# Patient Record
Sex: Male | Born: 1940
Health system: Southern US, Community
[De-identification: ages and names within clinical notes are randomized; demographics above are authoritative.]

## PROBLEM LIST (undated history)

## (undated) DIAGNOSIS — E781 Pure hyperglyceridemia: Secondary | ICD-10-CM

## (undated) DIAGNOSIS — Z8719 Personal history of other diseases of the digestive system: Secondary | ICD-10-CM

## (undated) DIAGNOSIS — I2510 Atherosclerotic heart disease of native coronary artery without angina pectoris: Secondary | ICD-10-CM

## (undated) DIAGNOSIS — N189 Chronic kidney disease, unspecified: Secondary | ICD-10-CM

## (undated) DIAGNOSIS — J189 Pneumonia, unspecified organism: Secondary | ICD-10-CM

## (undated) DIAGNOSIS — IMO0001 Reserved for inherently not codable concepts without codable children: Secondary | ICD-10-CM

## (undated) DIAGNOSIS — R011 Cardiac murmur, unspecified: Secondary | ICD-10-CM

## (undated) DIAGNOSIS — I10 Essential (primary) hypertension: Secondary | ICD-10-CM

## (undated) DIAGNOSIS — E785 Hyperlipidemia, unspecified: Secondary | ICD-10-CM

## (undated) DIAGNOSIS — G4730 Sleep apnea, unspecified: Secondary | ICD-10-CM

## (undated) DIAGNOSIS — I350 Nonrheumatic aortic (valve) stenosis: Secondary | ICD-10-CM

## (undated) DIAGNOSIS — E119 Type 2 diabetes mellitus without complications: Secondary | ICD-10-CM

## (undated) DIAGNOSIS — M1990 Unspecified osteoarthritis, unspecified site: Secondary | ICD-10-CM

## (undated) DIAGNOSIS — Z87442 Personal history of urinary calculi: Secondary | ICD-10-CM

## (undated) DIAGNOSIS — I739 Peripheral vascular disease, unspecified: Secondary | ICD-10-CM

## (undated) DIAGNOSIS — K219 Gastro-esophageal reflux disease without esophagitis: Secondary | ICD-10-CM

## (undated) DIAGNOSIS — I209 Angina pectoris, unspecified: Secondary | ICD-10-CM

## (undated) HISTORY — DX: Pure hyperglyceridemia: E78.1

## (undated) HISTORY — PX: CATARACT EXTRACTION: SUR2

## (undated) HISTORY — DX: Hypercalcemia: E83.52

## (undated) HISTORY — PX: OTHER SURGICAL HISTORY: SHX169

## (undated) HISTORY — DX: Nonrheumatic aortic (valve) stenosis: I35.0

## (undated) HISTORY — DX: Hyperlipidemia, unspecified: E78.5

## (undated) HISTORY — DX: Gastro-esophageal reflux disease without esophagitis: K21.9

## (undated) HISTORY — DX: Essential (primary) hypertension: I10

## (undated) HISTORY — PX: HERNIA REPAIR: SHX51

## (undated) HISTORY — DX: Sleep apnea, unspecified: G47.30

## (undated) HISTORY — PX: ROTATOR CUFF REPAIR: SHX139

## (undated) HISTORY — DX: Type 2 diabetes mellitus without complications: E11.9

## (undated) HISTORY — PX: VASECTOMY: SHX75

## (undated) HISTORY — DX: Chronic kidney disease, unspecified: N18.9

---
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
## 2004-02-06 ENCOUNTER — Ambulatory Visit (HOSPITAL_COMMUNITY): Admission: RE | Admit: 2004-02-06 | Discharge: 2004-02-06 | Payer: Self-pay | Admitting: Gastroenterology

## 2004-03-19 ENCOUNTER — Ambulatory Visit (HOSPITAL_COMMUNITY): Admission: RE | Admit: 2004-03-19 | Discharge: 2004-03-19 | Payer: Self-pay | Admitting: General Surgery

## 2006-07-08 ENCOUNTER — Encounter: Admission: RE | Admit: 2006-07-08 | Discharge: 2006-07-08 | Payer: Self-pay | Admitting: Internal Medicine

## 2007-08-12 ENCOUNTER — Ambulatory Visit (HOSPITAL_COMMUNITY): Admission: RE | Admit: 2007-08-12 | Discharge: 2007-08-12 | Payer: Self-pay | Admitting: Family Medicine

## 2008-11-05 ENCOUNTER — Encounter: Admission: RE | Admit: 2008-11-05 | Discharge: 2008-11-05 | Payer: Self-pay | Admitting: Internal Medicine

## 2010-07-09 ENCOUNTER — Encounter: Admission: RE | Admit: 2010-07-09 | Discharge: 2010-07-09 | Payer: Self-pay | Admitting: Orthopedic Surgery

## 2010-07-15 ENCOUNTER — Ambulatory Visit (HOSPITAL_BASED_OUTPATIENT_CLINIC_OR_DEPARTMENT_OTHER): Admission: RE | Admit: 2010-07-15 | Discharge: 2010-07-15 | Payer: Self-pay | Admitting: Orthopedic Surgery

## 2011-02-04 LAB — POCT I-STAT 4, (NA,K, GLUC, HGB,HCT)
Glucose, Bld: 123 mg/dL — ABNORMAL HIGH (ref 70–99)
HCT: 43 % (ref 39.0–52.0)
Sodium: 139 mEq/L (ref 135–145)

## 2011-04-09 NOTE — Op Note (Signed)
NAME:  PRAVEEN, COIA                       ACCOUNT NO.:  [id number]   MEDICAL RECORD NO.:  [id number]                   PATIENT TYPE:  AMB   LOCATION:  ENDO                                 FACILITY:  [facility]   PHYSICIAN:  Danise Edge, M.D.                DATE OF BIRTH:  1941-09-19   DATE OF PROCEDURE:  02/06/2004  DATE OF DISCHARGE:                                 OPERATIVE REPORT   PROCEDURE:  Diagnostic colonoscopy.   INDICATIONS:  Mr. Therin Vetsch is a 70 year old male, born March 02, 1941.  Mr. Zawadzki underwent his health maintenance physical examination performed  by Dr. Ike Bene.  Mr. Couper stool was guaiac positive by  digital rectal examination.  Mr. Treichler was treated for acute  diverticulitis approximately three years ago.  He has had no further bouts  of diverticulitis.  There is no personal or family history of colon cancer.  He denies gastrointestinal bleeding.   ENDOSCOPIST:  Danise Edge, M.D.   PREMEDICATION:  Versed 7.5 mg, Demerol 50 mg.   DESCRIPTION OF PROCEDURE:  After obtaining informed consent, Mr. Negro  was placed in the left lateral decubitus position.  I administered  intravenous Demerol and intravenous Versed to achieve conscious sedation for  the procedure.  The patient's blood pressure, oxygen saturation and cardiac  rhythm were monitored throughout the procedure and documented in the medical  record.   Anal inspection was normal.  Digital rectal exam revealed a nonnondular  prostate.  The Olympus adjustable pediatric colonoscope was introduced into  the rectum and advanced to the cecum.  Colonic preparation for the exam  today was excellent.   Mr. Yanik does have universal colonic diverticulosis.  Diverticula are  mostly present in the left colon.  There is no endoscopic evidence for the  presence of diverticulitis or diverticular stricture formation.   Rectum:  Normal.  Sigmoid colon and descending colon:   Normal.  Splenic flexure:  Normal.  Transverse colon:  Normal.  Hepatic flexure:  Normal.  Ascending colon:  Normal.  Cecum and ileocecal valve:  Normal.   ASSESSMENT:  1. Normal proctocolonoscopy to the cecum.  2. Universal colonic diverticulosis noted.                                              Danise Edge, M.D.   MJ/MEDQ  D:  02/06/2004  T:  02/06/2004  Job:  [id number]

## 2011-04-09 NOTE — Op Note (Signed)
NAME:  Daniel Mendez, Daniel Mendez                       ACCOUNT NO.:  [id number]   MEDICAL RECORD NO.:  [id number]                   PATIENT TYPE:  AMB   LOCATION:  DAY                                  FACILITY:  [facility]   PHYSICIAN:  Timothy E. Earlene Plater, M.D.              DATE OF BIRTH:  [birth year]   DATE OF PROCEDURE:  03/19/2004  DATE OF DISCHARGE:                                 OPERATIVE REPORT   PREOPERATIVE DIAGNOSES:  Right inguinal hernia.   POSTOPERATIVE DIAGNOSES:  Large right direct inguinal hernia.   PROCEDURE:  Operative repair of right inguinal hernia with mesh.   SURGEON:  Timothy E. Earlene Plater, M.D.   ANESTHESIA:  General.   [name] presents with a symptomatic right inguinal hernia that he  wishes to have repaired.  This was carefully discussed in the office and he  is ready to proceed.  Laboratory data is reviewed by anesthesia. The right  inguinal area is identified and a permit is signed.   The patient was taken to the operating room, placed supine, general  endotracheal anesthesia administered.  The right groin had been shaved, it  was prepped and draped in the usual fashion.  Marcaine 0.25% with  epinephrine was injected throughout for a wide field block.  An incision  made over the palpable defect of the right groin.  The ample subcutaneous  tissue dissected, bleeding points cauterized, the external oblique  identified, dissected, free, opened in line with its fibers through the  external ring. Additional Marcaine injected. The cord structures appeared  normal and were dissected from the floor of the canal which revealed a large  bulging direct inguinal hernia. The cord structures were skeletonized and  there were lipomatous structures which were carefully removed with cautery  and the cord was otherwise normal.  A large plug of mesh was placed in the  floor of the canal, this reduced the hernia. It was sutured in place with #0  Prolene and then a patch was placed over  the floor of the canal and sutured  with running 2-0 Prolene to the shelving edge of the external oblique  ligament and the transversalis and rectus sheath medially. This was carried  out above the internal ring.  The cord lay nicely, the floor was reinforced  and the procedure was complete.  The cord was replaced in its anatomic  position, the external oblique closed with a running 2-0 Vicryl, the  deep subcu 2-0 Vicryl and the skin with 4-0 Monocryl.  All counts correct,  Steri-Strips and dry sterile dressing applied.  He tolerated it well and was  awakened and taken to the recovery room in good condition. Written and  verbal instructions were given to him and his wife including Percocet #36, 5  mg.  He will be followed in the office.  Timothy E. Earlene Plater, M.D.    TED/MEDQ  D:  03/19/2004  T:  03/19/2004  Job:  [id number]   cc:   Ike Bene, M.D.  [address]  [place]  [place] [address]  Fax: [phone]

## 2011-09-02 LAB — CREATININE, SERUM: GFR calc non Af Amer: >60

## 2013-08-22 DIAGNOSIS — I350 Nonrheumatic aortic (valve) stenosis: Secondary | ICD-10-CM

## 2013-08-22 HISTORY — DX: Nonrheumatic aortic (valve) stenosis: I35.0

## 2013-09-13 ENCOUNTER — Other Ambulatory Visit (HOSPITAL_COMMUNITY): Payer: Self-pay | Admitting: Internal Medicine

## 2013-09-13 ENCOUNTER — Ambulatory Visit (HOSPITAL_COMMUNITY): Payer: Medicare Other | Attending: Internal Medicine | Admitting: Radiology

## 2013-09-13 DIAGNOSIS — E119 Type 2 diabetes mellitus without complications: Secondary | ICD-10-CM | POA: Insufficient documentation

## 2013-09-13 DIAGNOSIS — I359 Nonrheumatic aortic valve disorder, unspecified: Secondary | ICD-10-CM

## 2013-09-13 DIAGNOSIS — R011 Cardiac murmur, unspecified: Secondary | ICD-10-CM

## 2013-09-13 NOTE — Progress Notes (Signed)
Echocardiogram performed.  

## 2013-10-13 ENCOUNTER — Encounter: Payer: Self-pay | Admitting: Interventional Cardiology

## 2013-10-13 ENCOUNTER — Encounter: Payer: Self-pay | Admitting: *Deleted

## 2013-10-15 ENCOUNTER — Encounter: Payer: Self-pay | Admitting: Interventional Cardiology

## 2013-10-15 ENCOUNTER — Ambulatory Visit (INDEPENDENT_AMBULATORY_CARE_PROVIDER_SITE_OTHER): Payer: Medicare Other | Admitting: Interventional Cardiology

## 2013-10-15 ENCOUNTER — Encounter (INDEPENDENT_AMBULATORY_CARE_PROVIDER_SITE_OTHER): Payer: Self-pay

## 2013-10-15 VITALS — BP 158/80 | HR 61 | Ht 71.0 in | Wt 191.0 lb

## 2013-10-15 DIAGNOSIS — I10 Essential (primary) hypertension: Secondary | ICD-10-CM | POA: Insufficient documentation

## 2013-10-15 DIAGNOSIS — E785 Hyperlipidemia, unspecified: Secondary | ICD-10-CM

## 2013-10-15 DIAGNOSIS — R0600 Dyspnea, unspecified: Secondary | ICD-10-CM

## 2013-10-15 DIAGNOSIS — I359 Nonrheumatic aortic valve disorder, unspecified: Secondary | ICD-10-CM

## 2013-10-15 DIAGNOSIS — R0989 Other specified symptoms and signs involving the circulatory and respiratory systems: Secondary | ICD-10-CM

## 2013-10-15 DIAGNOSIS — E118 Type 2 diabetes mellitus with unspecified complications: Secondary | ICD-10-CM

## 2013-10-15 DIAGNOSIS — I350 Nonrheumatic aortic (valve) stenosis: Secondary | ICD-10-CM | POA: Insufficient documentation

## 2013-10-15 DIAGNOSIS — R0609 Other forms of dyspnea: Secondary | ICD-10-CM

## 2013-10-15 NOTE — Progress Notes (Signed)
Patient ID: Daniel Mendez, male   DOB: [birth year], 72 y.o.   MRN: [id number]   Date: 10/15/2013 ID: Daniel Mendez, DOB 16-Sep-1941, MRN [id number] PCP: Renford Dills, M.D. Reason: Murmur  ASSESSMENT;  1. Mild aortic stenosis, based upon the echocardiogram from November 2014 2. Calcified nodule on ventricular surface of the anterior mitral valve leaflet, uncertain etiology but appears benign. 3. Dyspnea on exertion 4. Hypertension 5. Hyperlipidemia 6. Diabetes mellitus, controlled  PLAN:  1. Repeat echo in one year unless symptoms. This will allow longitudinal followup of aortic stenosis and ensure stability of the nodule noted on the anterior mitral leaflet 2. Exercise treadmill test given history of exertional dyspnea, multiple risk factors including diabetes, and family history of CAD   SUBJECTIVE: Daniel Mendez is a 72 y.o. male who is referred because of a murmur and an abnormal echo report. The echo demonstrated mild aortic stenosis and also a small calcified anterior mitral leaflet nodule. The patient has no cardiopulmonary complaints with the exception of dyspnea on exertion with mild chest tightness while walking up an incline (first noted while walking to a football game at [facility] within the past 30 days). Rest led to improvement of the complaint. He has no prior history of heart disease. We performed an excise treadmill test greater than 10 years ago. He denies palpitations and syncope. Is no peripheral edema other complaints. He has never had sustained palpitations or syncope. No prior history of rheumatic fever heart murmur per   No Known Allergies  Current Outpatient Prescriptions on File Prior to Visit  Medication Sig Dispense Refill  . amLODipine (NORVASC) 10 MG tablet Take 1 tablet by mouth daily.      [name] Kitchen atorvastatin (LIPITOR) 20 MG tablet Take 1 tablet by mouth daily.      [name] Kitchen JANUVIA 50 MG tablet Take 1 tablet by mouth daily.      . metFORMIN (GLUCOPHAGE)  500 MG tablet Take 1 tablet by mouth daily.      [name] Kitchen omeprazole (PRILOSEC) 20 MG capsule Take 1 capsule by mouth daily.      [name] Kitchen telmisartan (MICARDIS) 80 MG tablet Take 1 tablet by mouth daily.       No current facility-administered medications on file prior to visit.    Past Medical History  Diagnosis Date  . Hyperglyceridemia   . Hypercalcemia   . Hyperlipidemia   . HTN (hypertension)   . Diabetes   . GERD (gastroesophageal reflux disease)   . Sleep apnea   . Kidney stone     Past Surgical History  Procedure Laterality Date  . Hernia repair b/l inguinal    . Knee arthroscopy left    . Dental implants      History   Social History  . Marital Status: Married    Spouse Name: N/A    Number of Children: N/A  . Years of Education: N/A   Occupational History  . Not on file.   Social History Main Topics  . Smoking status: Never Smoker   . Smokeless tobacco: Not on file  . Alcohol Use: Not on file  . Drug Use: Not on file  . Sexual Activity: Not on file   Other Topics Concern  . Not on file   Social History Narrative  . No narrative on file    Family History  Problem Relation Age of Onset  . CVA Mother   . Heart disease Father   . Heart disease Brother  ROS: Denies claudication. No transient neurological complaints. No history of stroke. Appetite and weight have been stable. He denies wheezing, cough, phlegm production, hemoptysis, and hematemesis.. Other systems negative for complaints.  OBJECTIVE: BP 158/80  Pulse 61  Ht 5\' 11"  (1.803 m)  Wt 191 lb (86.637 kg)  BMI 26.65 kg/m2,  General: No acute distress, healthy HEENT: normal  Neck: JVD absent. Carotids bilateral bruits transmitted from the aortic valve Chest: Faint basilar crackles Cardiac: Murmur: 2-3/6 systolic murmur left midsternal border with radiation to the right upper sternal border. No diastolic murmurs heard.. Gallop: S4. Rhythm: Regular. Other: Otherwise unremarkable Abdomen: Bruit:  Absent. Pulsation: Absent Extremities: Edema: Absent. Pulses: 2+ Neuro: Normal Psych: Normal  ECG: Normal sinus rhythm with nonspecific T wave flattening and PVCs.

## 2013-10-15 NOTE — Patient Instructions (Signed)
Your physician recommends that you continue on your current medications as directed. Please refer to the Current Medication list given to you today.  Your physician has requested that you have an exercise tolerance test. For further information please visit [email or web address] Please also follow instruction sheet, as given.  Your physician has requested that you have an echocardiogram. Echocardiography is a painless test that uses sound waves to create images of your heart. It provides your doctor with information about the size and shape of your heart and how well your heart's chambers and valves are working. This procedure takes approximately one hour. There are no restrictions for this procedure.( to be done in 1 year 2015)  Your physician wants you to follow-up in: 1 year You will receive a reminder letter in the mail two months in advance. If you don't receive a letter, please call our office to schedule the follow-up appointment.

## 2013-10-23 ENCOUNTER — Telehealth (HOSPITAL_COMMUNITY): Payer: Self-pay | Admitting: *Deleted

## 2013-10-24 ENCOUNTER — Ambulatory Visit (HOSPITAL_COMMUNITY)
Admission: RE | Admit: 2013-10-24 | Discharge: 2013-10-24 | Disposition: A | Discharge status: Home / Self Care | Payer: Medicare Other | Source: Ambulatory Visit | Attending: Cardiovascular Disease | Admitting: Cardiovascular Disease

## 2013-10-24 DIAGNOSIS — R5381 Other malaise: Secondary | ICD-10-CM | POA: Insufficient documentation

## 2013-10-24 DIAGNOSIS — R0600 Dyspnea, unspecified: Secondary | ICD-10-CM

## 2013-10-24 DIAGNOSIS — R0609 Other forms of dyspnea: Secondary | ICD-10-CM

## 2013-10-24 DIAGNOSIS — R0602 Shortness of breath: Secondary | ICD-10-CM | POA: Insufficient documentation

## 2013-10-24 DIAGNOSIS — R0989 Other specified symptoms and signs involving the circulatory and respiratory systems: Secondary | ICD-10-CM

## 2013-10-24 DIAGNOSIS — I4949 Other premature depolarization: Secondary | ICD-10-CM | POA: Insufficient documentation

## 2013-10-30 ENCOUNTER — Telehealth: Payer: Self-pay

## 2013-10-30 NOTE — Telephone Encounter (Signed)
called pt to give gxt  results.lmom. for pt to return call.

## 2013-10-30 NOTE — Telephone Encounter (Signed)
Message copied by Jarvis Newcomer on Tue Oct 30, 2013 11:56 AM ------      Message from: Verdis Prime      Created: Sat Oct 27, 2013 10:34 AM       Let him know the treadmill is borderline abnormal and I need to see him if increased symptoms or in 3 months depending on course. Call if questions. ------

## 2013-10-30 NOTE — Telephone Encounter (Signed)
Message copied by Jarvis Newcomer on Tue Oct 30, 2013  8:39 AM ------      Message from: Verdis Prime      Created: Sat Oct 27, 2013 10:34 AM       Let him know the treadmill is borderline abnormal and I need to see him if increased symptoms or in 3 months depending on course. Call if questions. ------

## 2013-10-30 NOTE — Telephone Encounter (Signed)
pt returned call. pt given gxt results.treadmill is borderline abnormal and  appt sch for 3 mo f/u.pt is to cal if experiencing chest pain, sob. pt verbalized understanding.

## 2014-01-28 ENCOUNTER — Encounter: Payer: Self-pay | Admitting: Interventional Cardiology

## 2014-01-28 ENCOUNTER — Ambulatory Visit (INDEPENDENT_AMBULATORY_CARE_PROVIDER_SITE_OTHER): Payer: Medicare Other | Admitting: Interventional Cardiology

## 2014-01-28 VITALS — BP 148/72 | HR 63 | Ht 71.0 in | Wt 191.4 lb

## 2014-01-28 DIAGNOSIS — R0609 Other forms of dyspnea: Secondary | ICD-10-CM

## 2014-01-28 DIAGNOSIS — R0600 Dyspnea, unspecified: Secondary | ICD-10-CM

## 2014-01-28 DIAGNOSIS — I359 Nonrheumatic aortic valve disorder, unspecified: Secondary | ICD-10-CM

## 2014-01-28 DIAGNOSIS — E785 Hyperlipidemia, unspecified: Secondary | ICD-10-CM

## 2014-01-28 DIAGNOSIS — R0989 Other specified symptoms and signs involving the circulatory and respiratory systems: Secondary | ICD-10-CM

## 2014-01-28 DIAGNOSIS — I209 Angina pectoris, unspecified: Secondary | ICD-10-CM

## 2014-01-28 DIAGNOSIS — I10 Essential (primary) hypertension: Secondary | ICD-10-CM

## 2014-01-28 MED ORDER — METOPROLOL SUCCINATE ER 25 MG PO TB24: 25.0000 mg | ORAL_TABLET | Freq: Every day | ORAL | Status: DC

## 2014-01-28 NOTE — Patient Instructions (Signed)
Your physician has recommended you make the following change in your medication:  1) START Metoprolol 25mg  daily. An Rx has been sent to your pharmacy  Take all other medication as prescribed  You have a follow up appt on 02/26/14 @3 :15pm

## 2014-01-28 NOTE — Progress Notes (Signed)
Patient ID: Daniel Mendez, male   DOB: [birth year], 73 y.o.   MRN: [id number]    [address], [address] [place],   [address] Phone: [phone] Fax:  [phone]  Date:  01/28/2014   ID:  Daniel Mendez, DOB 22-Jun-1941, MRN [id number]  PCP:  Kandice Hams, MD   ASSESSMENT:  1. Angina and dyspnea with late positive ETT 2. Hypertension 3. Hyperlipidemia  PLAN:  1. Metoprolol succ 25 mg  PO q day to treat possible underlying coronary disease 2. Clinical followup in 4-6 weeks to establish whether now metoprolol is helping clinical symptoms.. If we can abolish symptoms with medical therapy we will treat conservatively. Otherwise he may need coronary angiography   Daniel Mendez is a 73 y.o. male who had a late borderline positive exercise treadmill test beyond 9 minutes. He is also noted occasional left arm swelling. He denies significant chest pain and dyspnea with typical activity. He walks on his treadmill on a regular basis. There is occasional late exercise tightness on the treadmill and also with heavy physical activity. This complaint has been present for less than a year. This complaint is why the exercise treadmill test was performed.   Wt Readings from Last 3 Encounters:  01/28/14 191 lb 6.4 oz (86.818 kg)  10/15/13 191 lb (86.637 kg)     Past Medical History  Diagnosis Date  . Hyperglyceridemia   . Hypercalcemia   . Hyperlipidemia   . HTN (hypertension)   . Diabetes   . GERD (gastroesophageal reflux disease)   . Sleep apnea   . Kidney stone     Current Outpatient Prescriptions  Medication Sig Dispense Refill  . amLODipine (NORVASC) 10 MG tablet Take 1 tablet by mouth daily.      [name] Kitchen atorvastatin (LIPITOR) 20 MG tablet Take 1 tablet by mouth daily.      . chlorhexidine (PERIDEX) 0.12 % solution Use as directed in the mouth or throat as needed.       [name] Kitchen JANUVIA 50 MG tablet Take 1 tablet by mouth daily.      . metFORMIN (GLUCOPHAGE) 500 MG  tablet Take 1 tablet by mouth 4 (four) times daily.       [name] Kitchen omeprazole (PRILOSEC) 20 MG capsule Take 1 capsule by mouth every other day.       . telmisartan (MICARDIS) 80 MG tablet Take 1 tablet by mouth daily.       No current facility-administered medications for this visit.    Allergies:   No Known Allergies  Social History:  The patient  reports that he has never smoked. He does not have any smokeless tobacco history on file. He reports that he does not drink alcohol or use illicit drugs.   ROS:  Please see the history of present illness.      All other systems reviewed and negative.   OBJECTIVE: VS:  BP 148/72  Pulse 63  Ht 5\' 11"  (1.803 m)  Wt 191 lb 6.4 oz (86.818 kg)  BMI 26.71 kg/m2  SpO2 94% Well nourished, well developed, in no acute distress,  HEENT: normal Neck: JVD flat. Carotid bruit absent  Cardiac:  normal S1, S2; RRR; there is a 4-1/2 systolic  murmur Lungs:  clear to auscultation bilaterally, no wheezing, rhonchi or rales Abd: soft, nontender, no hepatomegaly Ext: Edema absent. Pulses 2+ Skin: warm and dry Neuro:  CNs 2-12 intact, no focal abnormalities noted  EKG:  Not repeated  Signed, Illene Labrador III, MD 01/28/2014 12:31 PM

## 2014-02-26 ENCOUNTER — Ambulatory Visit (INDEPENDENT_AMBULATORY_CARE_PROVIDER_SITE_OTHER): Payer: Medicare Other | Admitting: Interventional Cardiology

## 2014-02-26 ENCOUNTER — Encounter: Payer: Self-pay | Admitting: Interventional Cardiology

## 2014-02-26 VITALS — BP 156/75 | HR 64 | Ht 71.0 in | Wt 189.0 lb

## 2014-02-26 DIAGNOSIS — E785 Hyperlipidemia, unspecified: Secondary | ICD-10-CM

## 2014-02-26 DIAGNOSIS — R0989 Other specified symptoms and signs involving the circulatory and respiratory systems: Secondary | ICD-10-CM

## 2014-02-26 DIAGNOSIS — R0600 Dyspnea, unspecified: Secondary | ICD-10-CM

## 2014-02-26 DIAGNOSIS — I10 Essential (primary) hypertension: Secondary | ICD-10-CM

## 2014-02-26 DIAGNOSIS — I359 Nonrheumatic aortic valve disorder, unspecified: Secondary | ICD-10-CM

## 2014-02-26 DIAGNOSIS — R0609 Other forms of dyspnea: Secondary | ICD-10-CM

## 2014-02-26 DIAGNOSIS — R9439 Abnormal result of other cardiovascular function study: Secondary | ICD-10-CM

## 2014-02-26 NOTE — Progress Notes (Signed)
Patient ID: Daniel Mendez, male   DOB: 10/03/1941, 73 y.o.   MRN: [id number]    [address], [address] [place], [place]  [address] Phone: [phone] Fax:  [phone]  Date:  02/26/2014   ID:  Daniel Mendez, DOB 16-Dec-1940, MRN [id number]  PCP:  Kandice Hams, MD   ASSESSMENT:  1. Late abnormal exercise treadmill test 2. Mild aortic stenosis 3. Hyperlipidemia, controlled 4. Hypertension, controlled  PLAN:  1. The addition of beta blocker therapy cycle side effects. He denies dyspnea and has had no exertional chest pain. 2. Longitudinal followup of the aortic valve 3. Denies syncope, angina, and dyspnea   SUBJECTIVE: Daniel Mendez is a 73 y.o. male who is doing well. No side effects and starting low-dose beta blocker therapy. He performs vigorous activity  without complaints of shortness of breath or chest discomfort. No side effects on low-dose beta blocker therapy. He has not had syncope.  Wt Readings from Last 3 Encounters:  02/26/14 189 lb (85.73 kg)  01/28/14 191 lb 6.4 oz (86.818 kg)  10/15/13 191 lb (86.637 kg)     Past Medical History  Diagnosis Date  . Hyperglyceridemia   . Hypercalcemia   . Hyperlipidemia   . HTN (hypertension)   . Diabetes   . GERD (gastroesophageal reflux disease)   . Sleep apnea   . Kidney stone     Current Outpatient Prescriptions  Medication Sig Dispense Refill  . amLODipine (NORVASC) 10 MG tablet Take 1 tablet by mouth daily.      [name] Kitchen atorvastatin (LIPITOR) 20 MG tablet Take 1 tablet by mouth daily.      . chlorhexidine (PERIDEX) 0.12 % solution Use as directed in the mouth or throat as needed.       [name] Kitchen JANUVIA 50 MG tablet Take 1 tablet by mouth daily.      . metFORMIN (GLUCOPHAGE) 500 MG tablet Take 1 tablet by mouth 4 (four) times daily.       . metoprolol succinate (TOPROL XL) 25 MG 24 hr tablet Take 1 tablet (25 mg total) by mouth daily.  30 tablet  11  . omeprazole (PRILOSEC) 20 MG capsule Take 1 capsule by  mouth every other day.       . sildenafil (VIAGRA) 100 MG tablet Take 100 mg by mouth daily as needed for erectile dysfunction.      [name] Kitchen telmisartan (MICARDIS) 80 MG tablet Take 1 tablet by mouth daily.       No current facility-administered medications for this visit.    Allergies:   No Known Allergies  Social History:  The patient  reports that he has never smoked. He does not have any smokeless tobacco history on file. He reports that he does not drink alcohol or use illicit drugs.   ROS:  Please see the history of present illness.   No medication side effect   All other systems reviewed and negative.   OBJECTIVE: VS:  BP 156/75  Pulse 64  Ht 5\' 11"  (1.803 m)  Wt 189 lb (85.73 kg)  BMI 26.37 kg/m2 Well nourished, well developed, in no acute distress, younger than stated age 70: normal Neck: JVD flat. Carotid bruit absent  Cardiac:  normal S1, S2; RRR; no murmur Lungs:  clear to auscultation bilaterally, no wheezing, rhonchi or rales Abd: soft, nontender, no hepatomegaly Ext: Edema absent. Pulses 2+ and symmetric Skin: warm and dry Neuro:  CNs 2-12 intact, no focal abnormalities noted  EKG:  Not repeated       Signed, Illene Labrador III, MD 02/26/2014 4:18 PM

## 2014-02-26 NOTE — Patient Instructions (Signed)
Your physician recommends that you continue on your current medications as directed. Please refer to the Current Medication list given to you today.  Your physician wants you to follow-up in: 6-9 months You will receive a reminder letter in the mail two months in advance. If you don't receive a letter, please call our office to schedule the follow-up appointment.  

## 2014-05-22 ENCOUNTER — Ambulatory Visit (INDEPENDENT_AMBULATORY_CARE_PROVIDER_SITE_OTHER): Payer: Medicare Other | Admitting: Podiatry

## 2014-05-22 ENCOUNTER — Encounter: Payer: Self-pay | Admitting: Podiatry

## 2014-05-22 ENCOUNTER — Ambulatory Visit (INDEPENDENT_AMBULATORY_CARE_PROVIDER_SITE_OTHER): Payer: Medicare Other

## 2014-05-22 VITALS — BP 142/70 | HR 56 | Resp 16 | Ht 70.0 in | Wt 183.0 lb

## 2014-05-22 DIAGNOSIS — M779 Enthesopathy, unspecified: Secondary | ICD-10-CM

## 2014-05-22 DIAGNOSIS — M79609 Pain in unspecified limb: Secondary | ICD-10-CM

## 2014-05-22 NOTE — Progress Notes (Signed)
   Subjective:    Patient ID: Daniel Mendez, male    DOB: [birth year], 73 y.o.   MRN: [id number]  HPI Comments: i have left foot pain under my toes. i have had the pain for a couple of months. Its remained the same. When i wear shoes it hurts, so i wear sandals. The pain gets worse as the day goes on with enclosed shoes. i havent done anything for my foot.  Foot Pain      Review of Systems  HENT:       Hearing loss Ringing in ears   Genitourinary: Positive for frequency.  All other systems reviewed and are negative.      Objective:   Physical Exam: I have reviewed his past medical history medications allergies surgeries social history and review of systems. Pulses are strongly palpable bilateral. He has pain on palpation to the second metatarsophalangeal joint of the left foot. No other osseous abnormalities are noted other than limitation on range of motion of the first metatarsophalangeal joint left foot. Radiographic evaluation does demonstrate an elongated second metatarsal mild flexible hammertoe deformities as well as a joint space narrowing of the first metatarsophalangeal joint.        Assessment & Plan:  Assessment: Capsulitis second metatarsophalangeal joint left foot. 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  Plan: Injection second metatarsophalangeal joint with Kenalog and local anesthetic after sterile Betadine skin prep followup with him as needed discussed appropriate shoe gear stretching sizes and ice therapy as well as a carbon insert for his shoes.

## 2014-08-09 ENCOUNTER — Other Ambulatory Visit: Payer: Self-pay | Admitting: Gastroenterology

## 2014-08-26 ENCOUNTER — Ambulatory Visit (INDEPENDENT_AMBULATORY_CARE_PROVIDER_SITE_OTHER): Payer: Medicare Other | Admitting: Interventional Cardiology

## 2014-08-26 ENCOUNTER — Encounter: Payer: Self-pay | Admitting: Interventional Cardiology

## 2014-08-26 VITALS — BP 140/80 | HR 50 | Ht 71.0 in | Wt 189.4 lb

## 2014-08-26 DIAGNOSIS — I209 Angina pectoris, unspecified: Secondary | ICD-10-CM | POA: Insufficient documentation

## 2014-08-26 DIAGNOSIS — R0609 Other forms of dyspnea: Secondary | ICD-10-CM

## 2014-08-26 DIAGNOSIS — R0600 Dyspnea, unspecified: Secondary | ICD-10-CM

## 2014-08-26 DIAGNOSIS — I350 Nonrheumatic aortic (valve) stenosis: Secondary | ICD-10-CM

## 2014-08-26 DIAGNOSIS — I10 Essential (primary) hypertension: Secondary | ICD-10-CM

## 2014-08-26 NOTE — Progress Notes (Signed)
Patient ID: Daniel Mendez, male   DOB: 12-26-1940, 73 y.o.   MRN: [id number]    [address], [address] [place], [place]  [address] Phone: [phone] Fax:  [phone]  Date:  08/26/2014   ID:  DONNA SILVERMAN, DOB 01-09-1941, MRN [id number]  PCP:  Kandice Hams, MD   ASSESSMENT:  1. Mild to moderate aortic stenosis, asymptomatic 2. Angina pectoris with late positive exercise treadmill test. No worsening of symptomatology and tolerating beta blocker therapy without difficulty. 3. Hypertension, controlled 4. Diabetes mellitus, uncomplicated  PLAN:  1. Physical activity and exercise below the threshold angina 2. Cautioned to call if change in anginal pattern, dyspnea, or syncope. 3. 2-D Doppler echocardiogram in one year 4. Clinical followup in one year   SUBJECTIVE: Daniel Mendez is a 73 y.o. male was tolerating metoprolol therapy without significant side effects. He moves the dose to a.m. and now has no difficulty. He is able to the [employer] all of his chores and exercise without any significant change in tolerance. He has not had syncope, angina at rest, dyspnea on exertion, orthopnea. He denies edema.    Wt Readings from Last 3 Encounters:  08/26/14 189 lb 6.4 oz (85.911 kg)  05/22/14 183 lb (83.008 kg)  02/26/14 189 lb (85.73 kg)     Past Medical History  Diagnosis Date  . Hyperglyceridemia   . Hypercalcemia   . Hyperlipidemia   . HTN (hypertension)   . Diabetes   . GERD (gastroesophageal reflux disease)   . Sleep apnea   . Kidney stone     Current Outpatient Prescriptions  Medication Sig Dispense Refill  . amLODipine (NORVASC) 10 MG tablet Take 1 tablet by mouth daily.      [name] Kitchen atorvastatin (LIPITOR) 20 MG tablet Take 1 tablet by mouth daily.      Hinda Kehr 30 MG/ACT SOLN       . chlorhexidine (PERIDEX) 0.12 % solution Use as directed in the mouth or throat as needed.       . cyclobenzaprine (FLEXERIL) 5 MG tablet Take 5 mg by mouth 3 (three) times  daily as needed for muscle spasms.      [name] Kitchen GAVILYTE-N WITH FLAVOR PACK 420 G solution       . JANUVIA 50 MG tablet Take 1 tablet by mouth daily.      . metFORMIN (GLUCOPHAGE) 500 MG tablet Take 1 tablet by mouth 4 (four) times daily.       . metoprolol succinate (TOPROL XL) 25 MG 24 hr tablet Take 1 tablet (25 mg total) by mouth daily.  30 tablet  11  . omeprazole (PRILOSEC) 20 MG capsule Take 1 capsule by mouth every other day.       . sildenafil (VIAGRA) 100 MG tablet Take 100 mg by mouth daily as needed for erectile dysfunction.      [name] Kitchen telmisartan (MICARDIS) 80 MG tablet Take 1 tablet by mouth daily.       No current facility-administered medications for this visit.    Allergies:   No Known Allergies  Social History:  The patient  reports that he has never smoked. He does not have any smokeless tobacco history on file. He reports that he does not drink alcohol or use illicit drugs.   ROS:  Please see the history of present illness.   No claudication, no transient neurological symptoms, denies palpitations.   All other systems reviewed and negative.   OBJECTIVE: VS:  BP 140/80  Pulse 50  Ht 5\' 11"  (1.803 m)  Wt 189 lb 6.4 oz (85.911 kg)  BMI 26.43 kg/m2 Well nourished, well developed, in no acute distress, compatible with age 38: normal Neck: JVD flat. Carotid bruit absent  Cardiac:  normal S1, S2; RRR; no murmur Lungs:  clear to auscultation bilaterally, no wheezing, rhonchi or rales Abd: soft, nontender, no hepatomegaly Ext: Edema absent. Pulses 2+ Skin: warm and dry Neuro:  CNs 2-12 intact, no focal abnormalities noted  EKG:  Not performed       Signed, Illene Labrador III, MD 08/26/2014 9:08 AM

## 2014-08-26 NOTE — Patient Instructions (Signed)
Your physician recommends that you continue on your current medications as directed. Please refer to the Current Medication list given to you today.  Your physician has requested that you have an echocardiogram. Echocardiography is a painless test that uses sound waves to create images of your heart. It provides your doctor with information about the size and shape of your heart and how well your heart's chambers and valves are working. This procedure takes approximately one hour. There are no restrictions for this procedure. ( To be scheduled in Sept 2016)  Physical  activity as tolerated. Call the office is you are having increased episodes of chest discomfort  Your physician wants you to follow-up in: 1 year You will receive a reminder letter in the mail two months in advance. If you don't receive a letter, please call our office to schedule the follow-up appointment.

## 2014-09-23 ENCOUNTER — Other Ambulatory Visit (HOSPITAL_COMMUNITY): Payer: Medicare Other

## 2014-09-25 ENCOUNTER — Ambulatory Visit
Admission: RE | Admit: 2014-09-25 | Discharge: 2014-09-25 | Disposition: A | Discharge status: Home / Self Care | Payer: Medicare Other | Source: Ambulatory Visit | Attending: Internal Medicine | Admitting: Internal Medicine

## 2014-09-25 ENCOUNTER — Other Ambulatory Visit: Payer: Self-pay | Admitting: Internal Medicine

## 2014-11-01 ENCOUNTER — Encounter (HOSPITAL_COMMUNITY): Payer: Self-pay | Admitting: *Deleted

## 2014-11-04 ENCOUNTER — Other Ambulatory Visit: Payer: Self-pay | Admitting: Gastroenterology

## 2014-11-19 ENCOUNTER — Ambulatory Visit (HOSPITAL_COMMUNITY): Payer: Medicare Other | Admitting: Anesthesiology

## 2014-11-19 ENCOUNTER — Encounter (HOSPITAL_COMMUNITY)
Admission: RE | Disposition: A | Discharge status: Home / Self Care | Payer: Self-pay | Source: Ambulatory Visit | Attending: Gastroenterology

## 2014-11-19 ENCOUNTER — Ambulatory Visit (HOSPITAL_COMMUNITY)
Admission: RE | Admit: 2014-11-19 | Discharge: 2014-11-19 | Disposition: A | Discharge status: Home / Self Care | Payer: Medicare Other | Source: Ambulatory Visit | Attending: Gastroenterology | Admitting: Gastroenterology

## 2014-11-19 ENCOUNTER — Encounter (HOSPITAL_COMMUNITY): Payer: Self-pay | Admitting: *Deleted

## 2014-11-19 DIAGNOSIS — D127 Benign neoplasm of rectosigmoid junction: Secondary | ICD-10-CM | POA: Insufficient documentation

## 2014-11-19 DIAGNOSIS — K219 Gastro-esophageal reflux disease without esophagitis: Secondary | ICD-10-CM | POA: Insufficient documentation

## 2014-11-19 DIAGNOSIS — Z87442 Personal history of urinary calculi: Secondary | ICD-10-CM | POA: Insufficient documentation

## 2014-11-19 DIAGNOSIS — K5730 Diverticulosis of large intestine without perforation or abscess without bleeding: Secondary | ICD-10-CM | POA: Diagnosis not present

## 2014-11-19 DIAGNOSIS — I10 Essential (primary) hypertension: Secondary | ICD-10-CM | POA: Insufficient documentation

## 2014-11-19 DIAGNOSIS — Z1211 Encounter for screening for malignant neoplasm of colon: Secondary | ICD-10-CM | POA: Insufficient documentation

## 2014-11-19 DIAGNOSIS — E780 Pure hypercholesterolemia: Secondary | ICD-10-CM | POA: Diagnosis not present

## 2014-11-19 DIAGNOSIS — G4733 Obstructive sleep apnea (adult) (pediatric): Secondary | ICD-10-CM | POA: Diagnosis not present

## 2014-11-19 DIAGNOSIS — E119 Type 2 diabetes mellitus without complications: Secondary | ICD-10-CM | POA: Diagnosis not present

## 2014-11-19 HISTORY — PX: COLONOSCOPY WITH PROPOFOL: SHX5780

## 2014-11-19 LAB — GLUCOSE, CAPILLARY: GLUCOSE-CAPILLARY: 140 mg/dL — AB (ref 70–99)

## 2014-11-19 SURGERY — COLONOSCOPY WITH PROPOFOL
Anesthesia: Monitor Anesthesia Care

## 2014-11-19 MED ORDER — KETAMINE HCL 10 MG/ML IJ SOLN
INTRAMUSCULAR | Status: DC | PRN
  Administered 2014-11-19: 20 mg via INTRAVENOUS

## 2014-11-19 MED ORDER — GLYCOPYRROLATE 0.2 MG/ML IJ SOLN
INTRAMUSCULAR | Status: AC
  Filled 2014-11-19: qty 1

## 2014-11-19 MED ORDER — ONDANSETRON HCL 4 MG/2ML IJ SOLN
INTRAMUSCULAR | Status: DC | PRN
  Administered 2014-11-19: 4 mg via INTRAVENOUS

## 2014-11-19 MED ORDER — ONDANSETRON HCL 4 MG/2ML IJ SOLN
INTRAMUSCULAR | Status: AC
  Filled 2014-11-19: qty 2

## 2014-11-19 MED ORDER — LACTATED RINGERS IV SOLN
INTRAVENOUS | Status: DC
  Administered 2014-11-19: 11:00:00 via INTRAVENOUS

## 2014-11-19 MED ORDER — FENTANYL CITRATE 0.05 MG/ML IJ SOLN: 25.0000 ug | INTRAMUSCULAR | Status: DC | PRN

## 2014-11-19 MED ORDER — GLYCOPYRROLATE 0.2 MG/ML IJ SOLN
INTRAMUSCULAR | Status: DC | PRN
  Administered 2014-11-19: 0.2 mg via INTRAVENOUS

## 2014-11-19 MED ORDER — PROPOFOL INFUSION 10 MG/ML OPTIME
INTRAVENOUS | Status: DC | PRN
  Administered 2014-11-19: 300 ug/kg/min via INTRAVENOUS

## 2014-11-19 MED ORDER — SODIUM CHLORIDE 0.9 % IV SOLN: INTRAVENOUS | Status: DC

## 2014-11-19 MED ORDER — LIDOCAINE HCL (CARDIAC) 20 MG/ML IV SOLN
INTRAVENOUS | Status: AC
  Filled 2014-11-19: qty 5

## 2014-11-19 MED ORDER — LIDOCAINE HCL (CARDIAC) 20 MG/ML IV SOLN
INTRAVENOUS | Status: DC | PRN
  Administered 2014-11-19: 100 mg via INTRAVENOUS

## 2014-11-19 MED ORDER — PROPOFOL 10 MG/ML IV BOLUS
INTRAVENOUS | Status: AC
  Filled 2014-11-19: qty 20

## 2014-11-19 SURGICAL SUPPLY — 22 items

## 2014-11-19 NOTE — Op Note (Signed)
Procedure: Screening colonoscopy. Normal screening colonoscopy performed on 02/06/2004  Endoscopist: Earle Gell  Premedication: Propofol administered by anesthesia  Procedure: The patient was placed in the left lateral decubitus position. Anal inspection and digital rectal exam were normal. The Pentax pediatric colonoscope was introduced into the rectum and advanced to the cecum. A normal-appearing appendiceal orifice was identified. A normal-appearing prominent ileocecal valve was identified. Colonic preparation for the exam today was good. Withdrawal time was 8 minutes  Universal colonic diverticulosis was present  Rectum. From the mid rectum, a 5 mm sessile polyp was removed with the cold snare and a 3 mm sessile polyp was removed with the cold biopsy forceps. Retroflexed view of the distal rectum was normal.  Sigmoid colon and descending colon. Normal  Splenic flexure. Normal  Transverse colon. Normal  Hepatic flexure. Normal  Ascending colon. Normal  Cecum and ileocecal valve. Normal.  Assessment:  #1. Universal colonic diverticulosis  #2.Two small polyps were removed from the mid rectum  Recommendation: I will review the rectal polyp pathology to determine if the patient should undergo a surveillance colonoscopy in 5 years

## 2014-11-19 NOTE — Anesthesia Postprocedure Evaluation (Signed)
  Anesthesia Post-op Note  Patient: Daniel Mendez  Procedure(s) Performed: Procedure(s) (LRB): COLONOSCOPY WITH PROPOFOL (N/A)  Patient Location: PACU  Anesthesia Type: MAC  Level of Consciousness: awake and alert   Airway and Oxygen Therapy: Patient Spontanous Breathing  Post-op Pain: mild  Post-op Assessment: Post-op Vital signs reviewed, Patient's Cardiovascular Status Stable, Respiratory Function Stable, Patent Airway and No signs of Nausea or vomiting  Last Vitals:  Filed Vitals:   11/19/14 1120  BP: 143/81  Pulse: 59  Temp:   Resp: 18    Post-op Vital Signs: stable   Complications: No apparent anesthesia complications

## 2014-11-19 NOTE — Anesthesia Preprocedure Evaluation (Addendum)
Anesthesia Evaluation  Patient identified by MRN, date of birth, ID band Patient awake    Reviewed: Allergy & Precautions, H&P , NPO status , Patient's Chart, lab work & pertinent test results, reviewed documented beta blocker date and time   Airway Mallampati: III  TM Distance: >3 FB Neck ROM: full    Dental  (+) Edentulous Upper, Edentulous Lower, Dental Advisory Given   Pulmonary shortness of breath and with exertion, sleep apnea ,  breath sounds clear to auscultation  Pulmonary exam normal       Cardiovascular Exercise Tolerance: Poor hypertension, Pt. on medications and Pt. on home beta blockers + angina + Valvular Problems/Murmurs AS Rhythm:regular Rate:Normal  Moderate AS   Neuro/Psych negative neurological ROS  negative psych ROS   GI/Hepatic negative GI ROS, Neg liver ROS, GERD-  Medicated and Controlled,  Endo/Other  diabetes, Well Controlled, Type 2, Oral Hypoglycemic Agents  Renal/GU negative Renal ROS  negative genitourinary   Musculoskeletal   Abdominal   Peds  Hematology negative hematology ROS (+)   Anesthesia Other Findings   Reproductive/Obstetrics negative OB ROS                            Anesthesia Physical Anesthesia Plan  ASA: III  Anesthesia Plan: MAC   Post-op Pain Management:    Induction:   Airway Management Planned: Simple Face Mask  Additional Equipment:   Intra-op Plan:   Post-operative Plan:   Informed Consent: I have reviewed the patients History and Physical, chart, labs and discussed the procedure including the risks, benefits and alternatives for the proposed anesthesia with the patient or authorized representative who has indicated his/her understanding and acceptance.   Dental Advisory Given  Plan Discussed with: CRNA and Surgeon  Anesthesia Plan Comments:         Anesthesia Quick Evaluation

## 2014-11-19 NOTE — H&P (Signed)
  Procedure: Repeat screening colonoscopy. Normal screening colonoscopy performed on 02/06/2004. Brother diagnosed with colon cancer at age 75  History: The patient is a 73 year old male born [birth year]. He is scheduled to undergo a screening colonoscopy with polypectomy to prevent colon cancer.  Past medical history: Bilateral inguinal herniorrhaphies. Left knee arthroscopy. Type 2 diabetes mellitus. Hypertension. Hypercholesterolemia. Obstructive sleep apnea syndrome. Hypogonadism. Kidney stones. Aortic valve stenosis.  Medication allergies: None  Exam: The patient is alert and lying comfortably on the endoscopy stretcher. Abdomen is soft and nontender to palpation. Lungs are clear to auscultation. Cardiac exam reveals a regular rhythm. Patient has aortic valve stenosis which is monitored by his cardiologist, Dr. Daneen Schick  Plan: Proceed with screening colonoscopy

## 2014-11-19 NOTE — Transfer of Care (Signed)
Immediate Anesthesia Transfer of Care Note  Patient: Daniel Mendez  Procedure(s) Performed: Procedure(s) (LRB): COLONOSCOPY WITH PROPOFOL (N/A)  Patient Location: PACU  Anesthesia Type: MAC  Level of Consciousness: sedated, patient cooperative and responds to stimulation  Airway & Oxygen Therapy: Patient Spontanous Breathing and Patient connected to face mask oxgen  Post-op Assessment: Report given to PACU RN and Post -op Vital signs reviewed and stable  Post vital signs: Reviewed and stable  Complications: No apparent anesthesia complications

## 2014-11-20 ENCOUNTER — Encounter (HOSPITAL_COMMUNITY): Payer: Self-pay | Admitting: Gastroenterology

## 2015-01-19 ENCOUNTER — Other Ambulatory Visit: Payer: Self-pay | Admitting: Interventional Cardiology

## 2015-02-11 ENCOUNTER — Ambulatory Visit (INDEPENDENT_AMBULATORY_CARE_PROVIDER_SITE_OTHER): Payer: Medicare Other | Admitting: Cardiology

## 2015-02-11 ENCOUNTER — Encounter: Payer: Self-pay | Admitting: Cardiology

## 2015-02-11 VITALS — BP 162/88 | HR 63 | Ht 71.0 in | Wt 186.1 lb

## 2015-02-11 DIAGNOSIS — R0989 Other specified symptoms and signs involving the circulatory and respiratory systems: Secondary | ICD-10-CM | POA: Insufficient documentation

## 2015-02-11 DIAGNOSIS — I10 Essential (primary) hypertension: Secondary | ICD-10-CM

## 2015-02-11 MED ORDER — HYDROCHLOROTHIAZIDE 12.5 MG PO CAPS: 12.5000 mg | ORAL_CAPSULE | Freq: Every day | ORAL | Status: DC

## 2015-02-11 NOTE — Assessment & Plan Note (Signed)
He is to see Dr Delfina Redwood this week

## 2015-02-11 NOTE — Assessment & Plan Note (Signed)
Mild to moderate by echo Oct 2014 with a MV nodule

## 2015-02-11 NOTE — Patient Instructions (Signed)
START TAKING  HCTZ 12.5 MG ONCE A DAY    Your physician has requested that you have a carotid duplex. This test is an ultrasound of the carotid arteries in your neck. It looks at blood flow through these arteries that supply the brain with blood. Allow one hour for this exam. There are no restrictions or special instructions.

## 2015-02-11 NOTE — Assessment & Plan Note (Signed)
Followed by Dr Delfina Redwood

## 2015-02-11 NOTE — Assessment & Plan Note (Signed)
No history of stroke or TIA

## 2015-02-11 NOTE — Progress Notes (Signed)
02/11/2015 Daniel Mendez   22-Sep-1941  [id number]  Primary Physician Kandice Hams, MD Primary Cardiologist: Dr [name]  HPI:  74 y.o. Male, referred to Dr [name] Nov 2014 by his PCP because of a murmur and an abnormal echo report. The echo demonstrated mild aortic stenosis and also a small calcified anterior mitral leaflet nodule. The patient has no cardiopulmonary complaints with the exception of dyspnea on exertion with mild chest tightness while walking on a treadmill. He usually does 20 minutes. He had a borderline abnormal treadmill in Dec 2014 but had done well on medical Rx. He is here today for 6 month check. He says Dr Delfina Redwood took him off Toprol secondary to fatigue. The pt admits he gets a little SOB by 20 minutes on the treadmill. He has had some chest tightness doing yard work but it is quickly relieved with rest. Overall he did not indicate a serious progression in his symptoms to me.    Current Outpatient Prescriptions  Medication Sig Dispense Refill  . amLODipine (NORVASC) 10 MG tablet Take 1 tablet by mouth at bedtime.     [name] Kitchen aspirin 81 MG chewable tablet Chew 81 mg by mouth daily.    [name] Kitchen atorvastatin (LIPITOR) 20 MG tablet Take 1 tablet by mouth daily.    Hinda Kehr 30 MG/ACT SOLN Apply 1 application topically daily.     . chlorhexidine (PERIDEX) 0.12 % solution Use as directed 10 mLs in the mouth or throat 2 (two) times daily as needed (Oral Pain).     . cyclobenzaprine (FLEXERIL) 5 MG tablet Take 5 mg by mouth 3 (three) times daily as needed for muscle spasms.    [name] Kitchen JANUVIA 100 MG tablet Take 100 mg by mouth daily.    . metFORMIN (GLUCOPHAGE) 500 MG tablet Take 1 tablet by mouth 4 (four) times daily.     [name] Kitchen omeprazole (PRILOSEC) 20 MG capsule Take 1 capsule by mouth every other day.     . sildenafil (VIAGRA) 100 MG tablet Take 100 mg by mouth daily as needed for erectile dysfunction.    [name] Kitchen telmisartan (MICARDIS) 80 MG tablet Take 1 tablet by mouth at bedtime.      No  current facility-administered medications for this visit.    No Known Allergies  History   Social History  . Marital Status: Married    Spouse Name: N/A  . Number of Children: N/A  . Years of Education: N/A   Occupational History  . Not on file.   Social History Main Topics  . Smoking status: Never Smoker   . Smokeless tobacco: Not on file  . Alcohol Use: Yes     Comment: rarely  . Drug Use: No  . Sexual Activity: Not on file   Other Topics Concern  . Not on file   Social History Narrative     Review of Systems: General: negative for chills, fever, night sweats or weight changes.  Cardiovascular: negative for chest pain, dyspnea on exertion, edema, orthopnea, palpitations, paroxysmal nocturnal dyspnea or shortness of breath Dermatological: negative for rash Respiratory: negative for cough or wheezing Urologic: negative for hematuria Abdominal: negative for nausea, vomiting, diarrhea, bright red blood per rectum, melena, or hematemesis Neurologic: negative for visual changes, syncope, or dizziness All other systems reviewed and are otherwise negative except as noted above.    Blood pressure 162/88, pulse 63, height 5\' 11"  (1.803 m), weight 186 lb 1.9 oz (84.423 kg).  General appearance: alert, cooperative and no distress  Neck: no JVD and Lt CA bruit, transmitted murmur to RtCA Lungs: clear to auscultation bilaterally Heart: regular rate and rhythm Extremities: no edema  EKG NSR  ASSESSMENT AND PLAN:   Essential hypertension, uncontrolled Repeat B/P by me 168/82   Diabetes mellitus type 2 with complications He is to see Dr Delfina Redwood this week   Aortic stenosis, moderate Mild to moderate by echo Oct 2014 with a MV nodule   Dyslipidemia Followed by Dr Delfina Redwood   Left carotid bruit No history of stroke or TIA    PLAN  I told him he could stay off Toprol, he thinks he feels better off it. We discussed typical angina symptoms. He'll let us know if he has  any acceleration of his symptoms, in that case he will probably need cath. I did add HCTZ 12.5 mg to his meds for HTN, he will follow up with his PCP later this week. He is scheduled for an Echo in Sept and a f/u with Dr [name] after this. I have asked for labs from  Dr Polite's office- renal function and lipids. I also ordered carotid dopplers, he has a transmitted murmur on the Rt but I believe he has a bruit on the Lt.   Charm Stenner KPA-C 02/11/2015 3:04 PM

## 2015-02-11 NOTE — Assessment & Plan Note (Signed)
Repeat B/P by me 168/82 

## 2015-02-17 ENCOUNTER — Encounter: Payer: Self-pay | Admitting: Endocrinology

## 2015-02-17 ENCOUNTER — Ambulatory Visit (INDEPENDENT_AMBULATORY_CARE_PROVIDER_SITE_OTHER): Payer: Medicare Other | Admitting: Endocrinology

## 2015-02-17 DIAGNOSIS — E1165 Type 2 diabetes mellitus with hyperglycemia: Secondary | ICD-10-CM

## 2015-02-17 DIAGNOSIS — IMO0002 Reserved for concepts with insufficient information to code with codable children: Secondary | ICD-10-CM

## 2015-02-17 DIAGNOSIS — R82994 Hypercalciuria: Secondary | ICD-10-CM

## 2015-02-17 NOTE — Progress Notes (Addendum)
Patient ID: Daniel Mendez, male   DOB: 09-12-1941, 74 y.o.   MRN: [id number]  Referring physician: Dr. Delfina Redwood  Chief complaint: High calcium  History of Present Illness:  Patient has been referred here for evaluation of persistent hypercalcemia   Review of records show that he has had a high calcium since at least 2012. His levels of calcium have been ranging from 10.3 up to 11.2 in the past. Most recent calcium in 3/16 was 10.9, normal <10.3.   Ionized calcium in 11/15 was 5.7, normal <5.6  The hypercalcemia is not associated with any pathologic fractures,  renal insufficiency, sarcoidosis, known carcinoma or thyroid disease He may have lost only little height since his youth, less than about 1 inch He did have a kidney stone about 35 years ago but none subsequently He has not been on any HCTZ or other diuretics  Prior serologic and radiologic studies have included: Chest x-ray in 11/15 which was normal PTH levels have been in the normal range since 2012 with the last level being 22 in 09/2014 He has had negative serum protein electrophoresis, PTH RP level, normal TSH and no renal dysfunction  25 (OH) Vitamin D level  low at 24.4 in 01/2015 but he is not on any supplements 24 urine calcium was 351 with adequate creatinine      Medication List       This list is accurate as of: 02/17/15 11:59 PM.  Always use your most recent med list.               amLODipine 10 MG tablet  Commonly known as:  NORVASC  Take 1 tablet by mouth at bedtime.     aspirin 81 MG chewable tablet  Chew 81 mg by mouth daily.     atorvastatin 20 MG tablet  Commonly known as:  LIPITOR  Take 1 tablet by mouth daily.     AXIRON 30 MG/ACT Soln  Generic drug:  Testosterone  Apply 1 application topically daily.     chlorhexidine 0.12 % solution  Commonly known as:  PERIDEX  Use as directed 10 mLs in the mouth or throat 2 (two) times daily as needed (Oral Pain).     cyclobenzaprine  5 MG tablet  Commonly known as:  FLEXERIL  Take 5 mg by mouth 3 (three) times daily as needed for muscle spasms.     JANUVIA 100 MG tablet  Generic drug:  sitaGLIPtin  Take 100 mg by mouth daily.     metFORMIN 500 MG tablet  Commonly known as:  GLUCOPHAGE  Take 1 tablet by mouth 4 (four) times daily.     omeprazole 20 MG capsule  Commonly known as:  PRILOSEC  Take 1 capsule by mouth every other day.     sildenafil 100 MG tablet  Commonly known as:  VIAGRA  Take 100 mg by mouth daily as needed for erectile dysfunction.     telmisartan 80 MG tablet  Commonly known as:  MICARDIS  Take 1 tablet by mouth at bedtime.        Allergies: No Known Allergies  Past Medical History  Diagnosis Date  . Hyperglyceridemia   . Hypercalcemia   . Hyperlipidemia   . HTN (hypertension)   . Diabetes   . GERD (gastroesophageal reflux disease)   . Kidney stone   . Sleep apnea     no cpap  . Mild aortic stenosis Oct 2014    Past Surgical History  Procedure  Laterality Date  . Hernia repair b/l inguinal    . Knee arthroscopy left    . Dental implants      dental post implanted to hold dentures/partials in place  . Colonoscopy with propofol N/A 11/19/2014    Procedure: COLONOSCOPY WITH PROPOFOL;  Surgeon: Garlan Fair, MD;  Location: [facility];  Service: Endoscopy;  Laterality: N/A;    Family History  Problem Relation Age of Onset  . CVA Mother   . Heart disease Father   . Heart disease Brother     Social History:  reports that he has never smoked. He does not have any smokeless tobacco history on file. He reports that he drinks alcohol. He reports that he does not use illicit drugs.  Review of Systems  Constitutional: Positive for malaise/fatigue. Negative for weight loss.       Had fatigue for about a week recently but this is better now  Eyes: Negative for blurred vision.  Respiratory: Negative for shortness of breath.   Cardiovascular: Negative for chest pain.    Gastrointestinal: Negative for abdominal pain.  Genitourinary:       No nocturia or frequency  Musculoskeletal: Negative for myalgias, back pain and joint pain.  Skin: Negative for rash.  Neurological: Negative for tingling and headaches.  Endo/Heme/Allergies: Negative for polydipsia.  Psychiatric/Behavioral: Negative for depression.    DIABETES: He has had diabetes for several years managed by PCP.  His last A1c was 7.9 with taking Januvia and metformin He is checking blood sugars only in the mornings and these are about 140.  His PCP indicated that he may be a candidate for insulin if blood sugars did not improve  He has history of erectile dysfunction  HYPERTENSION: Currently being treated with Micardis and amlodipine, beta blocker stopped because of bradycardia and mild dizziness  He has had testosterone deficiency treated with Axiron but no testosterone levels available from his lab records  EXAM:  BP 156/72 mmHg  Pulse 70  Temp(Src) 98 F (36.7 C)  Resp 14  Ht 5\' 11"  (1.803 m)  Wt 188 lb (85.276 kg)  BMI 26.23 kg/m2  SpO2 93%  GENERAL: Averagely built and nourished  No pallor, clubbing, lymphadenopathy or edema.  Skin:  no rash or pigmentation.  EYES:  Externally normal.  Cornea shows no pigmentary changes or opacification.  Fundii:  normal discs and vessels.  ENT: Oral mucosa and tongue normal.  THYROID:  Not palpable.  No lymphadenopathy in the neck  HEART:  Normal  S1 and S2; no murmur or click.  CHEST:  Normal shape Lungs:   Vescicular breath sounds heard equally.  No crepitations/ wheeze.  ABDOMEN:  No distention.  Liver and spleen not palpable.  No other mass or tenderness.  NEUROLOGICAL: .Reflexes are normal bilaterally at biceps.  SPINE AND JOINTS:  Normal appearance, no tenderness or deformity of the spine.   Assessment/Plan:   HYPERCALCEMIA:  Most likely has primary hyperparathyroidism since he has had very long-standing history of mild  hypercalcemia and PTH level at least recently has been over 20. He has not had any other evident causes for hypercalcemia evident on previous workup Since PTH level is not significantly suppressed would not consider another cause such as sarcoidosis  We will however check his 1, 25-hydroxy vitamin D which has not been performed  Discussed the nature of primary hyperparathyroidism as well as normal role of the parathyroid glands.  Discussed potential  effects of hyperparathyroidism long-term on bone health, kidney  stones and kidney function Explained to patient that surgery is indicated only there are symptoms of high calcium, a level over 1 point above the normal range or known osteoporosis. Also discussed that there is no indicator or proven medical remedy for hyperparathyroidism  He has not been evaluated for osteoporosis and his calcium level has been consistently within 1 mg percent of the upper limit He will need to have a bone density for evaluation and if it is low may need specific treatment for this Also can consider vitamin D supplementation if his 1, 25 hydroxy vitamin D is normal or low   His follow-up will be decided after his labs are available  DIABETES: He will be followed by PCP, currently blood sugars are not controlled with A1c 7.9 and he may need additional therapy to help with his control before starting insulin.  Also recommended he start monitoring postprandial readings  HYPERTENSION: Usually well controlled with PCP, blood pressure mildly increased today   Daniel Mendez 02/18/2015, 4:01 PM   Addendum:Bone density is borderline for osteoporosis with T score -2.3 at the hip. Also his active vitamin D level is normal but since dietary vitamin D level was previously low would recommend 2000 units of vitamin D3 orally To prevent further bone deterioration would recommend alendronate 35 mg weekly on empty stomach which would improve his bone strength.  Would like to repeat  BMP and vitamin D level in 3 months and bone density in one year, please schedule.  Fax labs and bone density to PCP

## 2015-02-18 ENCOUNTER — Encounter (HOSPITAL_COMMUNITY): Payer: Medicare Other

## 2015-02-19 ENCOUNTER — Telehealth: Payer: Self-pay | Admitting: Interventional Cardiology

## 2015-02-19 ENCOUNTER — Ambulatory Visit (HOSPITAL_COMMUNITY): Payer: Medicare Other | Attending: Cardiovascular Disease | Admitting: Cardiology

## 2015-02-19 DIAGNOSIS — I6523 Occlusion and stenosis of bilateral carotid arteries: Secondary | ICD-10-CM | POA: Diagnosis not present

## 2015-02-19 DIAGNOSIS — R0989 Other specified symptoms and signs involving the circulatory and respiratory systems: Secondary | ICD-10-CM | POA: Insufficient documentation

## 2015-02-19 DIAGNOSIS — I6522 Occlusion and stenosis of left carotid artery: Secondary | ICD-10-CM

## 2015-02-19 NOTE — Telephone Encounter (Signed)
Let the patient know that he has an 80% or greater stenosis in the left carotid and needs to be seen by vascular surgery. Make sure he is on aspirin 81 mg daily.

## 2015-02-19 NOTE — Progress Notes (Signed)
Carotid duplex performed   Results given to Dr.Smith, patient was not symptomatic and disc was sent with patient for consult- gmg

## 2015-02-20 ENCOUNTER — Ambulatory Visit (INDEPENDENT_AMBULATORY_CARE_PROVIDER_SITE_OTHER)
Admission: RE | Admit: 2015-02-20 | Discharge: 2015-02-20 | Disposition: A | Discharge status: Home / Self Care | Payer: Medicare Other | Source: Ambulatory Visit | Attending: Endocrinology | Admitting: Endocrinology

## 2015-02-20 DIAGNOSIS — Z13820 Encounter for screening for osteoporosis: Secondary | ICD-10-CM | POA: Diagnosis not present

## 2015-02-20 NOTE — Telephone Encounter (Signed)
Pt and pt wife aware of Carotid results.pt  has an 80% or greater stenosis in the left carotid and needs to be seen by vascular surgery. Pt is currently taking Asa 81mg  qd.adv them we will initiate the ref to [facility] a scheduler from our office will call the scheduled. They verbalized understanding.

## 2015-02-21 LAB — VITAMIN D 1,25 DIHYDROXY
VITAMIN D3 1, 25 (OH): 55 pg/mL
Vitamin D 1, 25 (OH)2 Total: 55 pg/mL
Vitamin D2 1, 25 (OH)2: <10 pg/mL

## 2015-02-26 ENCOUNTER — Telehealth: Payer: Self-pay | Admitting: Endocrinology

## 2015-02-26 ENCOUNTER — Other Ambulatory Visit: Payer: Self-pay | Admitting: *Deleted

## 2015-02-26 DIAGNOSIS — I6522 Occlusion and stenosis of left carotid artery: Secondary | ICD-10-CM

## 2015-02-26 NOTE — Telephone Encounter (Signed)
Bone density is borderline for osteoporosis. Also his active vitamin D level is normal but since dietary vitamin D level was previously low would recommend 2000 units of vitamin D3 orally To prevent further bone deterioration would recommend alendronate 35 mg weekly on empty stomach which would improve his bone strength.  Would like to repeat BMP and vitamin D level in 3 months and bone density in one year, please schedule.  Fax labs and bone density to PCP

## 2015-02-26 NOTE — Telephone Encounter (Signed)
Pt left VM; would like the results of the vit d test and the bone density test also would like to know the plan of treatment and when to make another appt.

## 2015-02-27 NOTE — Telephone Encounter (Signed)
Left message on machine for patient returning his call. 

## 2015-02-28 ENCOUNTER — Telehealth: Payer: Self-pay | Admitting: Endocrinology

## 2015-02-28 NOTE — Telephone Encounter (Signed)
Patient called and would like the results of his labs and bone density test   Please advise   In addition to results what is the doctors recommendation    Thank you

## 2015-03-03 ENCOUNTER — Encounter: Payer: Self-pay | Admitting: Vascular Surgery

## 2015-03-03 NOTE — Telephone Encounter (Signed)
Please read message below and advise.  

## 2015-03-03 NOTE — Telephone Encounter (Signed)
Pt needs results of labs and bone density test

## 2015-03-03 NOTE — Telephone Encounter (Signed)
This is a copy of previous telephone encounter information:     Bone density is borderline for osteoporosis. Also his active vitamin D level is normal but since dietary vitamin D level was previously low would recommend 2000 units of vitamin D3 orally To prevent further bone deterioration would recommend alendronate 35 mg weekly on empty stomach which would improve his bone strength.  Would like to repeat BMP and vitamin D level in 3 months and bone density in one year, please schedule. Fax labs and bone density to PCP

## 2015-03-04 ENCOUNTER — Encounter: Payer: Self-pay | Admitting: Vascular Surgery

## 2015-03-04 ENCOUNTER — Ambulatory Visit (HOSPITAL_COMMUNITY)
Admission: RE | Admit: 2015-03-04 | Discharge: 2015-03-04 | Disposition: A | Discharge status: Home / Self Care | Payer: Medicare Other | Source: Ambulatory Visit | Attending: Vascular Surgery | Admitting: Vascular Surgery

## 2015-03-04 ENCOUNTER — Other Ambulatory Visit: Payer: Self-pay

## 2015-03-04 ENCOUNTER — Ambulatory Visit (INDEPENDENT_AMBULATORY_CARE_PROVIDER_SITE_OTHER): Payer: Medicare Other | Admitting: Vascular Surgery

## 2015-03-04 VITALS — BP 138/70 | HR 52 | Resp 18 | Ht 70.0 in | Wt 185.0 lb

## 2015-03-04 DIAGNOSIS — I6522 Occlusion and stenosis of left carotid artery: Secondary | ICD-10-CM | POA: Diagnosis present

## 2015-03-04 NOTE — Telephone Encounter (Signed)
Called pt and spoke with pt's wife. She voiced understanding of the message below. She will discuss the alendronate with pt and he will call back. She voiced concern about pt's high calcium level (due to potential parathyroid problem or tumor), she would like Dr Dwyane Dee to advise how he plans to address this issue. Also, pt has had a test done and his carotid artery is 80% blocked and they will be addressing this issue as soon as possible. Please advise concerning the high calcium level.

## 2015-03-04 NOTE — Progress Notes (Signed)
Patient name: Daniel Mendez MRN: [id number] DOB: [birth year] Sex: male   Referred by: Dwyane Dee  Reason for referral:  Chief Complaint  Patient presents with  . New Evaluation    carotid stenosis left    HISTORY OF PRESENT ILLNESS: The patient resents today for discussion of severe asymptomatic left carotid stenosis. He was found to have carotid bruit on his ankle exam and underwent subsequent duplex showing a high-grade left internal carotid artery stenosis and no significant stenosis on the right he is seen today for further discussion of this. The patient is left-handed. He specifically denies any prior episodes of aphasia, amaurosis fugax, transient ischemic attack or stroke. Does have a history of aortic stenosis. No coronary disease. Is being treated for hypercalcemia as well.  Past Medical History  Diagnosis Date  . Hyperglyceridemia   . Hypercalcemia   . Hyperlipidemia   . HTN (hypertension)   . Diabetes   . GERD (gastroesophageal reflux disease)   . Kidney stone   . Sleep apnea     no cpap  . Mild aortic stenosis Oct 2014    Past Surgical History  Procedure Laterality Date  . Hernia repair b/l inguinal    . Knee arthroscopy left    . Dental implants      dental post implanted to hold dentures/partials in place  . Colonoscopy with propofol N/A 11/19/2014    Procedure: COLONOSCOPY WITH PROPOFOL;  Surgeon: Garlan Fair, MD;  Location: [facility];  Service: Endoscopy;  Laterality: N/A;    History   Social History  . Marital Status: Married    Spouse Name: N/A  . Number of Children: N/A  . Years of Education: N/A   Occupational History  . Not on file.   Social History Main Topics  . Smoking status: Never Smoker   . Smokeless tobacco: Not on file  . Alcohol Use: Yes     Comment: rarely  . Drug Use: No  . Sexual Activity: Not on file   Other Topics Concern  . Not on file   Social History Narrative    Family History  Problem Relation Age of  Onset  . CVA Mother   . Peripheral vascular disease Mother   . Heart disease Father   . Heart disease Brother   . Diabetes Brother   . Cancer Daughter     Allergies as of 03/04/2015  . (No Known Allergies)    Current Outpatient Prescriptions on File Prior to Visit  Medication Sig Dispense Refill  . aspirin 81 MG chewable tablet Chew 81 mg by mouth daily.    [name] Kitchen atorvastatin (LIPITOR) 20 MG tablet Take 1 tablet by mouth daily.    . chlorhexidine (PERIDEX) 0.12 % solution Use as directed 10 mLs in the mouth or throat 2 (two) times daily as needed (Oral Pain).     . cyclobenzaprine (FLEXERIL) 5 MG tablet Take 5 mg by mouth 3 (three) times daily as needed for muscle spasms.    [name] Kitchen JANUVIA 100 MG tablet Take 100 mg by mouth daily.    . metFORMIN (GLUCOPHAGE) 500 MG tablet Take 1 tablet by mouth 4 (four) times daily.     [name] Kitchen omeprazole (PRILOSEC) 20 MG capsule Take 1 capsule by mouth every other day.     . sildenafil (VIAGRA) 100 MG tablet Take 100 mg by mouth daily as needed for erectile dysfunction.    [name] Kitchen telmisartan (MICARDIS) 80 MG tablet Take 1 tablet by mouth at  bedtime.     [name] Kitchen amLODipine (NORVASC) 10 MG tablet Take 1 tablet by mouth at bedtime.     Hinda Kehr 30 MG/ACT SOLN Apply 1 application topically daily.      No current facility-administered medications on file prior to visit.     REVIEW OF SYSTEMS:  Positives indicated with an "X"  CARDIOVASCULAR:  [ ]  chest pain   [ ]  chest pressure   [ ]  palpitations   [ ]  orthopnea   [x ] dyspnea on exertion   [ ]  claudication   [ ]  rest pain   [ ]  DVT   [ ]  phlebitis PULMONARY:   [ ]  productive cough   [ ]  asthma   [ ]  wheezing NEUROLOGIC:   [ ]  weakness  [ ]  paresthesias  [ ]  aphasia  [ ]  amaurosis  [ ]  dizziness HEMATOLOGIC:   [ ]  bleeding problems   [ ]  clotting disorders MUSCULOSKELETAL:  [ ]  joint pain   [ ]  joint swelling GASTROINTESTINAL: [ ]   blood in stool  [ ]   hematemesis GENITOURINARY:  [ ]   dysuria  [ ]    hematuria PSYCHIATRIC:  [ ]  history of major depression INTEGUMENTARY:  [ ]  rashes  [ ]  ulcers CONSTITUTIONAL:  [ ]  fever   [ ]  chills  PHYSICAL EXAMINATION:  General: The patient is a well-nourished male, in no acute distress. Vital signs are BP 138/70 mmHg  Pulse 52  Resp 18  Ht 5\' 10"  (1.778 m)  Wt 185 lb (83.915 kg)  BMI 26.54 kg/m2 Pulmonary: There is a good air exchange bilaterally without wheezing or rales. Abdomen: Soft and non-tender with normal pitch bowel sounds. Musculoskeletal: There are no major deformities.  There is no significant extremity pain. Neurologic: No focal weakness or paresthesias are detected, Skin: There are no ulcer or rashes noted. Psychiatric: The patient has normal affect. Cardiovascular: There is a regular rate and rhythm with soft systolic murmur Carotid: Soft bruit on left and none on the right Pulse status 2+ radial, 2+ dorsalis pedis pulses bilaterally   [facility] Vascular Lab Studies:  Ordered and Independently Reviewed left carotid duplex in our office revealed the high-grade stenosis in the left internal carotid artery. Compared to this to the study from [facility] from [facility] also showing high-grade left internal carotid artery stenosis.  Impression and Plan:  Severe asymptomatic left internal carotid artery stenosis. This at length with patient and his wife present. Explained the risk of stroke or transient ischemic attack and had approximate 5% per year with a high-grade asymptomatic stenosis. With his excellent health I have recommended endarterectomy for reduction of stroke risk. I explained the procedure including less than 2% chance of stroke with surgery and also the very slight risk of cranial nerve injury. Explain the overnight hospitalization and the usual expected recovery. I understand wished to proceed as soon as possible. Surgeries been scheduled for 03/10/2015    Latonda Larrivee [facility]  [place] Office: [phone]

## 2015-03-04 NOTE — Telephone Encounter (Signed)
Called and lvm advising pt (and his wife) per Dr Ronnie Derby message. Advised them to call back and let us know about the rx for alendronate 35 mg.

## 2015-03-04 NOTE — Telephone Encounter (Signed)
No treatment needed for the high calcium since the only option would be to have parathyroid surgery.  This can be done if calcium goes over 11.5.  Only need to protect the bones

## 2015-03-05 ENCOUNTER — Telehealth: Payer: Self-pay | Admitting: Endocrinology

## 2015-03-05 NOTE — Progress Notes (Signed)
Thanks Dr Donnetta Hutching!  Kerin Ransom PA-C 03/05/2015 11:13 AM

## 2015-03-05 NOTE — Telephone Encounter (Signed)
Patient returning your call, about medication he did not want to take, he is a doctor Daniel Mendez patient, but he said he talked with you and you wanted him to call you back, please advise

## 2015-03-05 NOTE — Telephone Encounter (Signed)
Returned pt's call. Advised him to return call.

## 2015-03-06 ENCOUNTER — Encounter (HOSPITAL_COMMUNITY)
Admission: RE | Admit: 2015-03-06 | Discharge: 2015-03-06 | Disposition: A | Discharge status: Home / Self Care | Payer: Medicare Other | Source: Ambulatory Visit | Attending: Vascular Surgery | Admitting: Vascular Surgery

## 2015-03-06 ENCOUNTER — Other Ambulatory Visit (HOSPITAL_COMMUNITY): Payer: Self-pay | Admitting: *Deleted

## 2015-03-06 ENCOUNTER — Encounter (HOSPITAL_COMMUNITY): Payer: Self-pay

## 2015-03-06 DIAGNOSIS — Z01812 Encounter for preprocedural laboratory examination: Secondary | ICD-10-CM | POA: Diagnosis present

## 2015-03-06 DIAGNOSIS — I6522 Occlusion and stenosis of left carotid artery: Secondary | ICD-10-CM | POA: Insufficient documentation

## 2015-03-06 HISTORY — DX: Cardiac murmur, unspecified: R01.1

## 2015-03-06 HISTORY — DX: Personal history of other diseases of the digestive system: Z87.19

## 2015-03-06 HISTORY — DX: Unspecified osteoarthritis, unspecified site: M19.90

## 2015-03-06 HISTORY — DX: Reserved for inherently not codable concepts without codable children: IMO0001

## 2015-03-06 LAB — URINALYSIS, ROUTINE W REFLEX MICROSCOPIC
BILIRUBIN URINE: NEGATIVE
Glucose, UA: 500 mg/dL — AB
Hgb urine dipstick: NEGATIVE
Ketones, ur: 15 mg/dL — AB
Leukocytes, UA: NEGATIVE
Nitrite: NEGATIVE
PH: 5.5 (ref 5.0–8.0)
Protein, ur: NEGATIVE mg/dL
SPECIFIC GRAVITY, URINE: 1.029 (ref 1.005–1.030)
Urobilinogen, UA: 0.2 mg/dL (ref 0.0–1.0)

## 2015-03-06 LAB — SURGICAL PCR SCREEN
MRSA, PCR: NEGATIVE
Staphylococcus aureus: NEGATIVE

## 2015-03-06 LAB — COMPREHENSIVE METABOLIC PANEL
ALK PHOS: 71 U/L (ref 39–117)
ALT: 53 U/L (ref 0–53)
ANION GAP: 10 (ref 5–15)
AST: 38 U/L — ABNORMAL HIGH (ref 0–37)
Albumin: 4.3 g/dL (ref 3.5–5.2)
BUN: 16 mg/dL (ref 6–23)
CALCIUM: 10.6 mg/dL — AB (ref 8.4–10.5)
CO2: 26 mmol/L (ref 19–32)
Chloride: 102 mmol/L (ref 96–112)
Creatinine, Ser: 1.17 mg/dL (ref 0.50–1.35)
GFR, EST AFRICAN AMERICAN: 69 mL/min — AB (ref >90)
GFR, EST NON AFRICAN AMERICAN: 59 mL/min — AB (ref >90)
GLUCOSE: 96 mg/dL (ref 70–99)
Potassium: 4.7 mmol/L (ref 3.5–5.1)
Sodium: 138 mmol/L (ref 135–145)
Total Bilirubin: 0.5 mg/dL (ref 0.3–1.2)
Total Protein: 7.2 g/dL (ref 6.0–8.3)

## 2015-03-06 LAB — CBC
HCT: 43.2 % (ref 39.0–52.0)
Hemoglobin: 14.2 g/dL (ref 13.0–17.0)
MCH: 27.8 pg (ref 26.0–34.0)
MCHC: 32.9 g/dL (ref 30.0–36.0)
MCV: 84.7 fL (ref 78.0–100.0)
Platelets: 199 10*3/uL (ref 150–400)
RBC: 5.1 MIL/uL (ref 4.22–5.81)
RDW: 13.5 % (ref 11.5–15.5)
WBC: 6.8 10*3/uL (ref 4.0–10.5)

## 2015-03-06 LAB — TYPE AND SCREEN
ABO/RH(D): O POS
ANTIBODY SCREEN: NEGATIVE

## 2015-03-06 LAB — APTT: aPTT: 26 seconds (ref 24–37)

## 2015-03-06 LAB — PROTIME-INR
INR: 0.98 (ref 0.00–1.49)
Prothrombin Time: 13.1 seconds (ref 11.6–15.2)

## 2015-03-06 LAB — ABO/RH: ABO/RH(D): O POS

## 2015-03-06 NOTE — Progress Notes (Signed)
Pt denies any recent chest pain. States he does get sob with yard work or walking too fast. Lamona Curl, PA in March, 2016, Lurena Joiner was the one that ordered the carotid study and then sent pt to Dr. Donnetta Hutching.  Pt states his Hgb A1C has been aroung 7.8, daily blood sugars have never gotten up to 200.

## 2015-03-06 NOTE — Pre-Procedure Instructions (Signed)
Daniel Mendez  03/06/2015   Your procedure is scheduled on:  Monday, March 10, 2015 at 9:30 AM.   Report to [facility] Entrance "A" Admitting Office at 7:30 AM.   Call this number if you have problems the morning of surgery: [phone]               Any questions prior to day of surgery, please call [phone] between 8 & 4 PM.   Remember:   Do not eat food or drink liquids after midnight Sunday, 03/09/15.   Take these medicines the morning of surgery with A SIP OF WATER: Omeprazole (Prilosec), Tylenol - if needed              Stop Fish Oil as of today. Do NOT take any of your diabetic medications the morning of surgery.   Do not wear jewelry.  Do not wear lotions, powders, or cologne. You may wear deodorant.  Men may shave face and neck.  Do not bring valuables to the hospital.  [facility] is not responsible                  for any belongings or valuables.               Contacts, dentures or bridgework may not be worn into surgery.  Leave suitcase in the car. After surgery it may be brought to your room.  For patients admitted to the hospital, discharge time is determined by your                treatment team.               Special Instructions: [facility] - Preparing for Surgery  Before surgery, you can play an important role.  Because skin is not sterile, your skin needs to be as free of germs as possible.  You can reduce the number of germs on you skin by washing with CHG (chlorahexidine gluconate) soap before surgery.  CHG is an antiseptic cleaner which kills germs and bonds with the skin to continue killing germs even after washing.  Please DO NOT use if you have an allergy to CHG or antibacterial soaps.  If your skin becomes reddened/irritated stop using the CHG and inform your nurse when you arrive at Short Stay.  Do not shave (including legs and underarms) for at least 48 hours prior to the first CHG shower.  You may shave your face.  Please follow these  instructions carefully:   1.  Shower with CHG Soap the night before surgery and the                                morning of Surgery.  2.  If you choose to wash your hair, wash your hair first as usual with your       normal shampoo.  3.  After you shampoo, rinse your hair and body thoroughly to remove the                      Shampoo.  4.  Use CHG as you would any other liquid soap.  You can apply chg directly       to the skin and wash gently with scrungie or a clean washcloth.  5.  Apply the CHG Soap to your body ONLY FROM THE NECK DOWN.        Do  not use on open wounds or open sores.  Avoid contact with your eyes, ears, mouth and genitals (private parts).  Wash genitals (private parts) with your normal soap.  6.  Wash thoroughly, paying special attention to the area where your surgery        will be performed.  7.  Thoroughly rinse your body with warm water from the neck down.  8.  DO NOT shower/wash with your normal soap after using and rinsing off       the CHG Soap.  9.  Pat yourself dry with a clean towel.            10.  Wear clean pajamas.            11.  Place clean sheets on your bed the night of your first shower and do not        sleep with pets.  Day of Surgery  Do not apply any lotions the morning of surgery.  Please wear clean clothes to the hospital.     Please read over the following fact sheets that you were given: Pain Booklet, Coughing and Deep Breathing, Blood Transfusion Information, MRSA Information and Surgical Site Infection Prevention

## 2015-03-07 NOTE — Telephone Encounter (Signed)
Can you review the last few phone calls with Mr Hamme and call the pt?

## 2015-03-07 NOTE — Telephone Encounter (Signed)
Patient stated that he do not want to take the Alendronate

## 2015-03-07 NOTE — Telephone Encounter (Signed)
Please see below and advise, message left for patient to call back to discuss.

## 2015-03-09 MED ORDER — DEXTROSE 5 % IV SOLN
1.5000 g | INTRAVENOUS | Status: AC
  Administered 2015-03-10: 1.5 g via INTRAVENOUS
  Filled 2015-03-09: qty 1.5

## 2015-03-09 MED ORDER — SODIUM CHLORIDE 0.9 % IV SOLN: INTRAVENOUS | Status: DC

## 2015-03-09 MED ORDER — CHLORHEXIDINE GLUCONATE CLOTH 2 % EX PADS: 6.0000 | MEDICATED_PAD | Freq: Once | CUTANEOUS | Status: DC

## 2015-03-10 ENCOUNTER — Encounter (HOSPITAL_COMMUNITY): Payer: Self-pay | Admitting: *Deleted

## 2015-03-10 ENCOUNTER — Inpatient Hospital Stay (HOSPITAL_COMMUNITY): Payer: Medicare Other | Admitting: Anesthesiology

## 2015-03-10 ENCOUNTER — Encounter (HOSPITAL_COMMUNITY)
Admission: RE | Disposition: A | Discharge status: Home / Self Care | Payer: Self-pay | Source: Ambulatory Visit | Attending: Vascular Surgery

## 2015-03-10 ENCOUNTER — Inpatient Hospital Stay (HOSPITAL_COMMUNITY)
Admission: RE | Admit: 2015-03-10 | Discharge: 2015-03-11 | DRG: 039 | Disposition: A | Discharge status: Home / Self Care | Payer: Medicare Other | Source: Ambulatory Visit | Attending: Vascular Surgery | Admitting: Vascular Surgery

## 2015-03-10 DIAGNOSIS — I350 Nonrheumatic aortic (valve) stenosis: Secondary | ICD-10-CM | POA: Diagnosis present

## 2015-03-10 DIAGNOSIS — G4730 Sleep apnea, unspecified: Secondary | ICD-10-CM | POA: Diagnosis present

## 2015-03-10 DIAGNOSIS — E785 Hyperlipidemia, unspecified: Secondary | ICD-10-CM | POA: Diagnosis present

## 2015-03-10 DIAGNOSIS — I6522 Occlusion and stenosis of left carotid artery: Principal | ICD-10-CM | POA: Diagnosis present

## 2015-03-10 DIAGNOSIS — K219 Gastro-esophageal reflux disease without esophagitis: Secondary | ICD-10-CM | POA: Diagnosis present

## 2015-03-10 DIAGNOSIS — E119 Type 2 diabetes mellitus without complications: Secondary | ICD-10-CM | POA: Diagnosis present

## 2015-03-10 DIAGNOSIS — I10 Essential (primary) hypertension: Secondary | ICD-10-CM | POA: Diagnosis present

## 2015-03-10 DIAGNOSIS — Z79899 Other long term (current) drug therapy: Secondary | ICD-10-CM | POA: Diagnosis not present

## 2015-03-10 DIAGNOSIS — I2510 Atherosclerotic heart disease of native coronary artery without angina pectoris: Secondary | ICD-10-CM | POA: Diagnosis present

## 2015-03-10 DIAGNOSIS — Z7982 Long term (current) use of aspirin: Secondary | ICD-10-CM

## 2015-03-10 HISTORY — DX: Atherosclerotic heart disease of native coronary artery without angina pectoris: I25.10

## 2015-03-10 HISTORY — DX: Angina pectoris, unspecified: I20.9

## 2015-03-10 HISTORY — DX: Peripheral vascular disease, unspecified: I73.9

## 2015-03-10 HISTORY — PX: ENDARTERECTOMY: SHX5162

## 2015-03-10 LAB — CBC
HCT: 36.3 % — ABNORMAL LOW (ref 39.0–52.0)
Hemoglobin: 12.1 g/dL — ABNORMAL LOW (ref 13.0–17.0)
MCH: 28.2 pg (ref 26.0–34.0)
MCHC: 33.3 g/dL (ref 30.0–36.0)
MCV: 84.6 fL (ref 78.0–100.0)
PLATELETS: 156 10*3/uL (ref 150–400)
RBC: 4.29 MIL/uL (ref 4.22–5.81)
RDW: 13.6 % (ref 11.5–15.5)
WBC: 10.1 10*3/uL (ref 4.0–10.5)

## 2015-03-10 LAB — GLUCOSE, CAPILLARY
GLUCOSE-CAPILLARY: 182 mg/dL — AB (ref 70–99)
GLUCOSE-CAPILLARY: 114 mg/dL — AB (ref 70–99)
Glucose-Capillary: 113 mg/dL — ABNORMAL HIGH (ref 70–99)
Glucose-Capillary: 161 mg/dL — ABNORMAL HIGH (ref 70–99)

## 2015-03-10 LAB — CREATININE, SERUM
Creatinine, Ser: 1.08 mg/dL (ref 0.50–1.35)
GFR calc non Af Amer: 65 mL/min — ABNORMAL LOW (ref >90)
GFR, EST AFRICAN AMERICAN: 76 mL/min — AB (ref >90)

## 2015-03-10 SURGERY — ENDARTERECTOMY, CAROTID
Anesthesia: General | Site: Neck | Laterality: Left

## 2015-03-10 MED ORDER — NEOSTIGMINE METHYLSULFATE 10 MG/10ML IV SOLN
INTRAVENOUS | Status: AC
  Filled 2015-03-10: qty 1

## 2015-03-10 MED ORDER — ASPIRIN EC 81 MG PO TBEC
81.0000 mg | DELAYED_RELEASE_TABLET | Freq: Every day | ORAL | Status: DC
  Filled 2015-03-10: qty 1

## 2015-03-10 MED ORDER — LIDOCAINE HCL (PF) 1 % IJ SOLN
INTRAMUSCULAR | Status: AC
  Filled 2015-03-10: qty 30

## 2015-03-10 MED ORDER — HEPARIN SODIUM (PORCINE) 1000 UNIT/ML IJ SOLN
INTRAMUSCULAR | Status: DC | PRN
  Administered 2015-03-10: 8000 [IU] via INTRAVENOUS

## 2015-03-10 MED ORDER — LACTATED RINGERS IV SOLN
INTRAVENOUS | Status: DC | PRN
  Administered 2015-03-10 (×2): via INTRAVENOUS

## 2015-03-10 MED ORDER — ROCURONIUM BROMIDE 100 MG/10ML IV SOLN
INTRAVENOUS | Status: DC | PRN
  Administered 2015-03-10: 40 mg via INTRAVENOUS

## 2015-03-10 MED ORDER — DEXTROSE 5 % IV SOLN
1.5000 g | Freq: Two times a day (BID) | INTRAVENOUS | Status: AC
  Administered 2015-03-10 – 2015-03-11 (×2): 1.5 g via INTRAVENOUS
  Filled 2015-03-10 (×2): qty 1.5

## 2015-03-10 MED ORDER — PROTAMINE SULFATE 10 MG/ML IV SOLN
INTRAVENOUS | Status: DC | PRN
Start: 2015-03-10 — End: 2015-03-10
  Administered 2015-03-10: 25 mg via INTRAVENOUS
  Administered 2015-03-10: 10 mg via INTRAVENOUS
  Administered 2015-03-10: 5 mg via INTRAVENOUS
  Administered 2015-03-10: 10 mg via INTRAVENOUS

## 2015-03-10 MED ORDER — ROCURONIUM BROMIDE 50 MG/5ML IV SOLN
INTRAVENOUS | Status: AC
  Filled 2015-03-10: qty 1

## 2015-03-10 MED ORDER — PROPOFOL 10 MG/ML IV BOLUS
INTRAVENOUS | Status: AC
  Filled 2015-03-10: qty 20

## 2015-03-10 MED ORDER — ARTIFICIAL TEARS OP OINT
TOPICAL_OINTMENT | OPHTHALMIC | Status: AC
Start: 2015-03-10 — End: 2015-03-10
  Filled 2015-03-10: qty 3.5

## 2015-03-10 MED ORDER — PHENYLEPHRINE HCL 10 MG/ML IJ SOLN
10.0000 mg | INTRAVENOUS | Status: DC | PRN
  Administered 2015-03-10: 25 ug/min via INTRAVENOUS

## 2015-03-10 MED ORDER — NEOSTIGMINE METHYLSULFATE 10 MG/10ML IV SOLN
INTRAVENOUS | Status: DC | PRN
  Administered 2015-03-10: 4 mg via INTRAVENOUS

## 2015-03-10 MED ORDER — PROMETHAZINE HCL 25 MG/ML IJ SOLN: 6.2500 mg | INTRAMUSCULAR | Status: DC | PRN

## 2015-03-10 MED ORDER — CYCLOBENZAPRINE HCL 5 MG PO TABS
2.5000 mg | ORAL_TABLET | Freq: Every day | ORAL | Status: DC | PRN
  Filled 2015-03-10: qty 0.5

## 2015-03-10 MED ORDER — LABETALOL HCL 5 MG/ML IV SOLN: 10.0000 mg | INTRAVENOUS | Status: DC | PRN

## 2015-03-10 MED ORDER — BISACODYL 10 MG RE SUPP: 10.0000 mg | Freq: Every day | RECTAL | Status: DC | PRN

## 2015-03-10 MED ORDER — HYDRALAZINE HCL 20 MG/ML IJ SOLN: 5.0000 mg | INTRAMUSCULAR | Status: DC | PRN

## 2015-03-10 MED ORDER — POTASSIUM CHLORIDE CRYS ER 20 MEQ PO TBCR: 20.0000 meq | EXTENDED_RELEASE_TABLET | Freq: Every day | ORAL | Status: DC | PRN

## 2015-03-10 MED ORDER — PROPOFOL 10 MG/ML IV BOLUS
INTRAVENOUS | Status: DC | PRN
  Administered 2015-03-10: 150 mg via INTRAVENOUS

## 2015-03-10 MED ORDER — ONDANSETRON HCL 4 MG/2ML IJ SOLN
INTRAMUSCULAR | Status: AC
  Filled 2015-03-10: qty 2

## 2015-03-10 MED ORDER — GUAIFENESIN-DM 100-10 MG/5ML PO SYRP: 15.0000 mL | ORAL_SOLUTION | ORAL | Status: DC | PRN

## 2015-03-10 MED ORDER — HEPARIN SODIUM (PORCINE) 1000 UNIT/ML IJ SOLN
INTRAMUSCULAR | Status: AC
  Filled 2015-03-10: qty 1

## 2015-03-10 MED ORDER — ENOXAPARIN SODIUM 30 MG/0.3ML ~~LOC~~ SOLN
30.0000 mg | SUBCUTANEOUS | Status: DC
  Filled 2015-03-10: qty 0.3

## 2015-03-10 MED ORDER — STERILE WATER FOR INJECTION IJ SOLN
INTRAMUSCULAR | Status: AC
  Filled 2015-03-10: qty 10

## 2015-03-10 MED ORDER — PHENOL 1.4 % MT LIQD: 1.0000 | OROMUCOSAL | Status: DC | PRN

## 2015-03-10 MED ORDER — GLYCOPYRROLATE 0.2 MG/ML IJ SOLN
INTRAMUSCULAR | Status: DC | PRN
  Administered 2015-03-10: 0.2 mg via INTRAVENOUS
  Administered 2015-03-10: 0.6 mg via INTRAVENOUS

## 2015-03-10 MED ORDER — ONDANSETRON HCL 4 MG/2ML IJ SOLN
INTRAMUSCULAR | Status: DC | PRN
  Administered 2015-03-10: 4 mg via INTRAVENOUS

## 2015-03-10 MED ORDER — LIDOCAINE HCL 4 % MT SOLN
OROMUCOSAL | Status: DC | PRN
Start: 2015-03-10 — End: 2015-03-10
  Administered 2015-03-10: 4 mL via TOPICAL

## 2015-03-10 MED ORDER — OXYCODONE HCL 5 MG PO TABS: 5.0000 mg | ORAL_TABLET | Freq: Once | ORAL | Status: DC | PRN

## 2015-03-10 MED ORDER — SODIUM CHLORIDE 0.9 % IV SOLN: INTRAVENOUS | Status: DC

## 2015-03-10 MED ORDER — ATORVASTATIN CALCIUM 20 MG PO TABS
20.0000 mg | ORAL_TABLET | Freq: Every day | ORAL | Status: DC
  Administered 2015-03-10: 20 mg via ORAL
  Filled 2015-03-10 (×2): qty 1

## 2015-03-10 MED ORDER — SODIUM CHLORIDE 0.9 % IR SOLN
Status: DC | PRN
  Administered 2015-03-10: 500 mL

## 2015-03-10 MED ORDER — IRBESARTAN 300 MG PO TABS
300.0000 mg | ORAL_TABLET | Freq: Every day | ORAL | Status: DC
  Administered 2015-03-10 – 2015-03-11 (×2): 300 mg via ORAL
  Filled 2015-03-10 (×2): qty 1

## 2015-03-10 MED ORDER — FENTANYL CITRATE (PF) 250 MCG/5ML IJ SOLN
INTRAMUSCULAR | Status: AC
  Filled 2015-03-10: qty 5

## 2015-03-10 MED ORDER — HYDROMORPHONE HCL 1 MG/ML IJ SOLN: 0.2500 mg | INTRAMUSCULAR | Status: DC | PRN

## 2015-03-10 MED ORDER — PROTAMINE SULFATE 10 MG/ML IV SOLN
INTRAVENOUS | Status: AC
  Filled 2015-03-10: qty 5

## 2015-03-10 MED ORDER — EPHEDRINE SULFATE 50 MG/ML IJ SOLN
INTRAMUSCULAR | Status: AC
  Filled 2015-03-10: qty 1

## 2015-03-10 MED ORDER — LIDOCAINE HCL (CARDIAC) 20 MG/ML IV SOLN
INTRAVENOUS | Status: DC | PRN
  Administered 2015-03-10: 40 mg via INTRAVENOUS

## 2015-03-10 MED ORDER — INSULIN ASPART 100 UNIT/ML ~~LOC~~ SOLN
0.0000 [IU] | Freq: Three times a day (TID) | SUBCUTANEOUS | Status: DC
  Administered 2015-03-11: 3 [IU] via SUBCUTANEOUS

## 2015-03-10 MED ORDER — METOPROLOL TARTRATE 1 MG/ML IV SOLN: 2.0000 mg | INTRAVENOUS | Status: DC | PRN

## 2015-03-10 MED ORDER — ALUM & MAG HYDROXIDE-SIMETH 200-200-20 MG/5ML PO SUSP: 15.0000 mL | ORAL | Status: DC | PRN

## 2015-03-10 MED ORDER — DOCUSATE SODIUM 100 MG PO CAPS
100.0000 mg | ORAL_CAPSULE | Freq: Every day | ORAL | Status: DC
  Administered 2015-03-11: 100 mg via ORAL
  Filled 2015-03-10: qty 1

## 2015-03-10 MED ORDER — EPHEDRINE SULFATE 50 MG/ML IJ SOLN
INTRAMUSCULAR | Status: DC | PRN
  Administered 2015-03-10: 5 mg via INTRAVENOUS
  Administered 2015-03-10 (×3): 10 mg via INTRAVENOUS

## 2015-03-10 MED ORDER — GLYCOPYRROLATE 0.2 MG/ML IJ SOLN
INTRAMUSCULAR | Status: AC
  Filled 2015-03-10: qty 3

## 2015-03-10 MED ORDER — OXYCODONE HCL 5 MG PO TABS
5.0000 mg | ORAL_TABLET | ORAL | Status: DC | PRN
  Administered 2015-03-10 – 2015-03-11 (×2): 10 mg via ORAL
  Filled 2015-03-10 (×2): qty 2

## 2015-03-10 MED ORDER — METFORMIN HCL 500 MG PO TABS
500.0000 mg | ORAL_TABLET | Freq: Four times a day (QID) | ORAL | Status: DC
  Administered 2015-03-11: 500 mg via ORAL
  Filled 2015-03-10 (×5): qty 1

## 2015-03-10 MED ORDER — ONDANSETRON HCL 4 MG/2ML IJ SOLN: 4.0000 mg | Freq: Four times a day (QID) | INTRAMUSCULAR | Status: DC | PRN

## 2015-03-10 MED ORDER — ACETAMINOPHEN 500 MG PO TABS: 500.0000 mg | ORAL_TABLET | Freq: Every day | ORAL | Status: DC | PRN

## 2015-03-10 MED ORDER — MORPHINE SULFATE 2 MG/ML IJ SOLN: 2.0000 mg | INTRAMUSCULAR | Status: DC | PRN

## 2015-03-10 MED ORDER — GLYCOPYRROLATE 0.2 MG/ML IJ SOLN
INTRAMUSCULAR | Status: AC
  Filled 2015-03-10: qty 1

## 2015-03-10 MED ORDER — 0.9 % SODIUM CHLORIDE (POUR BTL) OPTIME
TOPICAL | Status: DC | PRN
  Administered 2015-03-10 (×2): 1000 mL

## 2015-03-10 MED ORDER — OXYCODONE HCL 5 MG/5ML PO SOLN: 5.0000 mg | Freq: Once | ORAL | Status: DC | PRN

## 2015-03-10 MED ORDER — SENNOSIDES-DOCUSATE SODIUM 8.6-50 MG PO TABS
1.0000 | ORAL_TABLET | Freq: Every evening | ORAL | Status: DC | PRN
  Filled 2015-03-10: qty 1

## 2015-03-10 MED ORDER — LINAGLIPTIN 5 MG PO TABS
5.0000 mg | ORAL_TABLET | Freq: Every day | ORAL | Status: DC
  Administered 2015-03-10 – 2015-03-11 (×2): 5 mg via ORAL
  Filled 2015-03-10 (×2): qty 1

## 2015-03-10 MED ORDER — FENTANYL CITRATE (PF) 100 MCG/2ML IJ SOLN
INTRAMUSCULAR | Status: DC | PRN
  Administered 2015-03-10: 50 ug via INTRAVENOUS
  Administered 2015-03-10: 100 ug via INTRAVENOUS

## 2015-03-10 MED ORDER — SODIUM CHLORIDE 0.9 % IV SOLN
500.0000 mL | Freq: Once | INTRAVENOUS | Status: AC | PRN
  Administered 2015-03-10: 500 mL via INTRAVENOUS

## 2015-03-10 MED ORDER — PANTOPRAZOLE SODIUM 40 MG PO TBEC
40.0000 mg | DELAYED_RELEASE_TABLET | Freq: Every day | ORAL | Status: DC
  Administered 2015-03-11: 40 mg via ORAL
  Filled 2015-03-10: qty 1

## 2015-03-10 SURGICAL SUPPLY — 47 items
APL SKNCLS STERI-STRIP NONHPOA (GAUZE/BANDAGES/DRESSINGS) ×1
BENZOIN TINCTURE PRP APPL 2/3 (GAUZE/BANDAGES/DRESSINGS) ×2 IMPLANT
CANISTER SUCTION 2500CC (MISCELLANEOUS) ×2 IMPLANT
CANNULA VESSEL 3MM 2 BLNT TIP (CANNULA) IMPLANT
CATH ROBINSON RED A/P 18FR (CATHETERS) ×2 IMPLANT
CLIP LIGATING EXTRA MED SLVR (CLIP) ×2 IMPLANT
CLIP LIGATING EXTRA SM BLUE (MISCELLANEOUS) ×2 IMPLANT
CRADLE DONUT ADULT HEAD (MISCELLANEOUS) ×2 IMPLANT
DECANTER SPIKE VIAL GLASS SM (MISCELLANEOUS) IMPLANT
DRAIN HEMOVAC 1/8 X 5 (WOUND CARE) IMPLANT
DRSG COVADERM 4X6 (GAUZE/BANDAGES/DRESSINGS) ×1 IMPLANT
DRSG COVADERM 4X8 (GAUZE/BANDAGES/DRESSINGS) IMPLANT
ELECT REM PT RETURN 9FT ADLT (ELECTROSURGICAL) ×2
ELECTRODE REM PT RTRN 9FT ADLT (ELECTROSURGICAL) ×1 IMPLANT
EVACUATOR SILICONE 100CC (DRAIN) IMPLANT
GAUZE SPONGE 4X4 12PLY STRL (GAUZE/BANDAGES/DRESSINGS) ×2 IMPLANT
GEL ULTRASOUND 20GR AQUASONIC (MISCELLANEOUS) IMPLANT
GLOVE BIO SURGEON STRL SZ7.5 (GLOVE) ×1 IMPLANT
GLOVE BIOGEL PI IND STRL 6.5 (GLOVE) IMPLANT
GLOVE BIOGEL PI IND STRL 8 (GLOVE) IMPLANT
GLOVE BIOGEL PI INDICATOR 6.5 (GLOVE) ×2
GLOVE BIOGEL PI INDICATOR 8 (GLOVE) ×1
GLOVE ECLIPSE 6.5 STRL STRAW (GLOVE) ×1 IMPLANT
GLOVE SS BIOGEL STRL SZ 7.5 (GLOVE) ×1 IMPLANT
GLOVE SUPERSENSE BIOGEL SZ 7.5 (GLOVE) ×1
GOWN STRL REUS W/ TWL LRG LVL3 (GOWN DISPOSABLE) ×3 IMPLANT
GOWN STRL REUS W/TWL LRG LVL3 (GOWN DISPOSABLE) ×6
KIT BASIN OR (CUSTOM PROCEDURE TRAY) ×2 IMPLANT
KIT ROOM TURNOVER OR (KITS) ×2 IMPLANT
NEEDLE 22X1 1/2 (OR ONLY) (NEEDLE) IMPLANT
NS IRRIG 1000ML POUR BTL (IV SOLUTION) ×4 IMPLANT
PACK CAROTID (CUSTOM PROCEDURE TRAY) ×2 IMPLANT
PAD ARMBOARD 7.5X6 YLW CONV (MISCELLANEOUS) ×4 IMPLANT
PATCH HEMASHIELD 8X75 (Vascular Products) ×1 IMPLANT
SHUNT CAROTID BYPASS 10 (VASCULAR PRODUCTS) ×1 IMPLANT
SHUNT CAROTID BYPASS 12FRX15.5 (VASCULAR PRODUCTS) IMPLANT
SPONGE INTESTINAL PEANUT (DISPOSABLE) ×2 IMPLANT
STRIP CLOSURE SKIN 1/2X4 (GAUZE/BANDAGES/DRESSINGS) ×2 IMPLANT
SUT ETHILON 3 0 PS 1 (SUTURE) IMPLANT
SUT PROLENE 6 0 CC (SUTURE) ×2 IMPLANT
SUT SILK 3 0 (SUTURE)
SUT SILK 3-0 18XBRD TIE 12 (SUTURE) IMPLANT
SUT VIC AB 3-0 SH 27 (SUTURE) ×4
SUT VIC AB 3-0 SH 27X BRD (SUTURE) ×2 IMPLANT
SUT VICRYL 4-0 PS2 18IN ABS (SUTURE) ×2 IMPLANT
SYR CONTROL 10ML LL (SYRINGE) IMPLANT
WATER STERILE IRR 1000ML POUR (IV SOLUTION) ×2 IMPLANT

## 2015-03-10 NOTE — Progress Notes (Signed)
     Hypotensive post-op received 2 500 cc bolus now stable.   Smile symmetric  No tongue deviation  S/P left CEA Stable    Valari Taylor MAUREEN PA-C

## 2015-03-10 NOTE — Anesthesia Procedure Notes (Signed)
Procedure Name: Intubation Date/Time: 03/10/2015 8:55 AM Performed by: Trixie Deis A Pre-anesthesia Checklist: Patient identified, Timeout performed, Emergency Drugs available, Suction available and Patient being monitored Patient Re-evaluated:Patient Re-evaluated prior to inductionOxygen Delivery Method: Circle system utilized Preoxygenation: Pre-oxygenation with 100% oxygen Intubation Type: IV induction Ventilation: Mask ventilation without difficulty and Oral airway inserted - appropriate to patient size Laryngoscope Size: Mac and 3 Grade View: Grade I Tube type: Oral Tube size: 7.5 mm Number of attempts: 1 Airway Equipment and Method: Stylet and LTA kit utilized Placement Confirmation: ETT inserted through vocal cords under direct vision,  breath sounds checked- equal and bilateral and positive ETCO2 Secured at: 23 cm Tube secured with: Tape Dental Injury: Teeth and Oropharynx as per pre-operative assessment

## 2015-03-10 NOTE — Transfer of Care (Signed)
Immediate Anesthesia Transfer of Care Note  Patient: Daniel Mendez  Procedure(s) Performed: Procedure(s): Left Carotid ENDARTERECTOMY  (Left)  Patient Location: PACU  Anesthesia Type:General  Level of Consciousness: awake, alert  and oriented  Airway & Oxygen Therapy: Patient Spontanous Breathing and Patient connected to nasal cannula oxygen  Post-op Assessment: Report given to RN, Post -op Vital signs reviewed and stable, Patient moving all extremities X 4 and Patient able to stick tongue midline  Post vital signs: Reviewed and stable  Last Vitals:  Filed Vitals:   03/10/15 1117  BP:   Pulse: 62  Temp:   Resp: 15    Complications: No apparent anesthesia complications

## 2015-03-10 NOTE — Anesthesia Postprocedure Evaluation (Signed)
  Anesthesia Post-op Note  Patient: Daniel Mendez  Procedure(s) Performed: Procedure(s): Left Carotid ENDARTERECTOMY  (Left)  Patient Location: PACU  Anesthesia Type:General  Level of Consciousness: awake, alert , oriented and patient cooperative  Airway and Oxygen Therapy: Patient Spontanous Breathing and Patient connected to nasal cannula oxygen  Post-op Pain: none  Post-op Assessment: Post-op Vital signs reviewed, Patient's Cardiovascular Status Stable, Respiratory Function Stable, Patent Airway, No signs of Nausea or vomiting and Pain level controlled  Post-op Vital Signs: Reviewed and stable  Last Vitals:  Filed Vitals:   03/10/15 1642  BP: 142/66  Pulse: 67  Temp: 36.6 C  Resp: 21    Complications: No apparent anesthesia complications

## 2015-03-10 NOTE — Op Note (Signed)
     Patient name: Daniel Mendez MRN: [id number] DOB: [birth year] Sex: male  03/10/2015 Pre-operative Diagnosis: Asymptomatic left carotid stenosis Post-operative diagnosis:  Same Surgeon:  Rosetta Posner, M.D. Assistants:  Dr Scot Dock Procedure:    left carotid Endarterectomy with Dacron patch angioplasty Anesthesia:  General Blood Loss:  See anesthesia record   Indications for surgery:  Severs stenosis  Procedure in detail:  The patient was taken to the operating and placed in the supine position. The neck was prepped and draped in the usual sterile fashion. An incision was made anterior to the sternocleidomastoid muscle and continued with electrocautery through the platysma muscle. The muscle was retracted posteriorly and the carotid sheath was opened. The facial vein was ligated with 2-0 silk ties and divided. The common carotid artery was encircled with an umbilical tape and Rummel tourniquet. Dissection was continued onto the carotid bifurcation. The superior thyroid artery was controlled with a 2-0 silk Potts tie. The external carotid organ was encircled with a vessel loop and the internal carotid was encircled with umbilical tape and Rummel tourniquet. The hypoglossal and vagus nerves were identified and preserved.  The patient was given systemic heparinization. After adequate circulation time, the internal,external and common carotid arteries were occluded. The common carotid was opened with an 11 blade and the arteriotomy was continued with Potts scissors onto the internal carotid artery. A 10 shunt was passed up the internal carotid artery, allowed to back bleed, and then passed down the common carotid artery. The shunt was secured with Rummel tourniquet. The endarterectomy was begun on the common carotid artery  plaque was divided proximally with Potts scissors. The endarterectomy was continued onto the carotid bifurcation. The external carotid was endarterectomized by eversion technique  and the internal carotid artery was endarterectomized in an open fashion. Remaining debris was removed from the endarterectomy plane. A Dacron patch was brought to the field and sewn as a patch angioplasty. Prior to completing the anastomosis, the shunt was removed and the usual flushing maneuvers were undertaken. The anastomosis was then completed and flow was restored first to the external and then the internal carotid artery. Excellent flow characteristics were noted with hand-held Doppler in the internal and external carotid arteries.  The patient was given protamine to reverse the heparin. Hemostasis was obtained with electrocautery. The wounds were irrigated with saline. The wound was closed by first reapproximating the sternocleidomastoid muscle over the carotid artery with interrupted 3-0 Vicryl sutures. Next, the platysma was closed with a running 3-0 Vicryl suture. The skin was closed with a 4-0 subcuticular Vicryl suture. Benzoin and Steri-Strips were applied to the incision. A sterile dressing was placed over the incision. All sponge and needle counts were correct. The patient was awakened in the operating room, neurologically intact. They were transferred to the PACU in stable condition.  Carotid stenosis at surgery:>80%  Disposition:  To PACU in stable condition,neurologically intact  Relevant Operative Details:  Normal anatomy  Rosetta Posner, M.D. [facility] [place] Office: [phone] Pager:  [phone]

## 2015-03-10 NOTE — Anesthesia Preprocedure Evaluation (Addendum)
Anesthesia Evaluation  Patient identified by MRN, date of birth, ID band Patient awake    Reviewed: Allergy & Precautions, NPO status , Patient's Chart, lab work & pertinent test results  Airway Mallampati: II  TM Distance: >3 FB Neck ROM: Full    Dental  (+) Dental Advisory Given   Pulmonary shortness of breath, sleep apnea ,  breath sounds clear to auscultation        Cardiovascular Exercise Tolerance: Good METS: 7 - 9 Mets hypertension, Pt. on medications + Valvular Problems/Murmurs AS Rhythm:Regular Rate:Normal + Systolic murmurs 48/8891 TTE: Mild/ Mod AS. EF 55-60%.   Neuro/Psych negative neurological ROS     GI/Hepatic Neg liver ROS, hiatal hernia, GERD-  ,  Endo/Other  diabetes, Type 2, Oral Hypoglycemic Agents  Renal/GU negative Renal ROS     Musculoskeletal  (+) Arthritis -,   Abdominal   Peds  Hematology negative hematology ROS (+)   Anesthesia Other Findings   Reproductive/Obstetrics                            Anesthesia Physical Anesthesia Plan  ASA: III  Anesthesia Plan: General   Post-op Pain Management:    Induction: Intravenous  Airway Management Planned: Oral ETT  Additional Equipment: Arterial line  Intra-op Plan:   Post-operative Plan: Extubation in OR  Informed Consent: I have reviewed the patients History and Physical, chart, labs and discussed the procedure including the risks, benefits and alternatives for the proposed anesthesia with the patient or authorized representative who has indicated his/her understanding and acceptance.   Dental advisory given  Plan Discussed with: CRNA  Anesthesia Plan Comments:         Anesthesia Quick Evaluation

## 2015-03-10 NOTE — H&P (View-Only) (Signed)
Patient name: Daniel Mendez MRN: [id number] DOB: December 30, 1940 Sex: male   Referred by: Dwyane Dee  Reason for referral:  Chief Complaint  Patient presents with  . New Evaluation    carotid stenosis left    HISTORY OF PRESENT ILLNESS: The patient resents today for discussion of severe asymptomatic left carotid stenosis. He was found to have carotid bruit on his ankle exam and underwent subsequent duplex showing a high-grade left internal carotid artery stenosis and no significant stenosis on the right he is seen today for further discussion of this. The patient is left-handed. He specifically denies any prior episodes of aphasia, amaurosis fugax, transient ischemic attack or stroke. Does have a history of aortic stenosis. No coronary disease. Is being treated for hypercalcemia as well.  Past Medical History  Diagnosis Date  . Hyperglyceridemia   . Hypercalcemia   . Hyperlipidemia   . HTN (hypertension)   . Diabetes   . GERD (gastroesophageal reflux disease)   . Kidney stone   . Sleep apnea     no cpap  . Mild aortic stenosis Oct 2014    Past Surgical History  Procedure Laterality Date  . Hernia repair b/l inguinal    . Knee arthroscopy left    . Dental implants      dental post implanted to hold dentures/partials in place  . Colonoscopy with propofol N/A 11/19/2014    Procedure: COLONOSCOPY WITH PROPOFOL;  Surgeon: Garlan Fair, MD;  Location: [facility];  Service: Endoscopy;  Laterality: N/A;    History   Social History  . Marital Status: Married    Spouse Name: N/A  . Number of Children: N/A  . Years of Education: N/A   Occupational History  . Not on file.   Social History Main Topics  . Smoking status: Never Smoker   . Smokeless tobacco: Not on file  . Alcohol Use: Yes     Comment: rarely  . Drug Use: No  . Sexual Activity: Not on file   Other Topics Concern  . Not on file   Social History Narrative    Family History  Problem Relation Age of  Onset  . CVA Mother   . Peripheral vascular disease Mother   . Heart disease Father   . Heart disease Brother   . Diabetes Brother   . Cancer Daughter     Allergies as of 03/04/2015  . (No Known Allergies)    Current Outpatient Prescriptions on File Prior to Visit  Medication Sig Dispense Refill  . aspirin 81 MG chewable tablet Chew 81 mg by mouth daily.    [name] Kitchen atorvastatin (LIPITOR) 20 MG tablet Take 1 tablet by mouth daily.    . chlorhexidine (PERIDEX) 0.12 % solution Use as directed 10 mLs in the mouth or throat 2 (two) times daily as needed (Oral Pain).     . cyclobenzaprine (FLEXERIL) 5 MG tablet Take 5 mg by mouth 3 (three) times daily as needed for muscle spasms.    [name] Kitchen JANUVIA 100 MG tablet Take 100 mg by mouth daily.    . metFORMIN (GLUCOPHAGE) 500 MG tablet Take 1 tablet by mouth 4 (four) times daily.     [name] Kitchen omeprazole (PRILOSEC) 20 MG capsule Take 1 capsule by mouth every other day.     . sildenafil (VIAGRA) 100 MG tablet Take 100 mg by mouth daily as needed for erectile dysfunction.    [name] Kitchen telmisartan (MICARDIS) 80 MG tablet Take 1 tablet by mouth at  bedtime.     [name] Kitchen amLODipine (NORVASC) 10 MG tablet Take 1 tablet by mouth at bedtime.     Hinda Kehr 30 MG/ACT SOLN Apply 1 application topically daily.      No current facility-administered medications on file prior to visit.     REVIEW OF SYSTEMS:  Positives indicated with an "X"  CARDIOVASCULAR:  [ ]  chest pain   [ ]  chest pressure   [ ]  palpitations   [ ]  orthopnea   [x ] dyspnea on exertion   [ ]  claudication   [ ]  rest pain   [ ]  DVT   [ ]  phlebitis PULMONARY:   [ ]  productive cough   [ ]  asthma   [ ]  wheezing NEUROLOGIC:   [ ]  weakness  [ ]  paresthesias  [ ]  aphasia  [ ]  amaurosis  [ ]  dizziness HEMATOLOGIC:   [ ]  bleeding problems   [ ]  clotting disorders MUSCULOSKELETAL:  [ ]  joint pain   [ ]  joint swelling GASTROINTESTINAL: [ ]   blood in stool  [ ]   hematemesis GENITOURINARY:  [ ]   dysuria  [ ]    hematuria PSYCHIATRIC:  [ ]  history of major depression INTEGUMENTARY:  [ ]  rashes  [ ]  ulcers CONSTITUTIONAL:  [ ]  fever   [ ]  chills  PHYSICAL EXAMINATION:  General: The patient is a well-nourished male, in no acute distress. Vital signs are BP 138/70 mmHg  Pulse 52  Resp 18  Ht 5\' 10"  (1.778 m)  Wt 185 lb (83.915 kg)  BMI 26.54 kg/m2 Pulmonary: There is a good air exchange bilaterally without wheezing or rales. Abdomen: Soft and non-tender with normal pitch bowel sounds. Musculoskeletal: There are no major deformities.  There is no significant extremity pain. Neurologic: No focal weakness or paresthesias are detected, Skin: There are no ulcer or rashes noted. Psychiatric: The patient has normal affect. Cardiovascular: There is a regular rate and rhythm with soft systolic murmur Carotid: Soft bruit on left and none on the right Pulse status 2+ radial, 2+ dorsalis pedis pulses bilaterally   [facility] Vascular Lab Studies:  Ordered and Independently Reviewed left carotid duplex in our office revealed the high-grade stenosis in the left internal carotid artery. Compared to this to the study from [facility] from [facility] also showing high-grade left internal carotid artery stenosis.  Impression and Plan:  Severe asymptomatic left internal carotid artery stenosis. This at length with patient and his wife present. Explained the risk of stroke or transient ischemic attack and had approximate 5% per year with a high-grade asymptomatic stenosis. With his excellent health I have recommended endarterectomy for reduction of stroke risk. I explained the procedure including less than 2% chance of stroke with surgery and also the very slight risk of cranial nerve injury. Explain the overnight hospitalization and the usual expected recovery. I understand wished to proceed as soon as possible. Surgeries been scheduled for 03/10/2015    Amaranta Mehl [facility]  [place] Office: [phone]

## 2015-03-10 NOTE — Progress Notes (Signed)
On hold for room

## 2015-03-10 NOTE — Interval H&P Note (Signed)
History and Physical Interval Note:  03/10/2015 8:07 AM  Daniel Mendez  has presented today for surgery, with the diagnosis of Left carotid artery stenosis I65.22  The various methods of treatment have been discussed with the patient and family. After consideration of risks, benefits and other options for treatment, the patient has consented to  Procedure(s): ENDARTERECTOMY CAROTID (Left) as a surgical intervention .  The patient's history has been reviewed, patient examined, no change in status, stable for surgery.  I have reviewed the patient's chart and labs.  Questions were answered to the patient's satisfaction.     Makayah Pauli

## 2015-03-11 ENCOUNTER — Telehealth: Payer: Self-pay | Admitting: Vascular Surgery

## 2015-03-11 LAB — CBC
HEMATOCRIT: 36 % — AB (ref 39.0–52.0)
HEMOGLOBIN: 11.7 g/dL — AB (ref 13.0–17.0)
MCH: 27.7 pg (ref 26.0–34.0)
MCHC: 32.5 g/dL (ref 30.0–36.0)
MCV: 85.1 fL (ref 78.0–100.0)
Platelets: 152 10*3/uL (ref 150–400)
RBC: 4.23 MIL/uL (ref 4.22–5.81)
RDW: 13.8 % (ref 11.5–15.5)
WBC: 7.7 10*3/uL (ref 4.0–10.5)

## 2015-03-11 LAB — BASIC METABOLIC PANEL
ANION GAP: 7 (ref 5–15)
BUN: 8 mg/dL (ref 6–23)
CALCIUM: 9.5 mg/dL (ref 8.4–10.5)
CO2: 26 mmol/L (ref 19–32)
Chloride: 107 mmol/L (ref 96–112)
Creatinine, Ser: 1.17 mg/dL (ref 0.50–1.35)
GFR calc non Af Amer: 59 mL/min — ABNORMAL LOW (ref >90)
GFR, EST AFRICAN AMERICAN: 69 mL/min — AB (ref >90)
Glucose, Bld: 138 mg/dL — ABNORMAL HIGH (ref 70–99)
Potassium: 4 mmol/L (ref 3.5–5.1)
SODIUM: 140 mmol/L (ref 135–145)

## 2015-03-11 LAB — GLUCOSE, CAPILLARY: Glucose-Capillary: 151 mg/dL — ABNORMAL HIGH (ref 70–99)

## 2015-03-11 MED ORDER — OXYCODONE HCL 5 MG PO TABS: 5.0000 mg | ORAL_TABLET | Freq: Four times a day (QID) | ORAL | Status: DC | PRN

## 2015-03-11 NOTE — Progress Notes (Addendum)
  [facility] Progress Note  03/11/2015 7:35 AM 1 Day Post-Op  Subjective:  Doing well. No complaints.   Tmax 98.6 BP sys 80s-160s 02 96% 1L  Filed Vitals:   03/11/15 0400  BP: 109/51  Pulse: 51  Temp:   Resp: 20    Physical Exam: Incisions:  Left neck incision clean dry and intact. No hematoma. Neuro: no smile asymmetry, tongue midline. 5/5 strength upper and lower extremities bilaterally.   CBC    Component Value Date/Time   WBC 7.7 03/11/2015 0405   RBC 4.23 03/11/2015 0405   HGB 11.7* 03/11/2015 0405   HCT 36.0* 03/11/2015 0405   PLT 152 03/11/2015 0405   MCV 85.1 03/11/2015 0405   MCH 27.7 03/11/2015 0405   MCHC 32.5 03/11/2015 0405   RDW 13.8 03/11/2015 0405    BMET    Component Value Date/Time   NA 140 03/11/2015 0405   K 4.0 03/11/2015 0405   CL 107 03/11/2015 0405   CO2 26 03/11/2015 0405   GLUCOSE 138* 03/11/2015 0405   BUN 8 03/11/2015 0405   CREATININE 1.17 03/11/2015 0405   CALCIUM 9.5 03/11/2015 0405   GFRNONAA 59* 03/11/2015 0405   GFRAA 69* 03/11/2015 0405    INR    Component Value Date/Time   INR 0.98 03/06/2015 1139     Intake/Output Summary (Last 24 hours) at 03/11/15 0735 Last data filed at 03/11/15 0600  Gross per 24 hour  Intake   3300 ml  Output   1825 ml  Net   1475 ml     Assessment:  74 y.o. male is s/p: left carotid endarterectomy  1 Day Post-Op  Plan: -Neuro exam intact. Incision clean and intact without hematoma. -BP stable. -Has ambulated. -Has voided. -Discharge home today. Follow up with Dr. Donnetta Hutching in 2 weeks.    Virgina Jock, PA-C [facility] Office: [phone] Pager: [phone] 03/11/2015 7:35 AM     I have examined the patient, reviewed and agree with above.  Necie Wilcoxson, MD 03/11/2015 7:55 AM

## 2015-03-11 NOTE — Telephone Encounter (Signed)
-----   Message from Mena Goes, RN sent at 03/10/2015 12:01 PM EDT ----- Regarding: Schedule   ----- Message -----    From: Gabriel Earing, PA-C    Sent: 03/10/2015  10:48 AM      To: [facility] Charge Pool  S/p left CEA 03/10/15.  F/u in 2 weeks with Dr. Donnetta Hutching.  Thanks!

## 2015-03-11 NOTE — Progress Notes (Signed)
Pt d/c home per MD order, pt tol well, pt VSS, pt family at [facility], d/c instructions given, all questions answered

## 2015-03-12 ENCOUNTER — Encounter: Payer: Self-pay | Admitting: Endocrinology

## 2015-03-12 ENCOUNTER — Encounter (HOSPITAL_COMMUNITY): Payer: Self-pay | Admitting: Vascular Surgery

## 2015-03-12 NOTE — Discharge Summary (Signed)
[facility] Discharge Summary  Daniel Mendez [birth year] 74 y.o. male  [id number]  Admission Date: 03/10/2015  Discharge Date: 03/11/2015  Physician: Tinnie Gens, MD  Admission Diagnosis: Left carotid artery stenosis I65.22  HPI:   This is a 74 y.o. male who presented to the [facility] office for discussion of severe asymptomatic left carotid stenosis. He was found to have carotid bruit on his annual exam and underwent subsequent duplex showing a high-grade left internal carotid artery stenosis and no significant stenosis on the right he is seen today for further discussion of this. The patient is left-handed. He specifically denies any prior episodes of aphasia, amaurosis fugax, transient ischemic attack or stroke. Does have a history of aortic stenosis. No coronary disease. Is being treated for hypercalcemia as well.  Hospital Course:  The patient was admitted to the hospital and taken to the operating room on 03/10/2015 and underwent left carotid endarterectomy.  The patient tolerated the procedure well and was transported to the PACU in stable condition.  By POD 1, the patient's neuro status was intact. His neck incision was healing well without evidence of a hematoma. He was voiding and ambulating without difficulty and tolerating a diet. He was discharged home on POD 1 in good condition.     Recent Labs  03/11/15 0405  NA 140  K 4.0  CL 107  CO2 26  GLUCOSE 138*  BUN 8  CALCIUM 9.5    Recent Labs  03/10/15 1900 03/11/15 0405  WBC 10.1 7.7  HGB 12.1* 11.7*  HCT 36.3* 36.0*  PLT 156 152   No results for input(s): INR in the last 72 hours.  Discharge Instructions:   The patient is discharged to home with extensive instructions on wound care and progressive ambulation.  They are instructed not to drive or perform any heavy lifting until returning to see the physician in his office.  Discharge Instructions    CAROTID Sugery: Call MD for difficulty  swallowing or speaking; weakness in arms or legs that is a new symtom; severe headache.  If you have increased swelling in the neck and/or  are having difficulty breathing, CALL [phone]    Complete by:  As directed      Call MD for:  redness, tenderness, or signs of infection (pain, swelling, bleeding, redness, odor or green/yellow discharge around incision site)    Complete by:  As directed      Call MD for:  severe or increased pain, loss or decreased feeling  in affected limb(s)    Complete by:  As directed      Call MD for:  temperature >100.5    Complete by:  As directed      Discharge wound care:    Complete by:  As directed   Shower daily with soap and water starting 03/12/15     Driving Restrictions    Complete by:  As directed   No driving for 2 weeks     Lifting restrictions    Complete by:  As directed   No lifting for 2 weeks     Resume previous diet    Complete by:  As directed            Discharge Diagnosis:  Left carotid artery stenosis I65.22  Secondary Diagnosis: Patient Active Problem List   Diagnosis Date Noted  . Left carotid artery stenosis 03/10/2015  . Hypercalcemia 02/17/2015  . Left carotid bruit 02/11/2015  . Angina pectoris 08/26/2014  .  Aortic stenosis, moderate 10/15/2013  . Dyspnea on effort 10/15/2013  . Diabetes mellitus type 2 with complications 36/14/4315  . Essential hypertension, uncontrolled 10/15/2013  . Dyslipidemia 10/15/2013    Class: Chronic   Past Medical History  Diagnosis Date  . Hyperglyceridemia   . Hypercalcemia   . Hyperlipidemia   . HTN (hypertension)   . GERD (gastroesophageal reflux disease)   . Kidney stone   . Mild aortic stenosis Oct 2014  . Sleep apnea     no cpap  . Diabetes     type 2  . Heart murmur     discovered in 2015 - followed by Dr. Daneen Schick  . History of hiatal hernia   . Arthritis     hands  . Shortness of breath dyspnea     on exertion  . Anginal pain   . Coronary artery disease   .  Peripheral vascular disease       Medication List    TAKE these medications        acetaminophen 500 MG tablet  Commonly known as:  TYLENOL  Take 500 mg by mouth daily as needed (pain).     aspirin EC 81 MG tablet  Take 81 mg by mouth at bedtime.     atorvastatin 20 MG tablet  Commonly known as:  LIPITOR  Take 20 mg by mouth at bedtime.     chlorhexidine 0.12 % solution  Commonly known as:  PERIDEX  Use as directed 15 mLs in the mouth or throat 2 (two) times daily as needed (mouth sores). Swish and spit     cyclobenzaprine 5 MG tablet  Commonly known as:  FLEXERIL  Take 2.5 mg by mouth daily as needed for muscle spasms.     Fish Oil 1000 MG Caps  Take 2,000 mg by mouth 2 (two) times daily.     metFORMIN 500 MG tablet  Commonly known as:  GLUCOPHAGE  Take 500 mg by mouth 4 (four) times daily.     omeprazole 20 MG capsule  Commonly known as:  PRILOSEC  Take 20 mg by mouth every other day.     oxyCODONE 5 MG immediate release tablet  Commonly known as:  Oxy IR/ROXICODONE  Take 1 tablet (5 mg total) by mouth every 6 (six) hours as needed for moderate pain.     sildenafil 20 MG tablet  Commonly known as:  REVATIO  Take 60-100 mg by mouth daily as needed (erectile  dysfunction).     sitaGLIPtin 100 MG tablet  Commonly known as:  JANUVIA  Take 100 mg by mouth daily with breakfast.     telmisartan 80 MG tablet  Commonly known as:  MICARDIS  Take 80 mg by mouth at bedtime.     Testosterone 30 MG/ACT Soln  Place 30 mg onto the skin 3 (three) times daily. AXIRON        Roxicodone #20 No Refill  Disposition: Home  Patient's condition: is Good  Follow up: 1. Dr.  Donnetta Hutching in 2 weeks.   Virgina Jock, PA-C [facility] [phone]  --- For [facility] use --- Instructions: Press F2 to tab through selections.  Delete question if not applicable.   Modified Rankin score at D/C (0-6): 0  IV medication needed for:  1. Hypertension: No 2.  Hypotension: No  Post-op Complications: No  1. Post-op CVA or TIA: No  2. CN injury: No  3. Myocardial infarction: No  4.  CHF: No  5.  Dysrhythmia (new): No  6. Wound infection: No  7. Reperfusion symptoms: No  8. Return to OR: No  Discharge medications: Statin use:  Yes If No: [ ]  For Medical reasons, [ ]  Non-compliant, [ ]  Not-indicated ASA use:  Yes  If No: [ ]  For Medical reasons, [ ]  Non-compliant, [ ]  Not-indicated Beta blocker use:  No If No: [ ]  For Medical reasons, [ ]  Non-compliant, [x ] Not-indicated ACE-Inhibitor use:  Yes If No: [ ]  For Medical reasons, [ ]  Non-compliant, [ ]  Not-indicated P2Y12 Antagonist use: No, [ ]  Plavix, [ ]  Plasugrel, [ ]  Ticlopinine, [ ]  Ticagrelor, [ ]  Other, [ ]  No for medical reason, [ ]  Non-compliant, [x ] Not-indicated Anti-coagulant use:  No, [ ]  Warfarin, [ ]  Rivaroxaban, [ ]  Dabigatran, [ ]  Other, [ ]  No for medical reason, [ ]  Non-compliant, [x ] Not-indicated

## 2015-03-28 ENCOUNTER — Encounter: Payer: Self-pay | Admitting: Vascular Surgery

## 2015-04-01 ENCOUNTER — Encounter: Payer: Medicare Other | Admitting: Vascular Surgery

## 2015-04-01 ENCOUNTER — Encounter: Payer: Self-pay | Admitting: Vascular Surgery

## 2015-04-01 ENCOUNTER — Ambulatory Visit (INDEPENDENT_AMBULATORY_CARE_PROVIDER_SITE_OTHER): Payer: Self-pay | Admitting: Vascular Surgery

## 2015-04-01 VITALS — BP 141/71 | HR 61 | Resp 16 | Ht 70.0 in | Wt 182.0 lb

## 2015-04-01 DIAGNOSIS — I6522 Occlusion and stenosis of left carotid artery: Secondary | ICD-10-CM

## 2015-04-01 DIAGNOSIS — Z48812 Encounter for surgical aftercare following surgery on the circulatory system: Secondary | ICD-10-CM

## 2015-04-01 NOTE — Progress Notes (Signed)
Filed Vitals:   04/01/15 1542 04/01/15 1548 04/01/15 1549  BP: 152/75 147/76 141/71  Pulse: 67 64 61  Resp: 16    Height: 5\' 10"  (1.778 m)    Weight: 182 lb (82.555 kg)     Body mass index is 26.11 kg/(m^2).

## 2015-04-01 NOTE — Progress Notes (Signed)
Here today for follow-up of recent left carotid endarterectomy for severe asymptomatic carotid stenosis. I did well with surgery and was discharged postop day 1. He has had no difficulty following surgery. His had minimal discomfort with his incision. His had no neurologic deficits  On physical exam his neck incision is well-healed without hematoma. He has the usual healing ridge. He does have typical. Incisional numbness. He has no bruits bilaterally  Impression and plan: Stable status post left carotid endarterectomy for severe asymptomatic disease. We'll continue his full activities without limitation. We will see him again in 6 months with repeat carotid duplex follow-up he will notify should he develop any wound issues or neurologic deficits.

## 2015-04-03 NOTE — Addendum Note (Signed)
Addended by: Dorthula Rue L on: 04/03/2015 02:24 PM   Modules accepted: Orders

## 2015-05-19 ENCOUNTER — Other Ambulatory Visit: Payer: Self-pay

## 2015-06-10 ENCOUNTER — Other Ambulatory Visit: Payer: Self-pay

## 2015-06-10 ENCOUNTER — Ambulatory Visit (HOSPITAL_COMMUNITY): Payer: Medicare Other | Attending: Interventional Cardiology

## 2015-06-10 DIAGNOSIS — E119 Type 2 diabetes mellitus without complications: Secondary | ICD-10-CM | POA: Diagnosis not present

## 2015-06-10 DIAGNOSIS — I340 Nonrheumatic mitral (valve) insufficiency: Secondary | ICD-10-CM | POA: Insufficient documentation

## 2015-06-10 DIAGNOSIS — I10 Essential (primary) hypertension: Secondary | ICD-10-CM | POA: Insufficient documentation

## 2015-06-10 DIAGNOSIS — I350 Nonrheumatic aortic (valve) stenosis: Secondary | ICD-10-CM | POA: Diagnosis present

## 2015-06-10 DIAGNOSIS — E785 Hyperlipidemia, unspecified: Secondary | ICD-10-CM | POA: Diagnosis not present

## 2015-06-17 ENCOUNTER — Telehealth: Payer: Self-pay

## 2015-06-17 NOTE — Telephone Encounter (Signed)
Pt aware of echo results. AS is now moderate in severity compared to study from 2 years ago. No action required unless symptoms. Plan will be to repeat in 1 year. Pt would like appt cancelled with Estella Husk, PA since he was given ech results over thephone. Pt advised on symptoms to look out for for AS. Pt will f/u with Dr.Smith as planned in Oct 2016

## 2015-06-17 NOTE — Telephone Encounter (Signed)
-----   Message from Belva Crome, MD sent at 06/15/2015  3:23 PM EDT ----- AS is now moderate in severity compared to study from 2 years ago. No action required unless symptoms. Plan will be to repeat in 1 year.

## 2015-07-14 ENCOUNTER — Ambulatory Visit: Payer: Medicare Other | Admitting: Physician Assistant

## 2015-07-29 ENCOUNTER — Other Ambulatory Visit (HOSPITAL_COMMUNITY): Payer: Medicare Other

## 2015-08-27 ENCOUNTER — Encounter: Payer: Self-pay | Admitting: Interventional Cardiology

## 2015-08-27 ENCOUNTER — Ambulatory Visit (INDEPENDENT_AMBULATORY_CARE_PROVIDER_SITE_OTHER): Payer: Medicare Other | Admitting: Interventional Cardiology

## 2015-08-27 VITALS — BP 162/88 | HR 72 | Ht 70.0 in | Wt 185.1 lb

## 2015-08-27 DIAGNOSIS — I350 Nonrheumatic aortic (valve) stenosis: Secondary | ICD-10-CM

## 2015-08-27 DIAGNOSIS — R9439 Abnormal result of other cardiovascular function study: Secondary | ICD-10-CM

## 2015-08-27 DIAGNOSIS — I209 Angina pectoris, unspecified: Secondary | ICD-10-CM

## 2015-08-27 DIAGNOSIS — I10 Essential (primary) hypertension: Secondary | ICD-10-CM

## 2015-08-27 DIAGNOSIS — E118 Type 2 diabetes mellitus with unspecified complications: Secondary | ICD-10-CM

## 2015-08-27 MED ORDER — SPIRONOLACTONE 25 MG PO TABS: 12.5000 mg | ORAL_TABLET | Freq: Every day | ORAL | Status: DC

## 2015-08-27 NOTE — Progress Notes (Signed)
Cardiology Office Note   Date:  08/27/2015   ID:  SHERON ROBIN, DOB [birth year], MRN [id number]  PCP:  Kandice Hams, MD  Cardiologist:  Sinclair Grooms, MD   Chief Complaint  Patient presents with  . Cardiac Valve Problem      History of Present Illness: Daniel Mendez is a 74 y.o. male who presents for recent left carotid endarterectomy, moderate aortic stenosis, suspected CAD based on abnormal Cardiolite (asymptomatic), essential hypertension, and hyperlipidemia. Also has hypercalcemia.  Complains of exertional fatigue and dyspnea, worse initiate the last year. He worries that the valve is causing this problem. Recent aortic valve assessment demonstrates only moderate AS. Peak velocity across the aortic valve is 3.48 m/s. The echo report is not mentioned this.  Recent blood pressures have been elevated. Possibly related to hypercalcemia which is being evaluated and treated.    Past Medical History  Diagnosis Date  . Hyperglyceridemia   . Hypercalcemia   . Hyperlipidemia   . HTN (hypertension)   . GERD (gastroesophageal reflux disease)   . Kidney stone   . Mild aortic stenosis Oct 2014  . Sleep apnea     no cpap  . Diabetes ([place])     type 2  . Heart murmur     discovered in 2015 - followed by Dr. Daneen Schick  . History of hiatal hernia   . Arthritis     hands  . Shortness of breath dyspnea     on exertion  . Anginal pain ([place])   . Coronary artery disease   . Peripheral vascular disease [facility])     Past Surgical History  Procedure Laterality Date  . Hernia repair b/l inguinal    . Knee arthroscopy left    . Dental implants      dental post implanted to hold dentures/partials in place  . Colonoscopy with propofol N/A 11/19/2014    Procedure: COLONOSCOPY WITH PROPOFOL;  Surgeon: Garlan Fair, MD;  Location: [facility];  Service: Endoscopy;  Laterality: N/A;  . Vasectomy    . Vascular surgery    . Fracture surgery  1958    Left hand   .  Hernia repair      Bilateral inguinal   . Endarterectomy Left 03/10/2015    Procedure: Left Carotid ENDARTERECTOMY ;  Surgeon: Rosetta Posner, MD;  Location: [facility] OR;  Service: Vascular;  Laterality: Left;     Current Outpatient Prescriptions  Medication Sig Dispense Refill  . acetaminophen (TYLENOL) 500 MG tablet Take 500 mg by mouth daily as needed (pain).    [name] Kitchen amLODipine (NORVASC) 10 MG tablet Take 10 mg by mouth daily.    [name] Kitchen aspirin EC 81 MG tablet Take 81 mg by mouth at bedtime.    [name] Kitchen atorvastatin (LIPITOR) 20 MG tablet Take 20 mg by mouth at bedtime.     . chlorhexidine (PERIDEX) 0.12 % solution Use as directed 15 mLs in the mouth or throat 2 (two) times daily as needed (mouth sores). Swish and spit    . cyclobenzaprine (FLEXERIL) 5 MG tablet Take 2.5 mg by mouth daily as needed for muscle spasms.     . metFORMIN (GLUCOPHAGE) 500 MG tablet Take 500 mg by mouth 4 (four) times daily.     . Omega-3 Fatty Acids (FISH OIL) 1000 MG CAPS Take 2,000 mg by mouth 2 (two) times daily.     [name] Kitchen omeprazole (PRILOSEC) 20 MG capsule Take 20 mg by mouth every other day.     [name] Kitchen  sildenafil (REVATIO) 20 MG tablet Take 60-100 mg by mouth daily as needed (erectile  dysfunction).     . sitaGLIPtin (JANUVIA) 100 MG tablet Take 100 mg by mouth daily with breakfast.    . telmisartan (MICARDIS) 80 MG tablet Take 80 mg by mouth at bedtime.     . Testosterone 30 MG/ACT SOLN Place 30 mg onto the skin 4 (four) times daily. AXIRON    . VITAMIN D, ERGOCALCIFEROL, PO Take 1 capsule by mouth daily.     [name] Kitchen spironolactone (ALDACTONE) 25 MG tablet Take 0.5 tablets (12.5 mg total) by mouth daily. 15 tablet 11   No current facility-administered medications for this visit.    Allergies:   Review of patient's allergies indicates no known allergies.    Social History:  The patient  reports that he has never smoked. He has never used smokeless tobacco. He reports that he drinks alcohol. He reports that he does not use illicit drugs.     Family History:  The patient's family history includes CVA in his mother; Cancer in his daughter; Diabetes in his brother; Heart disease in his brother and father; Peripheral vascular disease in his mother.    ROS:  Please see the history of present illness.   Otherwise, review of systems are positive for healing left carotid endarterectomy scar. Decreased hearing. Dyspnea on exertion. No syncope or angina..   All other systems are reviewed and negative.    PHYSICAL EXAM: VS:  BP 162/88 mmHg  Pulse 72  Ht 5\' 10"  (1.778 m)  Wt 83.97 kg (185 lb 1.9 oz)  BMI 26.56 kg/m2  SpO2 94% , BMI Body mass index is 26.56 kg/(m^2). GEN: Well nourished, well developed, in no acute distress HEENT: normal Neck: no JVD, carotid bruits, or masses. Evidence of prior left carotid endarterectomy. Bilateral transmitted systolic bruits from the aortic valve. Cardiac: RRR.  There 3/6 crescendo decrescendo murmur of the aortic stenosis. No  rub, or gallop. There is no edema. Respiratory:  clear to auscultation bilaterally, normal work of breathing. GI: soft, nontender, nondistended, + BS MS: no deformity or atrophy Skin: warm and dry, no rash Neuro:  Strength and sensation are intact Psych: euthymic mood, full affect   EKG:  EKG is not ordered today.    Recent Labs: 03/06/2015: ALT 53 03/11/2015: BUN 8; Creatinine, Ser 1.17; Hemoglobin 11.7*; Platelets 152; Potassium 4.0; Sodium 140    Lipid Panel No results found for: CHOL, TRIG, HDL, CHOLHDL, VLDL, LDLCALC, LDLDIRECT    Wt Readings from Last 3 Encounters:  08/27/15 83.97 kg (185 lb 1.9 oz)  04/01/15 82.555 kg (182 lb)  03/10/15 83.915 kg (185 lb)      Other studies Reviewed: Additional studies/ records that were reviewed today include:  It appears that amlodipine is been discontinued but after calling the patient's wife and reviewing his active medications , this is not true.. The findings include  Hypercalcemia is being actively investigated  and treated.    ASSESSMENT AND PLAN:  1. Angina pectoris ([place]) Denies angina  2. Essential hypertension, uncontrolled Very poor blood pressure control. Probably being aggravated by hypercalcemia. - spironolactone (ALDACTONE) 25 MG tablet; Take 0.5 tablets (12.5 mg total) by mouth daily.  Dispense: 15 tablet; Refill: 11 - Basic metabolic panel; Future - Calcium; Future  3. Aortic stenosis, moderate Moderate by most recent echo - Echocardiogram; Future  4. Abnormal stress electrocardiogram test Coronary disease is asymptomatic  5. Type 2 diabetes mellitus with complication, without long-term current use  of insulin ([place]) Managed by Dr. Buddy Duty and Dr. polite. Also has hypercalcemia  6. Hypercalcemia  under investigation and treatment by Dr. Buddy Duty  Current medicines are reviewed at length with the patient today.  The patient has the following concerns regarding medicines:  He is concerned about adding additional antihypertensive therapy. I believe the elevated blood pressures contributing to his exertional symptoms..  The following changes/actions have been instituted:    Aldactone 12.5 mg daily  Calcium and basic metabolic panel 9-38 days  2-D Doppler echo in one year  Labs/ tests ordered today include:   Orders Placed This Encounter  Procedures  . Basic metabolic panel  . Calcium  . Echocardiogram     Disposition:   FU with HS in 1 year  Signed, Sinclair Grooms, MD  08/27/2015 2:49 PM    [facility] [address], [place], [place]  [address] Phone: [phone]; Fax: [phone]

## 2015-08-27 NOTE — Patient Instructions (Addendum)
Medication Instructions:  Your physician has recommended you make the following change in your medication:  START Spironolactone 12.5mg  (1/2 tablet) daily and Rx has been sen to your pharmacy  Labwork: Your physician recommends that you return for lab work in: 7-10 days (Bmet, Calcium)   Testing/Procedures: Your physician has requested that you have an echocardiogram. Echocardiography is a painless test that uses sound waves to create images of your heart. It provides your doctor with information about the size and shape of your heart and how well your heart's chambers and valves are working. This procedure takes approximately one hour. There are no restrictions for this procedure. (To be scheduled in September 2017)  Follow-Up: Your physician wants you to follow-up in: 1 year with Dr.Smith You will receive a reminder letter in the mail two months in advance. If you don't receive a letter, please call our office to schedule the follow-up appointment.   Any Other Special Instructions Will Be Listed Below (If Applicable).

## 2015-09-02 ENCOUNTER — Other Ambulatory Visit (HOSPITAL_COMMUNITY): Payer: Self-pay | Admitting: Internal Medicine

## 2015-09-02 ENCOUNTER — Ambulatory Visit (HOSPITAL_COMMUNITY)
Admission: RE | Admit: 2015-09-02 | Discharge: 2015-09-02 | Disposition: A | Discharge status: Home / Self Care | Payer: Medicare Other | Source: Ambulatory Visit | Attending: Internal Medicine | Admitting: Internal Medicine

## 2015-09-02 ENCOUNTER — Encounter (HOSPITAL_COMMUNITY): Payer: Self-pay

## 2015-09-02 DIAGNOSIS — M8580 Other specified disorders of bone density and structure, unspecified site: Secondary | ICD-10-CM | POA: Insufficient documentation

## 2015-09-02 DIAGNOSIS — K219 Gastro-esophageal reflux disease without esophagitis: Secondary | ICD-10-CM | POA: Diagnosis not present

## 2015-09-02 MED ORDER — SODIUM CHLORIDE 0.9 % IV SOLN
Freq: Once | INTRAVENOUS | Status: AC
  Administered 2015-09-02: 250 mL via INTRAVENOUS

## 2015-09-02 MED ORDER — ZOLEDRONIC ACID 5 MG/100ML IV SOLN
5.0000 mg | Freq: Once | INTRAVENOUS | Status: AC
  Administered 2015-09-02: 5 mg via INTRAVENOUS
  Filled 2015-09-02: qty 100

## 2015-09-02 NOTE — Discharge Instructions (Signed)
Drink fluids/water as tolerated over next 72hrs °Tylenol or Ibuprofen OTC as directed °Continue calcium and Vit D as directed by your MDZoledronic Acid injection (Paget's Disease, Osteoporosis) °What is this medicine? °ZOLEDRONIC ACID (ZOE le dron ik AS id) lowers the amount of calcium loss from bone. It is used to treat Paget's disease and osteoporosis in women. °This medicine may be used for other purposes; ask your health care provider or pharmacist if you have questions. °What should I tell my health care provider before I take this medicine? °They need to know if you have any of these conditions: °-aspirin-sensitive asthma °-cancer, especially if you are receiving medicines used to treat cancer °-dental disease or wear dentures °-infection °-kidney disease °-low levels of calcium in the blood °-past surgery on the parathyroid gland or intestines °-receiving corticosteroids like dexamethasone or prednisone °-an unusual or allergic reaction to zoledronic acid, other medicines, foods, dyes, or preservatives °-pregnant or trying to get pregnant °-breast-feeding °How should I use this medicine? °This medicine is for infusion into a vein. It is given by a health care professional in a hospital or clinic setting. °Talk to your pediatrician regarding the use of this medicine in children. This medicine is not approved for use in children. °Overdosage: If you think you have taken too much of this medicine contact a poison control center or emergency room at once. °NOTE: This medicine is only for you. Do not share this medicine with others. °What if I miss a dose? °It is important not to miss your dose. Call your doctor or health care professional if you are unable to keep an appointment. °What may interact with this medicine? °-certain antibiotics given by injection °-NSAIDs, medicines for pain and inflammation, like ibuprofen or naproxen °-some diuretics like bumetanide, furosemide °-teriparatide °This list may not  describe all possible interactions. Give your health care provider a list of all the medicines, herbs, non-prescription drugs, or dietary supplements you use. Also tell them if you smoke, drink alcohol, or use illegal drugs. Some items may interact with your medicine. °What should I watch for while using this medicine? °Visit your doctor or health care professional for regular checkups. It may be some time before you see the benefit from this medicine. Do not stop taking your medicine unless your doctor tells you to. Your doctor may order blood tests or other tests to see how you are doing. °Women should inform their doctor if they wish to become pregnant or think they might be pregnant. There is a potential for serious side effects to an unborn child. Talk to your health care professional or pharmacist for more information. °You should make sure that you get enough calcium and vitamin D while you are taking this medicine. Discuss the foods you eat and the vitamins you take with your health care professional. °Some people who take this medicine have severe bone, joint, and/or muscle pain. This medicine may also increase your risk for jaw problems or a broken thigh bone. Tell your doctor right away if you have severe pain in your jaw, bones, joints, or muscles. Tell your doctor if you have any pain that does not go away or that gets worse. °Tell your dentist and dental surgeon that you are taking this medicine. You should not have major dental surgery while on this medicine. See your dentist to have a dental exam and fix any dental problems before starting this medicine. Take good care of your teeth while on this medicine. Make sure you   see your dentist for regular follow-up appointments. °What side effects may I notice from receiving this medicine? °Side effects that you should report to your doctor or health care professional as soon as possible: °-allergic reactions like skin rash, itching or hives, swelling of  the face, lips, or tongue °-anxiety, confusion, or depression °-breathing problems °-changes in vision °-eye pain °-feeling faint or lightheaded, falls °-jaw pain, especially after dental work °-mouth sores °-muscle cramps, stiffness, or weakness °-redness, blistering, peeling or loosening of the skin, including inside the mouth °-trouble passing urine or change in the amount of urine °Side effects that usually do not require medical attention (report to your doctor or health care professional if they continue or are bothersome): °-bone, joint, or muscle pain °-constipation °-diarrhea °-fever °-hair loss °-irritation at site where injected °-loss of appetite °-nausea, vomiting °-stomach upset °-trouble sleeping °-trouble swallowing °-weak or tired °This list may not describe all possible side effects. Call your doctor for medical advice about side effects. You may report side effects to FDA at 1-800-FDA-1088. °Where should I keep my medicine? °This drug is given in a hospital or clinic and will not be stored at home. °NOTE: This sheet is a summary. It may not cover all possible information. If you have questions about this medicine, talk to your doctor, pharmacist, or health care provider. °  °© 2016, [employer]. (2014-04-06 14:19:57) ° °

## 2015-09-03 ENCOUNTER — Other Ambulatory Visit (INDEPENDENT_AMBULATORY_CARE_PROVIDER_SITE_OTHER): Payer: Medicare Other

## 2015-09-03 DIAGNOSIS — I10 Essential (primary) hypertension: Secondary | ICD-10-CM

## 2015-09-03 LAB — CALCIUM: Calcium: 10.3 mg/dL (ref 8.6–10.3)

## 2015-09-03 LAB — BASIC METABOLIC PANEL
BUN: 20 mg/dL (ref 7–25)
CALCIUM: 10.3 mg/dL (ref 8.6–10.3)
CHLORIDE: 102 mmol/L (ref 98–110)
CO2: 25 mmol/L (ref 20–31)
CREATININE: 1.13 mg/dL (ref 0.70–1.18)
Glucose, Bld: 150 mg/dL — ABNORMAL HIGH (ref 65–99)
Potassium: 4.4 mmol/L (ref 3.5–5.3)
SODIUM: 137 mmol/L (ref 135–146)

## 2015-09-26 ENCOUNTER — Encounter: Payer: Self-pay | Admitting: Vascular Surgery

## 2015-09-30 ENCOUNTER — Ambulatory Visit (INDEPENDENT_AMBULATORY_CARE_PROVIDER_SITE_OTHER): Payer: Medicare Other | Admitting: Vascular Surgery

## 2015-09-30 ENCOUNTER — Ambulatory Visit (HOSPITAL_COMMUNITY)
Admission: RE | Admit: 2015-09-30 | Discharge: 2015-09-30 | Disposition: A | Discharge status: Home / Self Care | Payer: Medicare Other | Source: Ambulatory Visit | Attending: Vascular Surgery | Admitting: Vascular Surgery

## 2015-09-30 ENCOUNTER — Encounter: Payer: Self-pay | Admitting: Vascular Surgery

## 2015-09-30 VITALS — BP 165/77 | HR 75 | Temp 98.1°F | Resp 16 | Ht 70.0 in | Wt 181.0 lb

## 2015-09-30 DIAGNOSIS — I6522 Occlusion and stenosis of left carotid artery: Secondary | ICD-10-CM | POA: Diagnosis present

## 2015-09-30 DIAGNOSIS — Z48812 Encounter for surgical aftercare following surgery on the circulatory system: Secondary | ICD-10-CM

## 2015-09-30 NOTE — Addendum Note (Signed)
Addended by: Dorthula Rue L on: 09/30/2015 04:45 PM   Modules accepted: Orders

## 2015-09-30 NOTE — Progress Notes (Signed)
[facility]  Patient name: Daniel Mendez MRN: [id number] DOB: Sep 21, 1941 Sex: male  REASON FOR VISIT: Follow-up of left carotid endarterectomy  HPI: Daniel Mendez is a 74 y.o. male in today for follow-up of left carotid endarterectomy for severe asymptomatic disease on 03/10/2015. He is doing quite well since his surgery. He reports no neurologic deficits. He has had no cardiac issues. He specifically denies any amaurosis fugax ischemic attack or stroke. He reports that. Incisional numbness is mostly resolved.  Past Medical History  Diagnosis Date  . Hyperglyceridemia   . Hypercalcemia   . Hyperlipidemia   . HTN (hypertension)   . GERD (gastroesophageal reflux disease)   . Kidney stone   . Mild aortic stenosis Oct 2014  . Sleep apnea     no cpap  . Diabetes ([place])     type 2  . Heart murmur     discovered in 2015 - followed by Dr. Daneen Schick  . History of hiatal hernia   . Arthritis     hands  . Shortness of breath dyspnea     on exertion  . Anginal pain ([place])   . Coronary artery disease   . Peripheral vascular disease ([place])     Family History  Problem Relation Age of Onset  . CVA Mother   . Peripheral vascular disease Mother   . Heart disease Father   . Heart disease Brother   . Diabetes Brother   . Cancer Daughter     SOCIAL HISTORY: Social History  Substance Use Topics  . Smoking status: Never Smoker   . Smokeless tobacco: Never Used  . Alcohol Use: 0.0 oz/week    0 Standard drinks or equivalent per week     Comment: rarely    No Known Allergies  Current Outpatient Prescriptions  Medication Sig Dispense Refill  . acetaminophen (TYLENOL) 500 MG tablet Take 500 mg by mouth daily as needed (pain).    [name] Kitchen amLODipine (NORVASC) 10 MG tablet Take 10 mg by mouth daily.    [name] Kitchen aspirin EC 81 MG tablet Take 81 mg by mouth at bedtime.    [name] Kitchen atorvastatin (LIPITOR) 20 MG tablet Take 20 mg by mouth at bedtime.     . metFORMIN  (GLUCOPHAGE) 500 MG tablet Take 500 mg by mouth 4 (four) times daily.     . Omega-3 Fatty Acids (FISH OIL) 1000 MG CAPS Take 2,000 mg by mouth 2 (two) times daily.     [name] Kitchen omeprazole (PRILOSEC) 20 MG capsule Take 20 mg by mouth every other day.     . sildenafil (REVATIO) 20 MG tablet Take 60-100 mg by mouth daily as needed (erectile  dysfunction).     . sitaGLIPtin (JANUVIA) 100 MG tablet Take 100 mg by mouth daily with breakfast.    . spironolactone (ALDACTONE) 25 MG tablet Take 0.5 tablets (12.5 mg total) by mouth daily. 15 tablet 11  . telmisartan (MICARDIS) 80 MG tablet Take 80 mg by mouth at bedtime.     . Testosterone 30 MG/ACT SOLN Place 30 mg onto the skin 4 (four) times daily. AXIRON    . VITAMIN D, ERGOCALCIFEROL, PO Take 1 capsule by mouth daily.     . chlorhexidine (PERIDEX) 0.12 % solution Use as directed 15 mLs in the mouth or throat 2 (two) times daily as needed (mouth sores). Swish and spit    . cyclobenzaprine (FLEXERIL) 5 MG tablet Take 2.5 mg by mouth daily as needed  for muscle spasms.      No current facility-administered medications for this visit.    REVIEW OF SYSTEMS:  [X]  denotes positive finding, [ ]  denotes negative finding Cardiac  Comments:  Chest pain or chest pressure:    Shortness of breath upon exertion:    Short of breath when lying flat:    Irregular heart rhythm:        Vascular    Pain in calf, thigh, or hip brought on by ambulation:    Pain in feet at night that wakes you up from your sleep:     Blood clot in your veins:    Leg swelling:         Pulmonary    Oxygen at home:    Productive cough:     Wheezing:         Neurologic    Sudden weakness in arms or legs:     Sudden numbness in arms or legs:     Sudden onset of difficulty speaking or slurred speech:    Temporary loss of vision in one eye:     Problems with dizziness:         Gastrointestinal    Blood in stool:     Vomited blood:         Genitourinary    Burning when urinating:      Blood in urine:        Psychiatric    Major depression:         Hematologic    Bleeding problems:    Problems with blood clotting too easily:        Skin    Rashes or ulcers:        Constitutional    Fever or chills:      PHYSICAL EXAM: Filed Vitals:   09/30/15 1558 09/30/15 1600 09/30/15 1601  BP: 166/82 165/82 165/77  Pulse: 109 75 75  Temp: 98.1 F (36.7 C)    Resp: 16    Height: 5\' 10"  (1.778 m)    Weight: 181 lb (82.101 kg)    SpO2: 95%      GENERAL: The patient is a well-nourished male, in no acute distress. The vital signs are documented above. CARDIAC: There is a regular rate and rhythm.  VASCULAR: Left and neck incision is well-healed and there is no carotid bruits bilaterally. 2+ radial and 2+ dorsalis pedis pulses bilaterally PULMONARY: There is good air exchange bilaterally without wheezing or rales. ABDOMEN: Soft and non-tender with no masses noted MUSCULOSKELETAL: There are no major deformities or cyanosis. NEUROLOGIC: No focal weakness or paresthesias are detected. SKIN: There are no ulcers or rashes noted. PSYCHIATRIC: The patient has a normal affect.  DATA:  Left carotid duplex today was reviewed. Patient has a widely patent endarterectomy with no evidence of stenosis  MEDICAL ISSUES: Stable status post left carotid endarterectomy for severe asymptomatic disease in April 2016. He will continue his usual activities. He is continuing take a baby aspirin daily. He will notify us if he has any mild neurologic deficits and presented to the emergency room for severe deficits. We will see him again in 6 months for bilateral carotid duplex for follow-up of his left endarterectomy and moderate right carotid stenosis   [facility] Shurley [facility] [employer]: [phone]

## 2015-09-30 NOTE — Progress Notes (Signed)
Filed Vitals:   09/30/15 1558 09/30/15 1600 09/30/15 1601  BP: 166/82 165/82 165/77  Pulse: 109 75 75  Temp: 98.1 F (36.7 C)    Resp: 16    Height: 5\' 10"  (1.778 m)    Weight: 181 lb (82.101 kg)    SpO2: 95%

## 2015-10-14 ENCOUNTER — Encounter (HOSPITAL_COMMUNITY): Payer: Medicare Other

## 2015-10-14 ENCOUNTER — Ambulatory Visit: Payer: Medicare Other | Admitting: Vascular Surgery

## 2015-11-05 ENCOUNTER — Encounter: Payer: Self-pay | Admitting: *Deleted

## 2015-11-05 ENCOUNTER — Encounter: Payer: Medicare Other | Attending: Internal Medicine | Admitting: *Deleted

## 2015-11-05 VITALS — Ht 70.0 in | Wt 185.1 lb

## 2015-11-05 DIAGNOSIS — E118 Type 2 diabetes mellitus with unspecified complications: Secondary | ICD-10-CM

## 2015-11-05 DIAGNOSIS — E119 Type 2 diabetes mellitus without complications: Secondary | ICD-10-CM | POA: Diagnosis present

## 2015-11-05 NOTE — Progress Notes (Signed)
Diabetes Self-Management Education  Visit Type: First/Initial  Appt. Start Time: 0830 Appt. End Time: 1000  11/05/2015  Mr. Daniel Mendez, identified by name and date of birth, is a 74 y.o. male with a diagnosis of Diabetes: Type 2. Mr. Daniel Mendez presents due to increase of A1c. He has generally been in the low 7's but now at  7.9%. He notes that he has a problem with will power. In review of his dietary intake  I note he is generally making good choices with opportunities for improvement. Mr. Daniel Mendez is retired but remains very active in [employer] and exercise.  ASSESSMENT  Height '5\' 10"'  (1.778 m), weight 185 lb 1.6 oz (83.961 kg). Body mass index is 26.56 kg/(m^2).      Diabetes Self-Management Education - 11/05/15 0853    Visit Information   Visit Type First/Initial   Initial Visit   Diabetes Type Type 2   Are you currently following a meal plan? No   Are you taking your medications as prescribed? Yes   Date Diagnosed 2007   Health Coping   How would you rate your overall health? Good   Psychosocial Assessment   Patient Belief/Attitude about Diabetes Motivated to manage diabetes   Self-care barriers None   Self-management support Doctor's office;Family;CDE visits   Other persons present Patient   Patient Concerns Nutrition/Meal planning;Medication;Glycemic Control   Special Needs None   Preferred Learning Style No preference indicated   Learning Readiness Change in progress   How often do you need to have someone help you when you read instructions, pamphlets, or other written materials from your doctor or pharmacy? 1 - Never   Complications   Last HgB A1C per patient/outside source 7.9 %   How often do you check your blood sugar? 1-2 times/day   Fasting Blood glucose range (mg/dL) 130-179   Number of hypoglycemic episodes per month 2   Can you tell when your blood sugar is low? Yes   What do you do if your blood sugar is low? shaky   Number of hyperglycemic  episodes per week 0   Have you had a dilated eye exam in the past 12 months? Yes   Have you had a dental exam in the past 12 months? Yes   Are you checking your feet? No   Dietary Intake   Breakfast Oatmeal 3/4C, when in season will add strawberries & peaches, nuts, butter, nutrasweet brown sugar/honey, milk Skim  (leave the honey off)    Coffee, nutrasweet, skim milk   Snack (morning) banana, apples   Lunch [place] stew with crackers, peanut butter on toast, (jelly?)   Snack (afternoon) banana,   Dinner chicken, peas, lima beans, baked sweet potatoes, baked / mashed potatoe   Snack (evening)  sugar free cookies, pimento cheese   Beverage(s) coffee, diet coke, water   Exercise   Exercise Type Light (walking / raking leaves)   How many days per week to you exercise? 4   How many minutes per day do you exercise? 45   Total minutes per week of exercise 180   Patient Education   Previous Diabetes Education Yes (please comment)  2007 at time of diagnosis met with dietitian   Disease state  Definition of diabetes, type 1 and 2, and the diagnosis of diabetes;Factors that contribute to the development of diabetes   Nutrition management  Role of diet in the treatment of diabetes and the relationship between the three main macronutrients and blood glucose level;Food  label reading, portion sizes and measuring food.;Carbohydrate counting;Reviewed blood glucose goals for pre and post meals and how to evaluate the patients' food intake on their blood glucose level.;Meal options for control of blood glucose level and chronic complications.   Physical activity and exercise  Role of exercise on diabetes management, blood pressure control and cardiac health.   Monitoring Purpose and frequency of SMBG.;Identified appropriate SMBG and/or A1C goals.;Yearly dilated eye exam   Acute complications Taught treatment of hypoglycemia - the 15 rule.   Chronic complications Relationship between chronic complications  and blood glucose control;Dental care;Identified and discussed with patient  current chronic complications   Individualized Goals (developed by patient)   Nutrition General guidelines for healthy choices and portions discussed   Physical Activity Exercise 5-7 days per week;30 minutes per day   Medications take my medication as prescribed   Monitoring  test blood glucose pre and post meals as discussed   Reducing Risk increase portions of nuts and seeds   Outcomes   Expected Outcomes Demonstrated interest in learning. Expect positive outcomes   Future DMSE PRN   Program Status Completed      Individualized Plan for Diabetes Self-Management Training:   Learning Objective:  Patient will have a greater understanding of diabetes self-management. Patient education plan is to attend individual and/or group sessions per assessed needs and concerns.   Plan:   Patient Instructions  Plan:  Aim for 2-3 Carb Choices per meal (30-45 grams) +/- 1 either way  Aim for 0-15 Carbs per snack if hungry  Include protein in moderation with your meals and snacks Consider reading food labels for Total Carbohydrate and Fat Grams of foods Continue your activity level daily as tolerated Consider checking BG at alternate times per day to consider fasting and 2 hours after a meal as directed by MD  Consider taking Metformin 2 tablets with breakfast and 2 tablets with Dinner Ok to take Januvia with breakfast Always take Amaryl with a meal  OK to have 2 pieces of toast with peanut butter at lunch. Consider sugar free jelly. Spread not glob  [place] Balanced Break for snacks [facility] Protein Bar Dannon Light & Fit GREEK Yogurt  Consider water flavorings to increase your water intake   Expected Outcomes:  Demonstrated interest in learning. Expect positive outcomes  Education material provided: Living Well with Diabetes, Food label handouts, Meal plan card and Snack sheet  If problems or questions,  patient to contact team via:  Phone  Future DSME appointment: PRN

## 2015-11-05 NOTE — Patient Instructions (Addendum)
Plan:  Aim for 2-3 Carb Choices per meal (30-45 grams) +/- 1 either way  Aim for 0-15 Carbs per snack if hungry  Include protein in moderation with your meals and snacks Consider reading food labels for Total Carbohydrate and Fat Grams of foods Continue your activity level daily as tolerated Consider checking BG at alternate times per day to consider fasting and 2 hours after a meal as directed by MD  Consider taking Metformin 2 tablets with breakfast and 2 tablets with Dinner Ok to take Januvia with breakfast Always take Amaryl with a meal  OK to have 2 pieces of toast with peanut butter at lunch. Consider sugar free jelly. Spread not glob  [place] Balanced Break for snacks [facility] Protein Bar Dannon Light & Fit GREEK Yogurt  Consider water flavorings to increase your water intake

## 2016-03-25 ENCOUNTER — Encounter: Payer: Self-pay | Admitting: Family

## 2016-03-30 ENCOUNTER — Encounter: Payer: Self-pay | Admitting: Family

## 2016-03-30 ENCOUNTER — Ambulatory Visit (INDEPENDENT_AMBULATORY_CARE_PROVIDER_SITE_OTHER): Payer: Medicare Other | Admitting: Family

## 2016-03-30 ENCOUNTER — Ambulatory Visit (HOSPITAL_COMMUNITY)
Admission: RE | Admit: 2016-03-30 | Discharge: 2016-03-30 | Disposition: A | Discharge status: Home / Self Care | Payer: Medicare Other | Source: Ambulatory Visit | Attending: Family | Admitting: Family

## 2016-03-30 VITALS — BP 156/76 | HR 57 | Temp 97.3°F | Ht 71.0 in | Wt 186.7 lb

## 2016-03-30 DIAGNOSIS — K219 Gastro-esophageal reflux disease without esophagitis: Secondary | ICD-10-CM | POA: Diagnosis not present

## 2016-03-30 DIAGNOSIS — E119 Type 2 diabetes mellitus without complications: Secondary | ICD-10-CM | POA: Diagnosis not present

## 2016-03-30 DIAGNOSIS — E7800 Pure hypercholesterolemia, unspecified: Secondary | ICD-10-CM | POA: Diagnosis not present

## 2016-03-30 DIAGNOSIS — I6522 Occlusion and stenosis of left carotid artery: Secondary | ICD-10-CM

## 2016-03-30 DIAGNOSIS — I10 Essential (primary) hypertension: Secondary | ICD-10-CM | POA: Insufficient documentation

## 2016-03-30 DIAGNOSIS — Z48812 Encounter for surgical aftercare following surgery on the circulatory system: Secondary | ICD-10-CM | POA: Diagnosis not present

## 2016-03-30 DIAGNOSIS — Z9889 Other specified postprocedural states: Secondary | ICD-10-CM | POA: Diagnosis not present

## 2016-03-30 DIAGNOSIS — I6523 Occlusion and stenosis of bilateral carotid arteries: Secondary | ICD-10-CM | POA: Diagnosis not present

## 2016-03-30 NOTE — Progress Notes (Signed)
Chief Complaint: Follow up Extracranial Carotid Artery Stenosis   History of Present Illness  Daniel Mendez is a 75 y.o. male patient of Dr. Donnetta Hutching who is s/p left carotid endarterectomy for severe asymptomatic disease on 03/10/2015. He returns today for follow up. The patient denies any history of TIA or stroke symptoms, specifically the patient denies a history of amaurosis fugax or monocular blindness, denies a history unilateral  of facial drooping, denies a history of hemiplegia, and denies a history of receptive or expressive aphasia.  Pt denies claudication sx's with walking. He stays physically active.    He has a known heart murmur, aortic stenosis, denies feeling symptomatic with this unless he walks very fast, he will then feel mildly dyspneic.  The patient denies New Medical or Surgical History.  Pt Diabetic: yes, states his last A1C was 6.? Pt smoker: non-smoker  Pt meds include: Statin : yes ASA: yes Other anticoagulants/antiplatelets: no   Past Medical History  Diagnosis Date  . Hyperglyceridemia   . Hypercalcemia   . Hyperlipidemia   . HTN (hypertension)   . GERD (gastroesophageal reflux disease)   . Kidney stone   . Mild aortic stenosis Oct 2014  . Sleep apnea     no cpap  . Diabetes ([place])     type 2  . Heart murmur     discovered in 2015 - followed by Dr. Daneen Schick  . History of hiatal hernia   . Arthritis     hands  . Shortness of breath dyspnea     on exertion  . Anginal pain ([place])   . Coronary artery disease   . Peripheral vascular disease [facility])     Social History Social History  Substance Use Topics  . Smoking status: Never Smoker   . Smokeless tobacco: Never Used  . Alcohol Use: 0.0 oz/week    0 Standard drinks or equivalent per week     Comment: rarely    Family History Family History  Problem Relation Age of Onset  . CVA Mother   . Peripheral vascular disease Mother   . Heart disease Father   . Heart disease Brother    . Diabetes Brother   . Cancer Daughter     Surgical History Past Surgical History  Procedure Laterality Date  . Hernia repair b/l inguinal    . Knee arthroscopy left    . Dental implants      dental post implanted to hold dentures/partials in place  . Colonoscopy with propofol N/A 11/19/2014    Procedure: COLONOSCOPY WITH PROPOFOL;  Surgeon: Garlan Fair, MD;  Location: [facility];  Service: Endoscopy;  Laterality: N/A;  . Vasectomy    . Vascular surgery    . Fracture surgery  1958    Left hand   . Hernia repair      Bilateral inguinal   . Endarterectomy Left 03/10/2015    Procedure: Left Carotid ENDARTERECTOMY ;  Surgeon: Rosetta Posner, MD;  Location: [place];  Service: Vascular;  Laterality: Left;    No Known Allergies  Current Outpatient Prescriptions  Medication Sig Dispense Refill  . amLODipine (NORVASC) 10 MG tablet Take 10 mg by mouth daily.    [name] Kitchen aspirin EC 81 MG tablet Take 81 mg by mouth at bedtime.    [name] Kitchen atorvastatin (LIPITOR) 20 MG tablet Take 20 mg by mouth at bedtime.     . chlorhexidine (PERIDEX) 0.12 % solution Use as directed 15 mLs in the mouth or  throat 2 (two) times daily as needed (mouth sores). Swish and spit    . cyclobenzaprine (FLEXERIL) 5 MG tablet Take 2.5 mg by mouth daily as needed for muscle spasms.     [name] Kitchen glimepiride (AMARYL) 4 MG tablet Take 4 mg by mouth daily with breakfast.    . metFORMIN (GLUCOPHAGE) 500 MG tablet Take 500 mg by mouth 4 (four) times daily.     . Omega-3 Fatty Acids (FISH OIL) 1000 MG CAPS Take 2,000 mg by mouth 2 (two) times daily.     [name] Kitchen omeprazole (PRILOSEC) 20 MG capsule Take 20 mg by mouth every other day.     . ONE TOUCH ULTRA TEST test strip     . ONETOUCH DELICA LANCETS 99991111 MISC     . sildenafil (REVATIO) 20 MG tablet Take 60-100 mg by mouth daily as needed (erectile  dysfunction).     . sitaGLIPtin (JANUVIA) 100 MG tablet Take 100 mg by mouth daily with breakfast.    . spironolactone (ALDACTONE) 25 MG tablet Take 0.5  tablets (12.5 mg total) by mouth daily. 15 tablet 11  . telmisartan (MICARDIS) 80 MG tablet Take 80 mg by mouth at bedtime.     . Testosterone 30 MG/ACT SOLN Place 30 mg onto the skin 4 (four) times daily. AXIRON    . VITAMIN D, ERGOCALCIFEROL, PO Take 1 capsule by mouth daily.     [name] Kitchen acetaminophen (TYLENOL) 500 MG tablet Take 500 mg by mouth daily as needed (pain). Reported on 03/30/2016     No current facility-administered medications for this visit.    Review of Systems : See HPI for pertinent positives and negatives.  Physical Examination  Filed Vitals:   03/30/16 1556  BP: 156/76  Pulse: 57  Temp: 97.3 F (36.3 C)  TempSrc: Oral  Height: 5\' 11"  (1.803 m)  Weight: 186 lb 11.2 oz (84.687 kg)  SpO2: 93%   Body mass index is 26.05 kg/(m^2).  General: WDWN male in NAD GAIT: normal Eyes: PERRLA Pulmonary:  Non-labored respirations, CTAB, no rales,  rhonchi, or wheezing.  Cardiac: regular rhythm, +murmur.  VASCULAR EXAM Carotid Bruits Right Left   Negative Positive    Aorta is not palpable. Radial pulses are 2+ palpable and equal.                                                                                                                            LE Pulses Right Left       POPLITEAL  not palpable   not palpable       POSTERIOR TIBIAL   palpable   palpable        DORSALIS PEDIS      ANTERIOR TIBIAL  palpable   palpable     Gastrointestinal: soft, nontender, BS WNL, no r/g, no palpable masses.  Musculoskeletal: no muscle atrophy/wasting. M/S 5/5 throughout, extremities without ischemic changes.  Neurologic: A&O X 3; Appropriate Affect, Speech is normal, CN 2-12  intact, pain and light touch intact in extremities, Motor exam as listed above.   Non-Invasive Vascular Imaging CAROTID DUPLEX 03/30/2016   Right ICA: <40% stenosis. Left ICA: CEA site with no restenosis.    Assessment: Daniel Mendez is a 75 y.o. male who is s/p left carotid endarterectomy  for severe asymptomatic disease on 03/10/2015. He has no history of stroke or TIA. Today's carotid duplex indicates <40% right ICA stenosis and no restenosis of the left ICA (CEA site).  His atherosclerotic risk factors include well controlled DM. Fortunately he has never used tobacco.    Plan: Follow-up in 1 year with Carotid Duplex scan.   I discussed in depth with the patient the nature of atherosclerosis, and emphasized the importance of maximal medical management including strict control of blood pressure, blood glucose, and lipid levels, obtaining regular exercise, and continued cessation of smoking.  The patient is aware that without maximal medical management the underlying atherosclerotic disease process will progress, limiting the benefit of any interventions. The patient was given information about stroke prevention and what symptoms should prompt the patient to seek immediate medical care. Thank you for allowing [place] to participate in this patient's care.  Clemon Chambers, RN, MSN, FNP-C [facility] Office: [phone]  Clinic Physician: Early  03/30/2016 4:11 PM

## 2016-03-30 NOTE — Patient Instructions (Signed)
Stroke Prevention Some medical conditions and behaviors are associated with an increased chance of having a stroke. You may prevent a stroke by making healthy choices and managing medical conditions. HOW CAN I REDUCE MY RISK OF HAVING A STROKE?   Stay physically active. Get at least 30 minutes of activity on most or all days.  Do not smoke. It may also be helpful to avoid exposure to secondhand smoke.  Limit alcohol use. Moderate alcohol use is considered to be:  No more than 2 drinks per day for men.  No more than 1 drink per day for nonpregnant women.  Eat healthy foods. This involves:  Eating 5 or more servings of fruits and vegetables a day.  Making dietary changes that address high blood pressure (hypertension), high cholesterol, diabetes, or obesity.  Manage your cholesterol levels.  Making food choices that are high in fiber and low in saturated fat, trans fat, and cholesterol may control cholesterol levels.  Take any prescribed medicines to control cholesterol as directed by your health care provider.  Manage your diabetes.  Controlling your carbohydrate and sugar intake is recommended to manage diabetes.  Take any prescribed medicines to control diabetes as directed by your health care provider.  Control your hypertension.  Making food choices that are low in salt (sodium), saturated fat, trans fat, and cholesterol is recommended to manage hypertension.  Ask your health care provider if you need treatment to lower your blood pressure. Take any prescribed medicines to control hypertension as directed by your health care provider.  If you are 18-39 years of age, have your blood pressure checked every 3-5 years. If you are 40 years of age or older, have your blood pressure checked every year.  Maintain a healthy weight.  Reducing calorie intake and making food choices that are low in sodium, saturated fat, trans fat, and cholesterol are recommended to manage  weight.  Stop drug abuse.  Avoid taking birth control pills.  Talk to your health care provider about the risks of taking birth control pills if you are over 35 years old, smoke, get migraines, or have ever had a blood clot.  Get evaluated for sleep disorders (sleep apnea).  Talk to your health care provider about getting a sleep evaluation if you snore a lot or have excessive sleepiness.  Take medicines only as directed by your health care provider.  For some people, aspirin or blood thinners (anticoagulants) are helpful in reducing the risk of forming abnormal blood clots that can lead to stroke. If you have the irregular heart rhythm of atrial fibrillation, you should be on a blood thinner unless there is a good reason you cannot take them.  Understand all your medicine instructions.  Make sure that other conditions (such as anemia or atherosclerosis) are addressed. SEEK IMMEDIATE MEDICAL CARE IF:   You have sudden weakness or numbness of the face, arm, or leg, especially on one side of the body.  Your face or eyelid droops to one side.  You have sudden confusion.  You have trouble speaking (aphasia) or understanding.  You have sudden trouble seeing in one or both eyes.  You have sudden trouble walking.  You have dizziness.  You have a loss of balance or coordination.  You have a sudden, severe headache with no known cause.  You have new chest pain or an irregular heartbeat. Any of these symptoms may represent a serious problem that is an emergency. Do not wait to see if the symptoms will   go away. Get medical help at once. Call your local emergency services (911 in [place] Do not drive yourself to the hospital.   This information is not intended to replace advice given to you by your health care provider. Make sure you discuss any questions you have with your health care provider.   Document Released: 12/16/2004 Document Revised: 11/29/2014 Document Reviewed:  05/11/2013 Elsevier Interactive Patient Education 2016 [employer]  

## 2016-05-13 NOTE — Addendum Note (Signed)
Addended by: Mena Goes on: 05/13/2016 03:27 PM   Modules accepted: Orders

## 2016-08-02 ENCOUNTER — Ambulatory Visit (HOSPITAL_COMMUNITY): Payer: Medicare Other | Attending: Internal Medicine

## 2016-08-02 ENCOUNTER — Other Ambulatory Visit: Payer: Self-pay

## 2016-08-02 DIAGNOSIS — I119 Hypertensive heart disease without heart failure: Secondary | ICD-10-CM | POA: Insufficient documentation

## 2016-08-02 DIAGNOSIS — I358 Other nonrheumatic aortic valve disorders: Secondary | ICD-10-CM | POA: Diagnosis not present

## 2016-08-02 DIAGNOSIS — G4730 Sleep apnea, unspecified: Secondary | ICD-10-CM | POA: Insufficient documentation

## 2016-08-02 DIAGNOSIS — I059 Rheumatic mitral valve disease, unspecified: Secondary | ICD-10-CM | POA: Insufficient documentation

## 2016-08-02 DIAGNOSIS — I739 Peripheral vascular disease, unspecified: Secondary | ICD-10-CM | POA: Insufficient documentation

## 2016-08-02 DIAGNOSIS — E785 Hyperlipidemia, unspecified: Secondary | ICD-10-CM | POA: Insufficient documentation

## 2016-08-02 DIAGNOSIS — I350 Nonrheumatic aortic (valve) stenosis: Secondary | ICD-10-CM | POA: Diagnosis present

## 2016-08-02 DIAGNOSIS — I2510 Atherosclerotic heart disease of native coronary artery without angina pectoris: Secondary | ICD-10-CM | POA: Diagnosis not present

## 2016-08-29 ENCOUNTER — Other Ambulatory Visit: Payer: Self-pay | Admitting: Interventional Cardiology

## 2016-08-29 DIAGNOSIS — I10 Essential (primary) hypertension: Secondary | ICD-10-CM

## 2016-10-08 ENCOUNTER — Encounter: Payer: Self-pay | Admitting: Interventional Cardiology

## 2016-10-20 ENCOUNTER — Ambulatory Visit (INDEPENDENT_AMBULATORY_CARE_PROVIDER_SITE_OTHER): Payer: Medicare Other | Admitting: Interventional Cardiology

## 2016-10-20 ENCOUNTER — Encounter: Payer: Self-pay | Admitting: Interventional Cardiology

## 2016-10-20 VITALS — BP 138/72 | HR 61 | Ht 70.0 in | Wt 185.8 lb

## 2016-10-20 DIAGNOSIS — I10 Essential (primary) hypertension: Secondary | ICD-10-CM

## 2016-10-20 DIAGNOSIS — I6522 Occlusion and stenosis of left carotid artery: Secondary | ICD-10-CM | POA: Diagnosis not present

## 2016-10-20 DIAGNOSIS — I350 Nonrheumatic aortic (valve) stenosis: Secondary | ICD-10-CM | POA: Diagnosis not present

## 2016-10-20 DIAGNOSIS — I209 Angina pectoris, unspecified: Secondary | ICD-10-CM | POA: Diagnosis not present

## 2016-10-20 DIAGNOSIS — E785 Hyperlipidemia, unspecified: Secondary | ICD-10-CM

## 2016-10-20 DIAGNOSIS — E118 Type 2 diabetes mellitus with unspecified complications: Secondary | ICD-10-CM

## 2016-10-20 NOTE — Patient Instructions (Signed)
Medication Instructions:  None  Labwork: None  Testing/Procedures: None  Follow-Up: Your physician wants you to follow-up in: 9-12 months with Dr. [name]. You will receive a reminder letter in the mail two months in advance. If you don't receive a letter, please call our office to schedule the follow-up appointment.   Any Other Special Instructions Will Be Listed Below (If Applicable).  Please call our office if you have any chest pain, shortness of breath or episodes of passing out.    If you need a refill on your cardiac medications before your next appointment, please call your pharmacy.

## 2016-10-20 NOTE — Progress Notes (Signed)
Cardiology Office Note    Date:  10/20/2016   ID:  KEEVON SLATER, DOB 04-01-1941, MRN [id number]  PCP:  Kandice Hams, MD  Cardiologist: Sinclair Grooms, MD   Chief Complaint  Patient presents with  . Cardiac Valve Problem    History of Present Illness:  Daniel Mendez is a 75 y.o. male  for recent left carotid endarterectomy, moderate aortic stenosis, suspected CAD based on abnormal Cardiolite (asymptomatic), essential hypertension, and hyperlipidemia. Also has hypercalcemia.  Very minimal chest tightness with heavy exertion such as climbing several flights of stairs. Otherwise no problems. This complaint is been stable over several years. He denies syncope, palpitations, edema, and orthopnea.  No palpitations or other cardiac complaints.   Past Medical History:  Diagnosis Date  . Anginal pain ([place])   . Arthritis    hands  . Coronary artery disease   . Diabetes ([place])    type 2  . GERD (gastroesophageal reflux disease)   . Heart murmur    discovered in 2015 - followed by Dr. Daneen Schick  . History of hiatal hernia   . HTN (hypertension)   . Hypercalcemia   . Hyperglyceridemia   . Hyperlipidemia   . Kidney stone   . Mild aortic stenosis Oct 2014  . Peripheral vascular disease ([place])   . Shortness of breath dyspnea    on exertion  . Sleep apnea    no cpap    Past Surgical History:  Procedure Laterality Date  . COLONOSCOPY WITH PROPOFOL N/A 11/19/2014   Procedure: COLONOSCOPY WITH PROPOFOL;  Surgeon: Garlan Fair, MD;  Location: [facility];  Service: Endoscopy;  Laterality: N/A;  . dental implants     dental post implanted to hold dentures/partials in place  . ENDARTERECTOMY Left 03/10/2015   Procedure: Left Carotid ENDARTERECTOMY ;  Surgeon: Rosetta Posner, MD;  Location: [facility];  Service: Vascular;  Laterality: Left;  . FRACTURE SURGERY  1958   Left hand   . HERNIA REPAIR     Bilateral inguinal   . hernia repair b/l inguinal    . knee  arthroscopy left    . VASCULAR SURGERY    . VASECTOMY      Current Medications: Outpatient Medications Prior to Visit  Medication Sig Dispense Refill  . acetaminophen (TYLENOL) 500 MG tablet Take 500 mg by mouth daily as needed (pain). Reported on 03/30/2016    . amLODipine (NORVASC) 10 MG tablet Take 10 mg by mouth daily.    [name] Kitchen aspirin EC 81 MG tablet Take 81 mg by mouth at bedtime.    [name] Kitchen atorvastatin (LIPITOR) 20 MG tablet Take 20 mg by mouth at bedtime.     . chlorhexidine (PERIDEX) 0.12 % solution Use as directed 15 mLs in the mouth or throat 2 (two) times daily as needed (mouth sores). Swish and spit    . cyclobenzaprine (FLEXERIL) 5 MG tablet Take 2.5 mg by mouth daily as needed for muscle spasms.     [name] Kitchen glimepiride (AMARYL) 4 MG tablet Take 4 mg by mouth daily with breakfast.    . metFORMIN (GLUCOPHAGE) 500 MG tablet Take 500 mg by mouth 4 (four) times daily.     . Omega-3 Fatty Acids (FISH OIL) 1000 MG CAPS Take 2,000 mg by mouth 2 (two) times daily.     [name] Kitchen omeprazole (PRILOSEC) 20 MG capsule Take 20 mg by mouth every other day.     . ONE TOUCH ULTRA TEST test strip     .  ONETOUCH DELICA LANCETS 99991111 MISC     . sildenafil (REVATIO) 20 MG tablet Take 60-100 mg by mouth daily as needed (erectile  dysfunction).     . sitaGLIPtin (JANUVIA) 100 MG tablet Take 100 mg by mouth daily with breakfast.    . spironolactone (ALDACTONE) 25 MG tablet TAKE 1/2 TABLET BY MOUTH DAILY 15 tablet 1  . telmisartan (MICARDIS) 80 MG tablet Take 80 mg by mouth at bedtime.     . Testosterone 30 MG/ACT SOLN Place 30 mg onto the skin daily. AXIRON    . VITAMIN D, ERGOCALCIFEROL, PO Take 1 capsule by mouth daily.      No facility-administered medications prior to visit.      Allergies:   Patient has no known allergies.   Social History   Social History  . Marital status: Married    Spouse name: N/A  . Number of children: N/A  . Years of education: N/A   Social History Main Topics  . Smoking status:  Never Smoker  . Smokeless tobacco: Never Used  . Alcohol use 0.0 oz/week     Comment: rarely  . Drug use: No  . Sexual activity: Yes    Birth control/ protection: None   Other Topics Concern  . None   Social History Narrative  . None     Family History:  The patient's family history includes CVA in his mother; Cancer in his daughter; Diabetes in his brother; Heart disease in his brother and father; Peripheral vascular disease in his mother.   ROS:   Please see the history of present illness.    No complaints.  All other systems reviewed and are negative.   PHYSICAL EXAM:   VS:  BP 138/72 (BP Location: Left Arm)   Pulse 61   Ht 5\' 10"  (1.778 m)   Wt 185 lb 12.8 oz (84.3 kg)   BMI 26.66 kg/m    GEN: Well nourished, well developed, in no acute distress  HEENT: normal  Neck: no JVD, carotid bruits, or masses Cardiac: RRR. 3/6 crescendo decrescendo systolic murmur of AST. Unchanged from prior auscultation. There are no rubs, or gallops,no edema . Respiratory:  clear to auscultation bilaterally, normal work of breathing GI: soft, nontender, nondistended, + BS MS: no deformity or atrophy  Skin: warm and dry, no rash Neuro:  Alert and Oriented x 3, Strength and sensation are intact Psych: euthymic mood, full affect  Wt Readings from Last 3 Encounters:  10/20/16 185 lb 12.8 oz (84.3 kg)  03/30/16 186 lb 11.2 oz (84.7 kg)  11/05/15 185 lb 1.6 oz (84 kg)      Studies/Labs Reviewed:   EKG:  EKG  Normal sinus rhythm with nonspecific T-wave flattening. No change compared to prior.  Recent Labs: No results found for requested labs within last 8760 hours.   Lipid Panel No results found for: CHOL, TRIG, HDL, CHOLHDL, VLDL, LDLCALC, LDLDIRECT  Additional studies/ records that were reviewed today include:  Echocardiogram September 2017:  Study Conclusions   - Left ventricle: The cavity size was normal. Wall thickness was   increased in a pattern of mild LVH. Systolic  function was   vigorous. The estimated ejection fraction was in the range of 65%   to 70%. Doppler parameters are consistent with abnormal left   ventricular relaxation (grade 1 diastolic dysfunction). - Aortic valve: AV is thickened, calcified. Difficult to see   leaflets well. Peak and mean gradients through the valve are 32   and  20 mm Hg respectively consistent with mild to moderate AS. No   significant change in mean gradient from exam of 2016. - Mitral valve: Calcified annulus. Mildly thickened, mildly   calcified leaflets . - Left atrium: The atrium was moderately dilated. - Right ventricle: The cavity size was mildly dilated. - Right atrium: The atrium was mildly dilated.       ASSESSMENT:    1. Aortic stenosis, moderate   2. Angina pectoris ([place])   3. Essential hypertension, uncontrolled   4. Left carotid artery stenosis   5. Dyslipidemia   6. Type 2 diabetes mellitus with complication, without long-term current use of insulin ([facility])      PLAN:  In order of problems listed above:  1. Stable based on the most recent echo. He is not having any limiting symptoms. Plan clinical follow-up in one year. Next echo will be done based upon that examination. We will probably skip a year before repeating since the valve has been relatively stable over the last 12 months. 2. Stable exertional chest tightness. Not interfering with activity. 3. Excellent blood pressure control. Targets are 140/90 mmHg. 4. Asymptomatic post carotid endarterectomy. 5. Followed by primary care. Target his LDL less than 70.    Medication Adjustments/Labs and Tests Ordered: Current medicines are reviewed at length with the patient today.  Concerns regarding medicines are outlined above.  Medication changes, Labs and Tests ordered today are listed in the Patient Instructions below. There are no Patient Instructions on file for this visit.   Signed, Sinclair Grooms, MD  10/20/2016 9:59 AM    [facility] [address], [place], [place]  [address] Phone: [phone]; Fax: [phone]

## 2016-10-31 ENCOUNTER — Other Ambulatory Visit: Payer: Self-pay | Admitting: Interventional Cardiology

## 2016-10-31 DIAGNOSIS — I10 Essential (primary) hypertension: Secondary | ICD-10-CM

## 2016-12-24 ENCOUNTER — Encounter: Payer: Self-pay | Admitting: Cardiology

## 2017-02-23 ENCOUNTER — Ambulatory Visit (HOSPITAL_COMMUNITY)
Admission: RE | Admit: 2017-02-23 | Discharge: 2017-02-23 | Disposition: A | Discharge status: Home / Self Care | Payer: Medicare Other | Source: Ambulatory Visit | Attending: Orthopedic Surgery | Admitting: Orthopedic Surgery

## 2017-02-23 ENCOUNTER — Other Ambulatory Visit (HOSPITAL_COMMUNITY): Payer: Self-pay | Admitting: Orthopedic Surgery

## 2017-02-23 DIAGNOSIS — Z01818 Encounter for other preprocedural examination: Secondary | ICD-10-CM | POA: Diagnosis present

## 2017-02-23 DIAGNOSIS — M25511 Pain in right shoulder: Secondary | ICD-10-CM

## 2017-04-12 ENCOUNTER — Ambulatory Visit: Payer: Medicare Other | Admitting: Family

## 2017-04-12 ENCOUNTER — Encounter (HOSPITAL_COMMUNITY): Payer: Medicare Other

## 2017-04-26 ENCOUNTER — Encounter: Payer: Self-pay | Admitting: Family

## 2017-04-29 ENCOUNTER — Encounter: Payer: Self-pay | Admitting: Family

## 2017-04-29 ENCOUNTER — Ambulatory Visit (HOSPITAL_COMMUNITY)
Admission: RE | Admit: 2017-04-29 | Discharge: 2017-04-29 | Disposition: A | Discharge status: Home / Self Care | Payer: Medicare Other | Source: Ambulatory Visit | Attending: Family | Admitting: Family

## 2017-04-29 ENCOUNTER — Ambulatory Visit (INDEPENDENT_AMBULATORY_CARE_PROVIDER_SITE_OTHER): Payer: Medicare Other | Admitting: Family

## 2017-04-29 VITALS — BP 134/70 | HR 69 | Temp 97.1°F | Resp 18 | Ht 70.0 in | Wt 186.0 lb

## 2017-04-29 DIAGNOSIS — Z48812 Encounter for surgical aftercare following surgery on the circulatory system: Secondary | ICD-10-CM | POA: Diagnosis not present

## 2017-04-29 DIAGNOSIS — I6522 Occlusion and stenosis of left carotid artery: Secondary | ICD-10-CM

## 2017-04-29 DIAGNOSIS — I6521 Occlusion and stenosis of right carotid artery: Secondary | ICD-10-CM | POA: Diagnosis not present

## 2017-04-29 DIAGNOSIS — Z9889 Other specified postprocedural states: Secondary | ICD-10-CM | POA: Diagnosis not present

## 2017-04-29 LAB — VAS US CAROTID
LCCAPSYS: 157 cm/s
LEFT ECA DIAS: 0 cm/s
LEFT VERTEBRAL DIAS: -11 cm/s
LICADDIAS: -17 cm/s
Left CCA dist dias: -13 cm/s
Left CCA dist sys: -128 cm/s
Left CCA prox dias: 3 cm/s
Left ICA dist sys: -73 cm/s
Left ICA prox dias: -12 cm/s
Left ICA prox sys: -70 cm/s
RCCADSYS: -115 cm/s
RCCAPDIAS: 14 cm/s
RIGHT CCA MID DIAS: 9 cm/s
RIGHT ECA DIAS: 0 cm/s
RIGHT VERTEBRAL DIAS: 18 cm/s
Right CCA prox sys: 148 cm/s

## 2017-04-29 NOTE — Progress Notes (Signed)
Chief Complaint: Follow up Extracranial Carotid Artery Stenosis   History of Present Illness  Daniel Mendez is a 76 y.o. male patient of Dr. Donnetta Hutching who is s/p left carotid endarterectomy for severe asymptomatic disease on 03/10/2015. He returns today for follow up. The patient denies any history of TIA or stroke symptoms, specifically he denies a history of amaurosis fugax or monocular blindness, unilateral facial drooping, hemiplegia, or receptive or expressive aphasia.  Pt denies claudication symptoms with walking. He stays physically active, states he exercises daily.    He has a known heart murmur, aortic stenosis, denies feeling symptomatic with this unless he walks very fast, he will then feel mildly dyspneic.  He had a right rotator cuff repair in April 2018, will start physical therapy soon.   Pt Diabetic: yes, states his last A1C was 7.1? Pt smoker: non-smoker  Pt meds include: Statin : yes ASA: yes Other anticoagulants/antiplatelets: no   Past Medical History:  Diagnosis Date  . Anginal pain ([place])   . Arthritis    hands  . Coronary artery disease   . Diabetes ([place])    type 2  . GERD (gastroesophageal reflux disease)   . Heart murmur    discovered in 2015 - followed by Dr. Daneen Schick  . History of hiatal hernia   . HTN (hypertension)   . Hypercalcemia   . Hyperglyceridemia   . Hyperlipidemia   . Kidney stone   . Mild aortic stenosis Oct 2014  . Peripheral vascular disease ([place])   . Shortness of breath dyspnea    on exertion  . Sleep apnea    no cpap    Social History Social History  Substance Use Topics  . Smoking status: Never Smoker  . Smokeless tobacco: Never Used  . Alcohol use 0.0 oz/week     Comment: rarely    Family History Family History  Problem Relation Age of Onset  . CVA Mother   . Peripheral vascular disease Mother   . Heart disease Father   . Heart disease Brother   . Diabetes Brother   . Cancer Daughter      Surgical History Past Surgical History:  Procedure Laterality Date  . COLONOSCOPY WITH PROPOFOL N/A 11/19/2014   Procedure: COLONOSCOPY WITH PROPOFOL;  Surgeon: Garlan Fair, MD;  Location: [facility];  Service: Endoscopy;  Laterality: N/A;  . dental implants     dental post implanted to hold dentures/partials in place  . ENDARTERECTOMY Left 03/10/2015   Procedure: Left Carotid ENDARTERECTOMY ;  Surgeon: Rosetta Posner, MD;  Location: [facility];  Service: Vascular;  Laterality: Left;  . FRACTURE SURGERY  1958   Left hand   . HERNIA REPAIR     Bilateral inguinal   . hernia repair b/l inguinal    . knee arthroscopy left    . ROTATOR CUFF REPAIR    . VASCULAR SURGERY    . VASECTOMY      No Known Allergies  Current Outpatient Prescriptions  Medication Sig Dispense Refill  . acetaminophen (TYLENOL) 500 MG tablet Take 500 mg by mouth daily as needed (pain). Reported on 03/30/2016    . amLODipine (NORVASC) 10 MG tablet Take 10 mg by mouth daily.    [name] Kitchen aspirin EC 81 MG tablet Take 81 mg by mouth at bedtime.    [name] Kitchen atorvastatin (LIPITOR) 20 MG tablet Take 20 mg by mouth at bedtime.     . chlorhexidine (PERIDEX) 0.12 % solution Use as directed 15  mLs in the mouth or throat 2 (two) times daily as needed (mouth sores). Swish and spit    . cyclobenzaprine (FLEXERIL) 5 MG tablet Take 2.5 mg by mouth daily as needed for muscle spasms.     [name] Kitchen glimepiride (AMARYL) 4 MG tablet Take 4 mg by mouth daily with breakfast.    . metFORMIN (GLUCOPHAGE) 500 MG tablet Take 500 mg by mouth 4 (four) times daily.     . Omega-3 Fatty Acids (FISH OIL) 1000 MG CAPS Take 2,000 mg by mouth 2 (two) times daily.     [name] Kitchen omeprazole (PRILOSEC) 20 MG capsule Take 20 mg by mouth every other day.     . ONE TOUCH ULTRA TEST test strip     . ONETOUCH DELICA LANCETS 77O MISC     . sildenafil (REVATIO) 20 MG tablet Take 60-100 mg by mouth daily as needed (erectile  dysfunction).     . sitaGLIPtin (JANUVIA) 100 MG tablet Take 100  mg by mouth daily with breakfast.    . spironolactone (ALDACTONE) 25 MG tablet Take 0.5 tablets (12.5 mg total) by mouth daily. 15 tablet 10  . telmisartan (MICARDIS) 80 MG tablet Take 80 mg by mouth at bedtime.     . Testosterone 30 MG/ACT SOLN Place 30 mg onto the skin daily. AXIRON    . VITAMIN D, ERGOCALCIFEROL, PO Take 1 capsule by mouth daily.      No current facility-administered medications for this visit.     Review of Systems : See HPI for pertinent positives and negatives.  Physical Examination  Vitals:   04/29/17 1452 04/29/17 1453  BP: 125/73 134/70  Pulse: 69   Resp: 18   Temp: 97.1 F (36.2 C)   TempSrc: Oral   SpO2: 93%   Weight: 186 lb (84.4 kg)   Height: 5\' 10"  (1.778 m)    Body mass index is 26.69 kg/m.  General: WDWN male in NAD GAIT: normal Eyes: PERRLA Pulmonary:  Non-labored respirations, limited air movement in all posterior fields, CTAB, no rales,  rhonchi, or wheezing.  Cardiac: regular rhythm, +murmur.  VASCULAR EXAM Carotid Bruits Right Left   Negative Positive    Aorta is not palpable. Radial pulses are 2+ palpable and equal.                                                                                                                                          LE Pulses Right Left       POPLITEAL  not palpable   not palpable       POSTERIOR TIBIAL   palpable   palpable        DORSALIS PEDIS      ANTERIOR TIBIAL  palpable   palpable     Gastrointestinal: soft, nontender, BS WNL, no r/g, no palpable masses.  Musculoskeletal: no muscle atrophy/wasting. M/S 5/5 throughout, extremities without ischemic  changes.  Neurologic: A&O X 3; Appropriate Affect, Speech is normal, CN 2-12 intact, pain and light touch intact in extremities, Motor exam as listed above.     Assessment: Daniel Mendez is a 76 y.o. male who is s/p left carotid endarterectomy for severe asymptomatic disease on 03/10/2015. He has no history of  stroke or TIA.  His atherosclerotic risk factors include well controlled DM. Fortunately he has never used tobacco.   DATA Today's carotid duplex indicates 40-59% right ICA stenosis and no restenosis of the left ICA (CEA site).  Bilateral vertebral artery flow is antegrade.  Bilateral subclavian artery waveforms are normal.  Increased stenosis of the right ICA compared to the last exam on 03-30-16.   Plan:  Follow-up in 1 year with Carotid Duplex scan.   I discussed in depth with the patient the nature of atherosclerosis, and emphasized the importance of maximal medical management including strict control of blood pressure, blood glucose, and lipid levels, obtaining regular exercise, and continued cessation of smoking.  The patient is aware that without maximal medical management the underlying atherosclerotic disease process will progress, limiting the benefit of any interventions. The patient was given information about stroke prevention and what symptoms should prompt the patient to seek immediate medical care. Thank you for allowing [place] to participate in this patient's care.  Clemon Chambers, RN, MSN, FNP-C [facility] Office: [facility] Physician: Bridgett Larsson  04/29/17 3:00 PM

## 2017-04-29 NOTE — Patient Instructions (Signed)
Stroke Prevention Some medical conditions and behaviors are associated with an increased chance of having a stroke. You may prevent a stroke by making healthy choices and managing medical conditions. How can I reduce my risk of having a stroke?  Stay physically active. Get at least 30 minutes of activity on most or all days.  Do not smoke. It may also be helpful to avoid exposure to secondhand smoke.  Limit alcohol use. Moderate alcohol use is considered to be: ? No more than 2 drinks per day for men. ? No more than 1 drink per day for nonpregnant women.  Eat healthy foods. This involves: ? Eating 5 or more servings of fruits and vegetables a day. ? Making dietary changes that address high blood pressure (hypertension), high cholesterol, diabetes, or obesity.  Manage your cholesterol levels. ? Making food choices that are high in fiber and low in saturated fat, trans fat, and cholesterol may control cholesterol levels. ? Take any prescribed medicines to control cholesterol as directed by your health care provider.  Manage your diabetes. ? Controlling your carbohydrate and sugar intake is recommended to manage diabetes. ? Take any prescribed medicines to control diabetes as directed by your health care provider.  Control your hypertension. ? Making food choices that are low in salt (sodium), saturated fat, trans fat, and cholesterol is recommended to manage hypertension. ? Ask your health care provider if you need treatment to lower your blood pressure. Take any prescribed medicines to control hypertension as directed by your health care provider. ? If you are 18-39 years of age, have your blood pressure checked every 3-5 years. If you are 40 years of age or older, have your blood pressure checked every year.  Maintain a healthy weight. ? Reducing calorie intake and making food choices that are low in sodium, saturated fat, trans fat, and cholesterol are recommended to manage  weight.  Stop drug abuse.  Avoid taking birth control pills. ? Talk to your health care provider about the risks of taking birth control pills if you are over 35 years old, smoke, get migraines, or have ever had a blood clot.  Get evaluated for sleep disorders (sleep apnea). ? Talk to your health care provider about getting a sleep evaluation if you snore a lot or have excessive sleepiness.  Take medicines only as directed by your health care provider. ? For some people, aspirin or blood thinners (anticoagulants) are helpful in reducing the risk of forming abnormal blood clots that can lead to stroke. If you have the irregular heart rhythm of atrial fibrillation, you should be on a blood thinner unless there is a good reason you cannot take them. ? Understand all your medicine instructions.  Make sure that other conditions (such as anemia or atherosclerosis) are addressed. Get help right away if:  You have sudden weakness or numbness of the face, arm, or leg, especially on one side of the body.  Your face or eyelid droops to one side.  You have sudden confusion.  You have trouble speaking (aphasia) or understanding.  You have sudden trouble seeing in one or both eyes.  You have sudden trouble walking.  You have dizziness.  You have a loss of balance or coordination.  You have a sudden, severe headache with no known cause.  You have new chest pain or an irregular heartbeat. Any of these symptoms may represent a serious problem that is an emergency. Do not wait to see if the symptoms will go away.   Get medical help at once. Call your local emergency services (911 in [place] Do not drive yourself to the hospital. This information is not intended to replace advice given to you by your health care provider. Make sure you discuss any questions you have with your health care provider. Document Released: 12/16/2004 Document Revised: 04/15/2016 Document Reviewed: 05/11/2013 Elsevier  Interactive Patient Education  2017 Elsevier Inc.     Preventing Cerebrovascular Disease Arteries are blood vessels that carry blood that contains oxygen from the heart to all parts of the body. Cerebrovascular disease affects arteries that supply the brain. Any condition that blocks or disrupts blood flow to the brain can cause cerebrovascular disease. Brain cells that lose blood supply start to die within minutes (stroke). Stroke is the main danger of cerebrovascular disease. Atherosclerosis and high blood pressure are common causes of cerebrovascular disease. Atherosclerosis is narrowing and hardening of an artery that results when fat, cholesterol, calcium, or other substances (plaque) build up inside an artery. Plaque reduces blood flow through the artery. High blood pressure increases the risk of bleeding inside the brain. Making diet and lifestyle changes to prevent atherosclerosis and high blood pressure lowers your risk of cerebrovascular disease. What nutrition changes can be made?  Eat more fruits, vegetables, and whole grains.  Reduce how much saturated fat you eat. To do this, eat less red meat and fewer full-fat dairy products.  Eat healthy proteins instead of red meat. Healthy proteins include: ? Fish. Eat fish that contains heart-healthy omega-3 fatty acids, twice a week. Examples include salmon, albacore tuna, mackerel, and herring. ? Chicken. ? Nuts. ? Low-fat or nonfat yogurt.  Avoid processed meats, like bacon and lunchmeat.  Avoid foods that contain: ? A lot of sugar, such as sweets and drinks with added sugar. ? A lot of salt (sodium). Avoid adding extra salt to your food, as told by your health care provider. ? Trans fats, such as margarine and baked goods. Trans fats may be listed as "partially hydrogenated oils" on food labels.  Check food labels to see how much sodium, sugar, and trans fats are in foods.  Use vegetable oils that contain low amounts of  saturated fat, such as olive oil or canola oil. What lifestyle changes can be made?  Drink alcohol in moderation. This means no more than 1 drink a day for nonpregnant women and 2 drinks a day for men. One drink equals 12 oz of beer, 5 oz of wine, or 1 oz of hard liquor.  If you are overweight, ask your health care provider to recommend a weight-loss plan for you. Losing 5-10 lb (2.2-4.5 kg) can reduce your risk of diabetes, atherosclerosis, and high blood pressure.  Exercise for 30?60 minutes on most days, or as much as told by your health care provider. ? Do moderate-intensity exercise, such as brisk walking, bicycling, and water aerobics. Ask your health care provider which activities are safe for you.  Do not use any products that contain nicotine or tobacco, such as cigarettes and e-cigarettes. If you need help quitting, ask your health care provider. Why are these changes important? Making these changes lowers your risk of many diseases that can cause cerebrovascular disease and stroke. Stroke is a leading cause of death and disability. Making these changes also improves your overall health and quality of life. What can I do to lower my risk? The following factors make you more likely to develop cerebrovascular disease:  Being overweight.  Smoking.  Being physically inactive.    Eating a high-fat diet.  Having certain health conditions, such as: ? Diabetes. ? High blood pressure. ? Heart disease. ? Atherosclerosis. ? High cholesterol. ? Sickle cell disease.  Talk with your health care provider about your risk for cerebrovascular disease. Work with your health care provider to control diseases that you have that may contribute to cerebrovascular disease. Your health care provider may prescribe medicines to help prevent major causes of cerebrovascular disease. Where to find more information: Learn more about preventing cerebrovascular disease from:  National Heart, Lung, and  Blood Institute: www.nhlbi.nih.gov/health/health-topics/topics/stroke  Centers for Disease Control and Prevention: cdc.gov/stroke/about.htm  Summary  Cerebrovascular disease can lead to a stroke.  Atherosclerosis and high blood pressure are major causes of cerebrovascular disease.  Making diet and lifestyle changes can reduce your risk of cerebrovascular disease.  Work with your health care provider to get your risk factors under control to reduce your risk of cerebrovascular disease. This information is not intended to replace advice given to you by your health care provider. Make sure you discuss any questions you have with your health care provider. Document Released: 11/23/2015 Document Revised: 05/28/2016 Document Reviewed: 11/23/2015 Elsevier Interactive Patient Education  2018 Elsevier Inc.  

## 2017-05-13 NOTE — Addendum Note (Signed)
Addended by: Lianne Cure A on: 05/13/2017 10:55 AM   Modules accepted: Orders

## 2017-07-11 ENCOUNTER — Ambulatory Visit: Payer: Medicare Other | Admitting: Interventional Cardiology

## 2017-08-09 ENCOUNTER — Ambulatory Visit (INDEPENDENT_AMBULATORY_CARE_PROVIDER_SITE_OTHER): Payer: Medicare Other | Admitting: Nurse Practitioner

## 2017-08-09 ENCOUNTER — Encounter: Payer: Self-pay | Admitting: Nurse Practitioner

## 2017-08-09 VITALS — BP 142/76 | HR 82 | Ht 70.0 in | Wt 184.1 lb

## 2017-08-09 DIAGNOSIS — I350 Nonrheumatic aortic (valve) stenosis: Secondary | ICD-10-CM | POA: Diagnosis not present

## 2017-08-09 DIAGNOSIS — E785 Hyperlipidemia, unspecified: Secondary | ICD-10-CM | POA: Diagnosis not present

## 2017-08-09 DIAGNOSIS — R9439 Abnormal result of other cardiovascular function study: Secondary | ICD-10-CM | POA: Diagnosis not present

## 2017-08-09 DIAGNOSIS — I209 Angina pectoris, unspecified: Secondary | ICD-10-CM | POA: Diagnosis not present

## 2017-08-09 DIAGNOSIS — Z9889 Other specified postprocedural states: Secondary | ICD-10-CM

## 2017-08-09 LAB — BASIC METABOLIC PANEL
BUN/Creatinine Ratio: 12 (ref 10–24)
BUN: 15 mg/dL (ref 8–27)
CO2: 24 mmol/L (ref 20–29)
Calcium: 10.7 mg/dL — ABNORMAL HIGH (ref 8.6–10.2)
Chloride: 98 mmol/L (ref 96–106)
Creatinine, Ser: 1.25 mg/dL (ref 0.76–1.27)
GFR calc Af Amer: 63 mL/min/{1.73_m2} (ref >59)
GFR calc non Af Amer: 55 mL/min/{1.73_m2} — ABNORMAL LOW (ref >59)
Glucose: 220 mg/dL — ABNORMAL HIGH (ref 65–99)
Potassium: 4.9 mmol/L (ref 3.5–5.2)
Sodium: 136 mmol/L (ref 134–144)

## 2017-08-09 LAB — CBC
Hematocrit: 38.9 % (ref 37.5–51.0)
Hemoglobin: 12.9 g/dL — ABNORMAL LOW (ref 13.0–17.7)
MCH: 27.3 pg (ref 26.6–33.0)
MCHC: 33.2 g/dL (ref 31.5–35.7)
MCV: 82 fL (ref 79–97)
Platelets: 181 10*3/uL (ref 150–379)
RBC: 4.73 x10E6/uL (ref 4.14–5.80)
RDW: 14 % (ref 12.3–15.4)
WBC: 8.3 10*3/uL (ref 3.4–10.8)

## 2017-08-09 LAB — PROTIME-INR
INR: 1 (ref 0.8–1.2)
Prothrombin Time: 9.9 s (ref 9.1–12.0)

## 2017-08-09 LAB — APTT: aPTT: 27 s (ref 24–33)

## 2017-08-09 NOTE — Patient Instructions (Addendum)
We will be checking the following labs today - BMET, CBC, PT, PTT  Medication Instructions:    Continue with your current medicines.     Testing/Procedures To Be Arranged:  Echocardiogram  Cardiac catheterization  Follow-Up:   Will see how your studies turn out.     Other Special Instructions:   Your provider has recommended a cardiac catherization  You are scheduled for a cardiac catheterization on Friday, September 21st at 12 noon with Dr. [name] or associate.  Please arrive at the [facility] (Main Entrance) at [facility] at [facility] on Friday, September 21st at 10 AM.    Special note: Every effort is made to have your procedure done on time.   Please understand that emergencies sometimes delay a scheduled   procedure.  No food or drink after midnight on Thursday.  You may take your morning medications with a sip of water on the day of your procedure EXCEPT FOR YOUR DIABETIC MEDICINES.  Please take a baby aspirin (81 mg) on the morning of your procedure.   Medications to [place] for a one night stay -- bring personal belongings.  Bring a current list of your medications and current insurance cards.  You MUST have a responsible person to drive you home. Someone MUST be with you the first 24 hours after you arrive home or your discharge will be delayed. Wear clothes that are easy to get on and off and wear slip on shoes.    Coronary Angiogram A coronary angiogram, also called coronary angiography, is an X-ray procedure used to look at the arteries in the heart. In this procedure, a dye (contrast dye) is injected through a long, hollow tube (catheter). The catheter is about the size of a piece of cooked spaghetti and is inserted through your groin, wrist, or arm. The dye is injected into each artery, and X-rays are then taken to show if there is a  blockage in the arteries of your heart.  LET [facility] CARE PROVIDER KNOW ABOUT: Any allergies you have, including allergies to shellfish or contrast dye.  All medicines you are taking, including vitamins, herbs, eye drops, creams, and over-the-counter medicines.  Previous problems you or members of your family have had with the use of anesthetics.  Any blood disorders you have.  Previous surgeries you have had. History of kidney problems or failure.  Other medical conditions you have.  RISKS AND COMPLICATIONS  Generally, a coronary angiogram is a safe procedure. However, about 1 person out of 1000 can have problems that may include: Allergic reaction to the dye. Bleeding/bruising from the access site or other locations. Kidney injury, especially in people with impaired kidney function. Stroke (rare). Heart attack (rare). Irregular rhythms (rare) Death (rare)  BEFORE THE PROCEDURE  Do not eat or drink anything after midnight the night before the procedure or as directed by your health care provider.  Ask your health care provider about changing or stopping your regular medicines. This is especially important if you are taking diabetes medicines or blood thinners.  PROCEDURE You may be given a medicine to help you relax (sedative) before the procedure. This medicine is given through an intravenous (IV) access tube that is inserted into one of your veins.  The area where the catheter will be inserted will be washed and shaved. This is usually done in the  groin but may be done in the fold of your arm (near your elbow) or in the wrist.  A medicine will be given to numb the area where the catheter will be inserted (local anesthetic).  The health care provider will insert the catheter into an artery. The catheter will be guided by using a special type of X-ray (fluoroscopy) of the blood vessel being examined.  A special dye will then be injected into the catheter, and X-rays  will be taken. The dye will help to show where any narrowing or blockages are located in the heart arteries.    AFTER THE PROCEDURE  If the procedure is done through the leg, you will be kept in bed lying flat for several hours. You will be instructed to not bend or cross your legs. The insertion site will be checked frequently.  The pulse in your feet or wrist will be checked frequently.  Additional blood tests, X-rays, and an electrocardiogram may be done.         If you need a refill on your cardiac medications before your next appointment, please call your pharmacy.   Call the [place] office at [phone] if you have any questions, problems or concerns.

## 2017-08-09 NOTE — Progress Notes (Signed)
CARDIOLOGY OFFICE NOTE  Date:  08/09/2017    Daniel Mendez Date of Birth: 06/06/1941 Medical Record #[id number]  PCP:  Daniel Carol, MD  Cardiologist:  Daniel Mendez    Chief Complaint  Patient presents with  . Coronary Artery Disease  . Aortic Stenosis    Follow up visit - seen for Dr. Tamala Mendez    History of Present Illness: Daniel Mendez is a 76 y.o. male who presents today for a follow up visit. Seen for Dr. Tamala Mendez.   He has a history of PAD with prior left carotid endarterectomy, moderate aortic stenosis, suspected CAD based on past abnormal Cardiolite (asymptomatic) December 2014, essential hypertension, hypercalcemia and hyperlipidemia.   Last seen here back in November by Dr. Tamala Mendez - some very minimal chest tightness noted with only heavy exertion - otherwise felt to be doing ok. Dr. Tamala Mendez felt like he was doing ok.   Comes in today. Here alone. He had to miss his appointment due to speaking at a funeral. Says he is ok - has a cold - but moreso, has noticed a change since last here. Little more chest tightness that is easier to "bring on". Notes it more with less exertion such as being on the treadmill. More fatigue - sometimes just has "to stop" - this is not like him. Little more shortness of breath as well. No syncope noted.   Past Medical History:  Diagnosis Date  . Anginal pain ([place])   . Arthritis    hands  . Coronary artery disease   . Diabetes ([place])    type 2  . GERD (gastroesophageal reflux disease)   . Heart murmur    discovered in 2015 - followed by Dr. Daneen Mendez  . History of hiatal hernia   . HTN (hypertension)   . Hypercalcemia   . Hyperglyceridemia   . Hyperlipidemia   . Kidney stone   . Mild aortic stenosis Oct 2014  . Peripheral vascular disease ([place])   . Shortness of breath dyspnea    on exertion  . Sleep apnea    no cpap    Past Surgical History:  Procedure Laterality Date  . COLONOSCOPY WITH PROPOFOL N/A 11/19/2014   Procedure:  COLONOSCOPY WITH PROPOFOL;  Surgeon: Garlan Fair, MD;  Location: [facility];  Service: Endoscopy;  Laterality: N/A;  . dental implants     dental post implanted to hold dentures/partials in place  . ENDARTERECTOMY Left 03/10/2015   Procedure: Left Carotid ENDARTERECTOMY ;  Surgeon: Rosetta Posner, MD;  Location: [facility];  Service: Vascular;  Laterality: Left;  . FRACTURE SURGERY  1958   Left hand   . HERNIA REPAIR     Bilateral inguinal   . hernia repair b/l inguinal    . knee arthroscopy left    . ROTATOR CUFF REPAIR    . VASCULAR SURGERY    . VASECTOMY       Medications: Current Meds  Medication Sig  . acetaminophen (TYLENOL) 500 MG tablet Take 500 mg by mouth daily as needed (pain). Reported on 03/30/2016  . amLODipine (NORVASC) 10 MG tablet Take 10 mg by mouth daily.  [name] Kitchen aspirin EC 81 MG tablet Take 81 mg by mouth at bedtime.  [name] Kitchen atorvastatin (LIPITOR) 20 MG tablet Take 20 mg by mouth at bedtime.   . chlorhexidine (PERIDEX) 0.12 % solution Use as directed 15 mLs in the mouth or throat 2 (two) times daily as needed (mouth sores). Swish and spit  .  cyclobenzaprine (FLEXERIL) 5 MG tablet Take 2.5 mg by mouth daily as needed for muscle spasms.   [name] Kitchen glimepiride (AMARYL) 4 MG tablet Take 4 mg by mouth daily with breakfast.  . metFORMIN (GLUCOPHAGE) 500 MG tablet Take 500 mg by mouth 4 (four) times daily.   . Omega-3 Fatty Acids (FISH OIL) 1000 MG CAPS Take 2,000 mg by mouth 2 (two) times daily.   [name] Kitchen omeprazole (PRILOSEC) 20 MG capsule Take 20 mg by mouth every other day.   . ONE TOUCH ULTRA TEST test strip   . ONETOUCH DELICA LANCETS 23N MISC   . sildenafil (REVATIO) 20 MG tablet Take 60-100 mg by mouth daily as needed (erectile  dysfunction).   . sitaGLIPtin (JANUVIA) 100 MG tablet Take 100 mg by mouth daily with breakfast.  . spironolactone (ALDACTONE) 25 MG tablet Take 0.5 tablets (12.5 mg total) by mouth daily.  [name] Kitchen telmisartan (MICARDIS) 80 MG tablet Take 80 mg by mouth at bedtime.        Allergies: No Known Allergies  Social History: The patient  reports that he has never smoked. He has never used smokeless tobacco. He reports that he drinks alcohol. He reports that he does not use drugs.   Family History: The patient's family history includes CVA in his mother; Cancer in his daughter; Diabetes in his brother; Heart disease in his brother and father; Peripheral vascular disease in his mother.   Review of Systems: Please see the history of present illness.   Otherwise, the review of systems is positive for none.   All other systems are reviewed and negative.   Physical Exam: VS:  BP (!) 142/76   Pulse 82   Ht 5\' 10"  (1.778 m)   Wt 184 lb 1.9 oz (83.5 kg)   BMI 26.42 kg/m  .  BMI Body mass index is 26.42 kg/m.  Wt Readings from Last 3 Encounters:  08/09/17 184 lb 1.9 oz (83.5 kg)  04/29/17 186 lb (84.4 kg)  10/20/16 185 lb 12.8 oz (84.3 kg)    General: Pleasant. Well developed, well nourished and in no acute distress.   HEENT: Normal.  Neck: Supple, no JVD, carotid bruits, or masses noted.  Cardiac: Regular rate and rhythm. Harsh outflow murmur noted.  No edema.  Respiratory:  Lungs are clear to auscultation bilaterally with normal work of breathing.  GI: Soft and nontender.  MS: No deformity or atrophy. Gait and ROM intact.  Skin: Warm and dry. Color is normal.  Neuro:  Strength and sensation are intact and no gross focal deficits noted.  Psych: Alert, appropriate and with normal affect.   LABORATORY DATA:  EKG:  EKG is ordered today. This shows sinus brady with nonspecific T wave changes - unchanged - reviewed with Dr. Tamala Mendez here in the office.   Lab Results  Component Value Date   WBC 7.7 03/11/2015   HGB 11.7 (L) 03/11/2015   HCT 36.0 (L) 03/11/2015   PLT 152 03/11/2015   GLUCOSE 150 (H) 09/03/2015   ALT 53 03/06/2015   AST 38 (H) 03/06/2015   NA 137 09/03/2015   K 4.4 09/03/2015   CL 102 09/03/2015   CREATININE 1.13 09/03/2015   BUN  20 09/03/2015   CO2 25 09/03/2015   INR 0.98 03/06/2015       BNP (last 3 results) No results for input(s): BNP in the last 8760 hours.  ProBNP (last 3 results) No results for input(s): PROBNP in the last 8760 hours.   Other  Studies Reviewed Today:  Echocardiogram September 2017:  Study Conclusions  - Left ventricle: The cavity size was normal. Wall thickness was increased in a pattern of mild LVH. Systolic function was vigorous. The estimated ejection fraction was in the range of 65% to 70%. Doppler parameters are consistent with abnormal left ventricular relaxation (grade 1 diastolic dysfunction). - Aortic valve: AV is thickened, calcified. Difficult to see leaflets well. Peak and mean gradients through the valve are 32 and 20 mm Hg respectively consistent with mild to moderate AS. No significant change in mean gradient from exam of 2016. - Mitral valve: Calcified annulus. Mildly thickened, mildly calcified leaflets . - Left atrium: The atrium was moderately dilated. - Right ventricle: The cavity size was mildly dilated. - Right atrium: The atrium was mildly dilated.  Assessment/Plan:  1. Presumed CAD - has multiple cardiac risk factors (gender/PAD/DM/HTN/HLD) now endorsing more chest tightness with exertion - noted prior abnormal Myoview from December of 2014 - discussed with Dr. Tamala Mendez here in the office this AM - recommended to proceed on with cardiac catheterization - left and right study. The patient understands that risks include but are not limited to stroke (1 in 1000), death (1 in 22), kidney failure [usually temporary] (1 in 500), bleeding (1 in 200), allergic reaction [possibly serious] (1 in 200), and agrees to proceed.   Arranged for this Friday with Dr. Tamala Mendez. Will hold Metformin. Lab today.   2. Moderate AS - having more angina, little more dyspnea - no syncope - will arrange for repeat echo. Left and right heart cath later this week.    3. HTN - repeat by me is ok at 130/70.   4. PAD - followed by [facility]  5. HLD - recent labs noted. LDL is 40  6. DM - last A1c noted.   Current medicines are reviewed with the patient today.  The patient does not have concerns regarding medicines other than what has been noted above.  The following changes have been made:  See above.  Labs/ tests ordered today include:    Orders Placed This Encounter  Procedures  . Basic metabolic panel  . CBC  . Protime-INR  . APTT  . EKG 12-Lead  . ECHOCARDIOGRAM COMPLETE     Disposition:   Further disposition pending above results.   Patient is agreeable to this plan and will call if any problems develop in the interim.   SignedTruitt Merle, NP  08/09/2017 9:30 AM  [facility] [address] [place],   [address] Phone: [phone] Fax: [phone]

## 2017-08-11 ENCOUNTER — Telehealth: Payer: Self-pay

## 2017-08-11 ENCOUNTER — Ambulatory Visit (HOSPITAL_COMMUNITY)
Admission: RE | Admit: 2017-08-11 | Discharge: 2017-08-11 | Disposition: A | Discharge status: Home / Self Care | Payer: Medicare Other | Source: Ambulatory Visit | Attending: Nurse Practitioner | Admitting: Nurse Practitioner

## 2017-08-11 DIAGNOSIS — I420 Dilated cardiomyopathy: Secondary | ICD-10-CM | POA: Insufficient documentation

## 2017-08-11 DIAGNOSIS — I060 Rheumatic aortic stenosis: Secondary | ICD-10-CM | POA: Insufficient documentation

## 2017-08-11 DIAGNOSIS — Z9889 Other specified postprocedural states: Secondary | ICD-10-CM | POA: Diagnosis not present

## 2017-08-11 DIAGNOSIS — I5030 Unspecified diastolic (congestive) heart failure: Secondary | ICD-10-CM | POA: Diagnosis not present

## 2017-08-11 DIAGNOSIS — E785 Hyperlipidemia, unspecified: Secondary | ICD-10-CM | POA: Diagnosis present

## 2017-08-11 DIAGNOSIS — I350 Nonrheumatic aortic (valve) stenosis: Secondary | ICD-10-CM

## 2017-08-11 DIAGNOSIS — R9439 Abnormal result of other cardiovascular function study: Secondary | ICD-10-CM

## 2017-08-11 DIAGNOSIS — I209 Angina pectoris, unspecified: Secondary | ICD-10-CM | POA: Diagnosis not present

## 2017-08-11 LAB — ECHOCARDIOGRAM COMPLETE
AO mean calculated velocity dopler: 216 cm/s
AV Area VTI index: 0.66 cm2/m2
AV Area VTI: 1.07 cm2
AV Area mean vel: 1.18 cm2
AV Mean grad: 22 mmHg
AV Peak grad: 47 mmHg
AV VEL mean LVOT/AV: 0.42
AV area mean vel ind: 0.59 cm2/m2
AV peak Index: 0.53
AV pk vel: 341 cm/s
AV vel: 1.33
Ao pk vel: 0.38 m/s
E decel time: 366 msec
E/e' ratio: 7.16
FS: 42 % (ref 28–44)
IVS/LV PW RATIO, ED: 1.02
LA ID, A-P, ES: 41 mm
LA diam end sys: 41 mm
LA diam index: 2.03 cm/m2
LA vol A4C: 74.8 ml
LA vol index: 35 mL/m2
LA vol: 70.8 mL
LV E/e' medial: 7.16
LV E/e'average: 7.16
LV PW d: 11.5 mm — AB (ref 0.6–1.1)
LV e' LATERAL: 12.2 cm/s
LVOT SV: 82 mL
LVOT VTI: 29 cm
LVOT area: 2.84 cm2
LVOT diameter: 19 mm
LVOT peak VTI: 0.47 cm
LVOT peak grad rest: 7 mmHg
LVOT peak vel: 128 cm/s
Lateral S' vel: 11.5 cm/s
MV Dec: 366
MV Peak grad: 3 mmHg
MV pk A vel: 85.7 m/s
MV pk E vel: 87.4 m/s
TAPSE: 18.9 mm
TDI e' lateral: 12.2
TDI e' medial: 7.83
VTI: 61.8 cm
Valve area index: 0.66
Valve area: 1.33 cm2

## 2017-08-11 NOTE — Progress Notes (Signed)
  Echocardiogram 2D Echocardiogram has been performed.  Daniel Mendez T Daniel Mendez 08/11/2017, 2:04 PM

## 2017-08-11 NOTE — Telephone Encounter (Signed)
Spoke with wife per DPR  Patient contacted pre-catheterization at [facility] scheduled for:  08/12/2017 @ 1200 Verified arrival time and place:  NT @ 1000 Confirmed AM meds to be taken pre-cath with sip of water: Take ASA Hold Amaryl, Januvia and spironolactone am of Hold Metformin-last dose 08/10/2017- confirmed Provided holding metformin education Confirmed patient has responsible person to drive home post procedure and observe patient for 24 hours:  yes Addl concerns:  All Pt wife questions answered

## 2017-08-12 ENCOUNTER — Encounter (HOSPITAL_COMMUNITY)
Admission: RE | Disposition: A | Discharge status: Home / Self Care | Payer: Self-pay | Source: Ambulatory Visit | Attending: Interventional Cardiology

## 2017-08-12 ENCOUNTER — Ambulatory Visit (HOSPITAL_COMMUNITY)
Admission: RE | Admit: 2017-08-12 | Discharge: 2017-08-12 | Disposition: A | Discharge status: Home / Self Care | Payer: Medicare Other | Source: Ambulatory Visit | Attending: Interventional Cardiology | Admitting: Interventional Cardiology

## 2017-08-12 DIAGNOSIS — M19042 Primary osteoarthritis, left hand: Secondary | ICD-10-CM | POA: Insufficient documentation

## 2017-08-12 DIAGNOSIS — I10 Essential (primary) hypertension: Secondary | ICD-10-CM | POA: Diagnosis present

## 2017-08-12 DIAGNOSIS — Z7984 Long term (current) use of oral hypoglycemic drugs: Secondary | ICD-10-CM | POA: Insufficient documentation

## 2017-08-12 DIAGNOSIS — G4730 Sleep apnea, unspecified: Secondary | ICD-10-CM | POA: Insufficient documentation

## 2017-08-12 DIAGNOSIS — Z823 Family history of stroke: Secondary | ICD-10-CM | POA: Diagnosis not present

## 2017-08-12 DIAGNOSIS — E118 Type 2 diabetes mellitus with unspecified complications: Secondary | ICD-10-CM | POA: Diagnosis present

## 2017-08-12 DIAGNOSIS — E785 Hyperlipidemia, unspecified: Secondary | ICD-10-CM | POA: Diagnosis present

## 2017-08-12 DIAGNOSIS — E1151 Type 2 diabetes mellitus with diabetic peripheral angiopathy without gangrene: Secondary | ICD-10-CM | POA: Insufficient documentation

## 2017-08-12 DIAGNOSIS — E781 Pure hyperglyceridemia: Secondary | ICD-10-CM | POA: Insufficient documentation

## 2017-08-12 DIAGNOSIS — I350 Nonrheumatic aortic (valve) stenosis: Secondary | ICD-10-CM

## 2017-08-12 DIAGNOSIS — I2510 Atherosclerotic heart disease of native coronary artery without angina pectoris: Secondary | ICD-10-CM | POA: Diagnosis present

## 2017-08-12 DIAGNOSIS — M19041 Primary osteoarthritis, right hand: Secondary | ICD-10-CM | POA: Insufficient documentation

## 2017-08-12 DIAGNOSIS — K219 Gastro-esophageal reflux disease without esophagitis: Secondary | ICD-10-CM | POA: Insufficient documentation

## 2017-08-12 DIAGNOSIS — Z7982 Long term (current) use of aspirin: Secondary | ICD-10-CM | POA: Insufficient documentation

## 2017-08-12 DIAGNOSIS — I25119 Atherosclerotic heart disease of native coronary artery with unspecified angina pectoris: Secondary | ICD-10-CM

## 2017-08-12 DIAGNOSIS — I209 Angina pectoris, unspecified: Secondary | ICD-10-CM | POA: Diagnosis present

## 2017-08-12 DIAGNOSIS — I2582 Chronic total occlusion of coronary artery: Secondary | ICD-10-CM | POA: Insufficient documentation

## 2017-08-12 HISTORY — PX: RIGHT/LEFT HEART CATH AND CORONARY ANGIOGRAPHY: CATH118266

## 2017-08-12 LAB — GLUCOSE, CAPILLARY
GLUCOSE-CAPILLARY: 177 mg/dL — AB (ref 65–99)
GLUCOSE-CAPILLARY: 135 mg/dL — AB (ref 65–99)
GLUCOSE-CAPILLARY: 111 mg/dL — AB (ref 65–99)
GLUCOSE-CAPILLARY: 84 mg/dL (ref 65–99)

## 2017-08-12 LAB — POCT I-STAT 3, VENOUS BLOOD GAS (G3P V)
ACID-BASE DEFICIT: 1 mmol/L (ref 0.0–2.0)
BICARBONATE: 25 mmol/L (ref 20.0–28.0)
O2 Saturation: 67 %
PH VEN: 7.348 (ref 7.250–7.430)
TCO2: 26 mmol/L (ref 22–32)
pCO2, Ven: 45.5 mmHg (ref 44.0–60.0)
pO2, Ven: 37 mmHg (ref 32.0–45.0)

## 2017-08-12 LAB — POCT I-STAT 3, ART BLOOD GAS (G3+)
Bicarbonate: 25.5 mmol/L (ref 20.0–28.0)
O2 Saturation: 98 %
PCO2 ART: 46.4 mmHg (ref 32.0–48.0)
TCO2: 27 mmol/L (ref 22–32)
pH, Arterial: 7.349 — ABNORMAL LOW (ref 7.350–7.450)
pO2, Arterial: 113 mmHg — ABNORMAL HIGH (ref 83.0–108.0)

## 2017-08-12 SURGERY — RIGHT/LEFT HEART CATH AND CORONARY ANGIOGRAPHY
Anesthesia: LOCAL

## 2017-08-12 MED ORDER — LIDOCAINE HCL 2 % IJ SOLN
INTRAMUSCULAR | Status: AC
  Filled 2017-08-12: qty 10

## 2017-08-12 MED ORDER — HEPARIN (PORCINE) IN NACL 2-0.9 UNIT/ML-% IJ SOLN
INTRAMUSCULAR | Status: AC
  Filled 2017-08-12: qty 1000

## 2017-08-12 MED ORDER — DIAZEPAM 5 MG PO TABS
10.0000 mg | ORAL_TABLET | ORAL | Status: AC
  Administered 2017-08-12: 10 mg via ORAL

## 2017-08-12 MED ORDER — HEPARIN (PORCINE) IN NACL 2-0.9 UNIT/ML-% IJ SOLN
INTRAMUSCULAR | Status: DC | PRN
  Administered 2017-08-12: 10 mL via INTRA_ARTERIAL

## 2017-08-12 MED ORDER — SODIUM CHLORIDE 0.9% FLUSH: 3.0000 mL | Freq: Two times a day (BID) | INTRAVENOUS | Status: DC

## 2017-08-12 MED ORDER — MIDAZOLAM HCL 2 MG/2ML IJ SOLN
INTRAMUSCULAR | Status: DC | PRN
  Administered 2017-08-12: 1 mg via INTRAVENOUS

## 2017-08-12 MED ORDER — DIAZEPAM 5 MG PO TABS
ORAL_TABLET | ORAL | Status: AC
  Administered 2017-08-12: 10 mg via ORAL
  Filled 2017-08-12: qty 2

## 2017-08-12 MED ORDER — IOPAMIDOL (ISOVUE-370) INJECTION 76%
INTRAVENOUS | Status: DC | PRN
  Administered 2017-08-12: 135 mL via INTRA_ARTERIAL

## 2017-08-12 MED ORDER — FENTANYL CITRATE (PF) 100 MCG/2ML IJ SOLN
INTRAMUSCULAR | Status: AC
  Filled 2017-08-12: qty 2

## 2017-08-12 MED ORDER — OXYCODONE HCL 5 MG PO TABS: 5.0000 mg | ORAL_TABLET | ORAL | Status: DC | PRN

## 2017-08-12 MED ORDER — HEPARIN SODIUM (PORCINE) 1000 UNIT/ML IJ SOLN
INTRAMUSCULAR | Status: DC | PRN
  Administered 2017-08-12: 4500 [IU] via INTRAVENOUS

## 2017-08-12 MED ORDER — MIDAZOLAM HCL 2 MG/2ML IJ SOLN
INTRAMUSCULAR | Status: AC
  Filled 2017-08-12: qty 2

## 2017-08-12 MED ORDER — ASPIRIN 81 MG PO CHEW: 81.0000 mg | CHEWABLE_TABLET | ORAL | Status: DC

## 2017-08-12 MED ORDER — SODIUM CHLORIDE 0.9% FLUSH: 3.0000 mL | INTRAVENOUS | Status: DC | PRN

## 2017-08-12 MED ORDER — SODIUM CHLORIDE 0.9 % IV SOLN: 250.0000 mL | INTRAVENOUS | Status: DC | PRN

## 2017-08-12 MED ORDER — METOPROLOL SUCCINATE ER 25 MG PO TB24: 12.5000 mg | ORAL_TABLET | Freq: Every day | ORAL | 11 refills | Status: DC

## 2017-08-12 MED ORDER — VERAPAMIL HCL 2.5 MG/ML IV SOLN
INTRAVENOUS | Status: AC
  Filled 2017-08-12: qty 2

## 2017-08-12 MED ORDER — ACETAMINOPHEN 325 MG PO TABS: 650.0000 mg | ORAL_TABLET | ORAL | Status: DC | PRN

## 2017-08-12 MED ORDER — HEPARIN SODIUM (PORCINE) 1000 UNIT/ML IJ SOLN
INTRAMUSCULAR | Status: AC
  Filled 2017-08-12: qty 2

## 2017-08-12 MED ORDER — FENTANYL CITRATE (PF) 100 MCG/2ML IJ SOLN
INTRAMUSCULAR | Status: DC | PRN
  Administered 2017-08-12: 50 ug via INTRAVENOUS

## 2017-08-12 MED ORDER — METOPROLOL SUCCINATE 12.5 MG HALF TABLET: 12.5000 mg | ORAL_TABLET | Freq: Every day | ORAL | Status: DC

## 2017-08-12 MED ORDER — LIDOCAINE HCL (PF) 1 % IJ SOLN
INTRAMUSCULAR | Status: DC | PRN
  Administered 2017-08-12 (×2): 2 mL via SUBCUTANEOUS

## 2017-08-12 MED ORDER — IOPAMIDOL (ISOVUE-370) INJECTION 76%
INTRAVENOUS | Status: AC
  Filled 2017-08-12: qty 100

## 2017-08-12 MED ORDER — SODIUM CHLORIDE 0.9 % IV SOLN
INTRAVENOUS | Status: DC
  Administered 2017-08-12: 11:00:00 via INTRAVENOUS

## 2017-08-12 MED ORDER — ONDANSETRON HCL 4 MG/2ML IJ SOLN: 4.0000 mg | Freq: Four times a day (QID) | INTRAMUSCULAR | Status: DC | PRN

## 2017-08-12 MED ORDER — SODIUM CHLORIDE 0.9 % IV SOLN: INTRAVENOUS | Status: DC

## 2017-08-12 MED ORDER — HEPARIN (PORCINE) IN NACL 2-0.9 UNIT/ML-% IJ SOLN
INTRAMUSCULAR | Status: AC | PRN
  Administered 2017-08-12: 1000 mL

## 2017-08-12 SURGICAL SUPPLY — 17 items
CATH BALLN WEDGE 5F 110CM (CATHETERS) ×1 IMPLANT
CATH EXPO 5F FL3.5 (CATHETERS) ×1 IMPLANT
CATH INFINITI JR4 5F (CATHETERS) ×1 IMPLANT
CATH LAUNCHER 5F RADR (CATHETERS) IMPLANT
CATHETER LAUNCHER 5F RADR (CATHETERS) ×2
COVER PRB 48X5XTLSCP FOLD TPE (BAG) IMPLANT
COVER PROBE 5X48 (BAG) ×2
DEVICE RAD COMP TR BAND LRG (VASCULAR PRODUCTS) ×1 IMPLANT
GLIDESHEATH SLEND A-KIT 6F 22G (SHEATH) ×1 IMPLANT
GUIDEWIRE INQWIRE 1.5J.035X260 (WIRE) IMPLANT
INQWIRE 1.5J .035X260CM (WIRE) ×2
KIT HEART LEFT (KITS) ×2 IMPLANT
PACK CARDIAC CATHETERIZATION (CUSTOM PROCEDURE TRAY) ×2 IMPLANT
SHEATH GLIDE SLENDER 4/5FR (SHEATH) ×1 IMPLANT
TRANSDUCER W/STOPCOCK (MISCELLANEOUS) ×2 IMPLANT
TUBING CIL FLEX 10 FLL-RA (TUBING) ×2 IMPLANT
WIRE EMERALD ST .035X150CM (WIRE) ×1 IMPLANT

## 2017-08-12 NOTE — Discharge Instructions (Signed)
New medication/// metoprolol 12.5 mg daily   Radial Site Care Refer to this sheet in the next few weeks. These instructions provide you with information about caring for yourself after your procedure. Your health care provider may also give you more specific instructions. Your treatment has been planned according to current medical practices, but problems sometimes occur. Call your health care provider if you have any problems or questions after your procedure. What can I expect after the procedure? After your procedure, it is typical to have the following:  Bruising at the radial site that usually fades within 1-2 weeks.  Blood collecting in the tissue (hematoma) that may be painful to the touch. It should usually decrease in size and tenderness within 1-2 weeks.  Follow these instructions at home:  Take medicines only as directed by your health care provider.  You may shower 24-48 hours after the procedure or as directed by your health care provider. Remove the bandage (dressing) and gently wash the site with plain soap and water. Pat the area dry with a clean towel. Do not rub the site, because this may cause bleeding.  Do not take baths, swim, or use a hot tub until your health care provider approves.  Check your insertion site every day for redness, swelling, or drainage.  Do not apply powder or lotion to the site.  Do not flex or bend the affected arm for 24 hours or as directed by your health care provider.  Do not push or pull heavy objects with the affected arm for 24 hours or as directed by your health care provider.  Do not lift over 10 lb (4.5 kg) for 5 days after your procedure or as directed by your health care provider.  Ask your health care provider when it is okay to: ? Return to work or school. ? Resume usual physical activities or sports. ? Resume sexual activity.  Do not drive home if you are discharged the same day as the procedure. Have someone else drive  you.  You may drive 24 hours after the procedure unless otherwise instructed by your health care provider.  Do not operate machinery or power tools for 24 hours after the procedure.  If your procedure was done as an outpatient procedure, which means that you went home the same day as your procedure, a responsible adult should be with you for the first 24 hours after you arrive home.  Keep all follow-up visits as directed by your health care provider. This is important. Contact a health care provider if:  You have a fever.  You have chills.  You have increased bleeding from the radial site. Hold pressure on the site. Get help right away if:  You have unusual pain at the radial site.  You have redness, warmth, or swelling at the radial site.  You have drainage (other than a small amount of blood on the dressing) from the radial site.  The radial site is bleeding, and the bleeding does not stop after 30 minutes of holding steady pressure on the site.  Your arm or hand becomes pale, cool, tingly, or numb. This information is not intended to replace advice given to you by your health care provider. Make sure you discuss any questions you have with your health care provider. Document Released: 12/11/2010 Document Revised: 04/15/2016 Document Reviewed: 05/27/2014 Elsevier Interactive Patient Education  2018 [place].  Moderate Conscious Sedation, Adult, Care After These instructions provide you with information about caring for yourself after  your procedure. Your health care provider may also give you more specific instructions. Your treatment has been planned according to current medical practices, but problems sometimes occur. Call your health care provider if you have any problems or questions after your procedure. What can I expect after the procedure? After your procedure, it is common:  To feel sleepy for several hours.  To feel clumsy and have poor balance for several  hours.  To have poor judgment for several hours.  To vomit if you eat too soon.  Follow these instructions at home: For at least 24 hours after the procedure:   Do not: ? Participate in activities where you could fall or become injured. ? Drive. ? Use heavy machinery. ? Drink alcohol. ? Take sleeping pills or medicines that cause drowsiness. ? Make important decisions or sign legal documents. ? Take care of children on your own.  Rest. Eating and drinking  Follow the diet recommended by your health care provider.  If you vomit: ? Drink water, juice, or soup when you can drink without vomiting. ? Make sure you have little or no nausea before eating solid foods. General instructions  Have a responsible adult stay with you until you are awake and alert.  Take over-the-counter and prescription medicines only as told by your health care provider.  If you smoke, do not smoke without supervision.  Keep all follow-up visits as told by your health care provider. This is important. Contact a health care provider if:  You keep feeling nauseous or you keep vomiting.  You feel light-headed.  You develop a rash.  You have a fever. Get help right away if:  You have trouble breathing. This information is not intended to replace advice given to you by your health care provider. Make sure you discuss any questions you have with your health care provider. Document Released: 08/29/2013 Document Revised: 04/12/2016 Document Reviewed: 02/28/2016 Elsevier Interactive Patient Education  [employer].

## 2017-08-12 NOTE — H&P (View-Only) (Signed)
CARDIOLOGY OFFICE NOTE  Date:  08/09/2017    Daniel Mendez Date of Birth: May 30, 1941 Medical Record #[id number]  PCP:  Seward Carol, MD  Cardiologist:  [name]    Chief Complaint  Patient presents with  . Coronary Artery Disease  . Aortic Stenosis    Follow up visit - seen for Dr. [name]    History of Present Illness: Daniel Mendez is a 76 y.o. male who presents today for a follow up visit. Seen for Dr. [name].   He has a history of PAD with prior left carotid endarterectomy, moderate aortic stenosis, suspected CAD based on past abnormal Cardiolite (asymptomatic) December 2014, essential hypertension, hypercalcemia and hyperlipidemia.   Last seen here back in November by Dr. [name] - some very minimal chest tightness noted with only heavy exertion - otherwise felt to be doing ok. Dr. [name] felt like he was doing ok.   Comes in today. Here alone. He had to miss his appointment due to speaking at a funeral. Says he is ok - has a cold - but moreso, has noticed a change since last here. Little more chest tightness that is easier to "bring on". Notes it more with less exertion such as being on the treadmill. More fatigue - sometimes just has "to stop" - this is not like him. Little more shortness of breath as well. No syncope noted.   Past Medical History:  Diagnosis Date  . Anginal pain ([place])   . Arthritis    hands  . Coronary artery disease   . Diabetes ([place])    type 2  . GERD (gastroesophageal reflux disease)   . Heart murmur    discovered in 2015 - followed by Dr. Daneen Schick  . History of hiatal hernia   . HTN (hypertension)   . Hypercalcemia   . Hyperglyceridemia   . Hyperlipidemia   . Kidney stone   . Mild aortic stenosis Oct 2014  . Peripheral vascular disease ([place])   . Shortness of breath dyspnea    on exertion  . Sleep apnea    no cpap    Past Surgical History:  Procedure Laterality Date  . COLONOSCOPY WITH PROPOFOL N/A 11/19/2014   Procedure:  COLONOSCOPY WITH PROPOFOL;  Surgeon: Garlan Fair, MD;  Location: [facility];  Service: Endoscopy;  Laterality: N/A;  . dental implants     dental post implanted to hold dentures/partials in place  . ENDARTERECTOMY Left 03/10/2015   Procedure: Left Carotid ENDARTERECTOMY ;  Surgeon: Rosetta Posner, MD;  Location: [facility];  Service: Vascular;  Laterality: Left;  . FRACTURE SURGERY  1958   Left hand   . HERNIA REPAIR     Bilateral inguinal   . hernia repair b/l inguinal    . knee arthroscopy left    . ROTATOR CUFF REPAIR    . VASCULAR SURGERY    . VASECTOMY       Medications: Current Meds  Medication Sig  . acetaminophen (TYLENOL) 500 MG tablet Take 500 mg by mouth daily as needed (pain). Reported on 03/30/2016  . amLODipine (NORVASC) 10 MG tablet Take 10 mg by mouth daily.  [name] Kitchen aspirin EC 81 MG tablet Take 81 mg by mouth at bedtime.  [name] Kitchen atorvastatin (LIPITOR) 20 MG tablet Take 20 mg by mouth at bedtime.   . chlorhexidine (PERIDEX) 0.12 % solution Use as directed 15 mLs in the mouth or throat 2 (two) times daily as needed (mouth sores). Swish and spit  .  cyclobenzaprine (FLEXERIL) 5 MG tablet Take 2.5 mg by mouth daily as needed for muscle spasms.   [name] Kitchen glimepiride (AMARYL) 4 MG tablet Take 4 mg by mouth daily with breakfast.  . metFORMIN (GLUCOPHAGE) 500 MG tablet Take 500 mg by mouth 4 (four) times daily.   . Omega-3 Fatty Acids (FISH OIL) 1000 MG CAPS Take 2,000 mg by mouth 2 (two) times daily.   [name] Kitchen omeprazole (PRILOSEC) 20 MG capsule Take 20 mg by mouth every other day.   . ONE TOUCH ULTRA TEST test strip   . ONETOUCH DELICA LANCETS 16L MISC   . sildenafil (REVATIO) 20 MG tablet Take 60-100 mg by mouth daily as needed (erectile  dysfunction).   . sitaGLIPtin (JANUVIA) 100 MG tablet Take 100 mg by mouth daily with breakfast.  . spironolactone (ALDACTONE) 25 MG tablet Take 0.5 tablets (12.5 mg total) by mouth daily.  [name] Kitchen telmisartan (MICARDIS) 80 MG tablet Take 80 mg by mouth at bedtime.        Allergies: No Known Allergies  Social History: The patient  reports that he has never smoked. He has never used smokeless tobacco. He reports that he drinks alcohol. He reports that he does not use drugs.   Family History: The patient's family history includes CVA in his mother; Cancer in his daughter; Diabetes in his brother; Heart disease in his brother and father; Peripheral vascular disease in his mother.   Review of Systems: Please see the history of present illness.   Otherwise, the review of systems is positive for none.   All other systems are reviewed and negative.   Physical Exam: VS:  BP (!) 142/76   Pulse 82   Ht 5\' 10"  (1.778 m)   Wt 184 lb 1.9 oz (83.5 kg)   BMI 26.42 kg/m  .  BMI Body mass index is 26.42 kg/m.  Wt Readings from Last 3 Encounters:  08/09/17 184 lb 1.9 oz (83.5 kg)  04/29/17 186 lb (84.4 kg)  10/20/16 185 lb 12.8 oz (84.3 kg)    General: Pleasant. Well developed, well nourished and in no acute distress.   HEENT: Normal.  Neck: Supple, no JVD, carotid bruits, or masses noted.  Cardiac: Regular rate and rhythm. Harsh outflow murmur noted.  No edema.  Respiratory:  Lungs are clear to auscultation bilaterally with normal work of breathing.  GI: Soft and nontender.  MS: No deformity or atrophy. Gait and ROM intact.  Skin: Warm and dry. Color is normal.  Neuro:  Strength and sensation are intact and no gross focal deficits noted.  Psych: Alert, appropriate and with normal affect.   LABORATORY DATA:  EKG:  EKG is ordered today. This shows sinus brady with nonspecific T wave changes - unchanged - reviewed with Dr. [name] here in the office.   Lab Results  Component Value Date   WBC 7.7 03/11/2015   HGB 11.7 (L) 03/11/2015   HCT 36.0 (L) 03/11/2015   PLT 152 03/11/2015   GLUCOSE 150 (H) 09/03/2015   ALT 53 03/06/2015   AST 38 (H) 03/06/2015   NA 137 09/03/2015   K 4.4 09/03/2015   CL 102 09/03/2015   CREATININE 1.13 09/03/2015   BUN  20 09/03/2015   CO2 25 09/03/2015   INR 0.98 03/06/2015       BNP (last 3 results) No results for input(s): BNP in the last 8760 hours.  ProBNP (last 3 results) No results for input(s): PROBNP in the last 8760 hours.   Other  Studies Reviewed Today:  Echocardiogram September 2017:  Study Conclusions  - Left ventricle: The cavity size was normal. Wall thickness was increased in a pattern of mild LVH. Systolic function was vigorous. The estimated ejection fraction was in the range of 65% to 70%. Doppler parameters are consistent with abnormal left ventricular relaxation (grade 1 diastolic dysfunction). - Aortic valve: AV is thickened, calcified. Difficult to see leaflets well. Peak and mean gradients through the valve are 32 and 20 mm Hg respectively consistent with mild to moderate AS. No significant change in mean gradient from exam of 2016. - Mitral valve: Calcified annulus. Mildly thickened, mildly calcified leaflets . - Left atrium: The atrium was moderately dilated. - Right ventricle: The cavity size was mildly dilated. - Right atrium: The atrium was mildly dilated.  Assessment/Plan:  1. Presumed CAD - has multiple cardiac risk factors (gender/PAD/DM/HTN/HLD) now endorsing more chest tightness with exertion - noted prior abnormal Myoview from December of 2014 - discussed with Dr. [name] here in the office this AM - recommended to proceed on with cardiac catheterization - left and right study. The patient understands that risks include but are not limited to stroke (1 in 1000), death (1 in 33), kidney failure [usually temporary] (1 in 500), bleeding (1 in 200), allergic reaction [possibly serious] (1 in 200), and agrees to proceed.   Arranged for this Friday with Dr. [name]. Will hold Metformin. Lab today.   2. Moderate AS - having more angina, little more dyspnea - no syncope - will arrange for repeat echo. Left and right heart cath later this week.    3. HTN - repeat by me is ok at 130/70.   4. PAD - followed by [facility]  5. HLD - recent labs noted. LDL is 40  6. DM - last A1c noted.   Current medicines are reviewed with the patient today.  The patient does not have concerns regarding medicines other than what has been noted above.  The following changes have been made:  See above.  Labs/ tests ordered today include:    Orders Placed This Encounter  Procedures  . Basic metabolic panel  . CBC  . Protime-INR  . APTT  . EKG 12-Lead  . ECHOCARDIOGRAM COMPLETE     Disposition:   Further disposition pending above results.   Patient is agreeable to this plan and will call if any problems develop in the interim.   SignedTruitt Merle, NP  08/09/2017 9:30 AM  [facility] [address] [place], [place]  [address] Phone: [phone] Fax: [phone]

## 2017-08-12 NOTE — Progress Notes (Signed)
Site area: right brachial vein sheath Site Prior to Removal:  Level 0 Pressure Applied For:10 min Manual: yes   Patient Status During Pull:  vss Post Pull Site:  Level 0 Post Pull Instructions Given:  yes Post Pull Pulses Present: yes Dressing Applied:  Tegaderm/  Bedrest begins @ na Comments:

## 2017-08-12 NOTE — Interval H&P Note (Signed)
Cath Lab Visit (complete for each Cath Lab visit)  Clinical Evaluation Leading to the Procedure:   ACS: No.  Non-ACS:    Anginal Classification: CCS III  Anti-ischemic medical therapy: Minimal Therapy (1 class of medications)  Non-Invasive Test Results: No non-invasive testing performed  Prior CABG: Previous CABG      History and Physical Interval Note:  08/12/2017 12:57 PM  Daniel Mendez  has presented today for surgery, with the diagnosis of cp  The various methods of treatment have been discussed with the patient and family. After consideration of risks, benefits and other options for treatment, the patient has consented to  Procedure(s): RIGHT/LEFT HEART CATH AND CORONARY ANGIOGRAPHY (N/A) as a surgical intervention .  The patient's history has been reviewed, patient examined, no change in status, stable for surgery.  I have reviewed the patient's chart and labs.  Questions were answered to the patient's satisfaction.     Belva Crome III

## 2017-08-15 ENCOUNTER — Encounter (HOSPITAL_COMMUNITY): Payer: Self-pay | Admitting: Interventional Cardiology

## 2017-08-17 ENCOUNTER — Encounter: Payer: Self-pay | Admitting: *Deleted

## 2017-08-17 ENCOUNTER — Telehealth: Payer: Self-pay | Admitting: Interventional Cardiology

## 2017-08-17 NOTE — Telephone Encounter (Signed)
°  New Message   pt verbalized that he is calling for rn  He need a return back to work note

## 2017-08-17 NOTE — Telephone Encounter (Signed)
I spoke with pt. He had cath on 08/12/17. Reports cath site is healing fine. He feels good.  He drives a delivery truck part time.  Occasionally has to lift up to 20 lbs. He would like to pick note up when ready.

## 2017-08-17 NOTE — Telephone Encounter (Signed)
S/w pt is aware leaving work note at front desk and went over restrictions.  Pt Also aware appointment was made with Dr. [name] on 11/1 for post cath visit.

## 2017-09-22 ENCOUNTER — Ambulatory Visit (INDEPENDENT_AMBULATORY_CARE_PROVIDER_SITE_OTHER): Payer: Medicare Other | Admitting: Interventional Cardiology

## 2017-09-22 ENCOUNTER — Encounter: Payer: Self-pay | Admitting: Interventional Cardiology

## 2017-09-22 VITALS — BP 128/74 | HR 78 | Ht 70.0 in | Wt 186.4 lb

## 2017-09-22 DIAGNOSIS — E785 Hyperlipidemia, unspecified: Secondary | ICD-10-CM | POA: Diagnosis not present

## 2017-09-22 DIAGNOSIS — I10 Essential (primary) hypertension: Secondary | ICD-10-CM

## 2017-09-22 DIAGNOSIS — I6522 Occlusion and stenosis of left carotid artery: Secondary | ICD-10-CM

## 2017-09-22 DIAGNOSIS — I25119 Atherosclerotic heart disease of native coronary artery with unspecified angina pectoris: Secondary | ICD-10-CM | POA: Diagnosis not present

## 2017-09-22 DIAGNOSIS — E118 Type 2 diabetes mellitus with unspecified complications: Secondary | ICD-10-CM

## 2017-09-22 DIAGNOSIS — I350 Nonrheumatic aortic (valve) stenosis: Secondary | ICD-10-CM

## 2017-09-22 MED ORDER — ISOSORBIDE MONONITRATE ER 30 MG PO TB24: 30.0000 mg | ORAL_TABLET | Freq: Every day | ORAL | 3 refills | Status: DC

## 2017-09-22 MED ORDER — RANOLAZINE ER 500 MG PO TB12: 500.0000 mg | ORAL_TABLET | Freq: Two times a day (BID) | ORAL | 3 refills | Status: DC

## 2017-09-22 NOTE — Patient Instructions (Addendum)
Medication Instructions:  1) START Ranexa 500mg - one tablet twice daily  Labwork: None  Testing/Procedures: None  Follow-Up: Your physician recommends that you schedule a follow-up appointment in: 4-6 weeks with Dr. [name]. (Can have 12/7 at end of clinic)   Any Other Special Instructions Will Be Listed Below (If Applicable).     If you need a refill on your cardiac medications before your next appointment, please call your pharmacy.

## 2017-09-22 NOTE — Progress Notes (Signed)
Cardiology Office Note    Date:  09/22/2017   ID:  Daniel Mendez, DOB [birth year], MRN [id number]  PCP:  Daniel Carol, MD  Cardiologist: Mendez Grooms, MD   Chief Complaint  Patient presents with  . Coronary Artery Disease  . Cardiac Valve Problem    History of Present Illness:  Daniel Mendez is a 76 y.o. male for follow-up of prior left carotid endarterectomy, moderate aortic stenosis, essential hypertension, hyperlipidemia, hypercalcemia, and recent catheterization documented total occlusion of the right coronary after developing progressive exertional angina.  He returns today after an attempt at medication adjustment to control angina in the setting of newly diagnosed chronic total occlusion of the right coronary.  He has not been able to tolerate any significant dose of metoprolol because of bradycardia and fatigue.  We discussed using long-acting nitrates but he prefers to be able to continue sildenafil therapy.  He continues to have angina and is altering his quality of life.  He is not needed to use any nitroglycerin.   Past Medical History:  Diagnosis Date  . Anginal pain ([place])   . Arthritis    hands  . Coronary artery disease   . Diabetes ([place])    type 2  . GERD (gastroesophageal reflux disease)   . Heart murmur    discovered in 2015 - followed by Dr. Daneen Daniel  . History of hiatal hernia   . HTN (hypertension)   . Hypercalcemia   . Hyperglyceridemia   . Hyperlipidemia   . Kidney stone   . Mild aortic stenosis Oct 2014  . Peripheral vascular disease ([place])   . Shortness of breath dyspnea    on exertion  . Sleep apnea    no cpap    Past Surgical History:  Procedure Laterality Date  . COLONOSCOPY WITH PROPOFOL N/A 11/19/2014   Procedure: COLONOSCOPY WITH PROPOFOL;  Surgeon: Mendez Fair, MD;  Location: [facility];  Service: Endoscopy;  Laterality: N/A;  . dental implants     dental post implanted to hold dentures/partials in place  .  ENDARTERECTOMY Left 03/10/2015   Procedure: Left Carotid ENDARTERECTOMY ;  Surgeon: Mendez Posner, MD;  Location: [facility];  Service: Vascular;  Laterality: Left;  . FRACTURE SURGERY  1958   Left hand   . HERNIA REPAIR     Bilateral inguinal   . hernia repair b/l inguinal    . knee arthroscopy left    . RIGHT/LEFT HEART CATH AND CORONARY ANGIOGRAPHY N/A 08/12/2017   Procedure: RIGHT/LEFT HEART CATH AND CORONARY ANGIOGRAPHY;  Surgeon: Mendez Crome, MD;  Location: [facility];  Service: Cardiovascular;  Laterality: N/A;  . ROTATOR CUFF REPAIR    . VASCULAR SURGERY    . VASECTOMY      Current Medications: Outpatient Medications Prior to Visit  Medication Sig Dispense Refill  . acetaminophen (TYLENOL) 500 MG tablet Take 500 mg by mouth daily as needed (pain). Reported on 03/30/2016    . amLODipine (NORVASC) 10 MG tablet Take 10 mg by mouth daily.    [name] Kitchen aspirin EC 81 MG tablet Take 81 mg by mouth at bedtime.    [name] Kitchen atorvastatin (LIPITOR) 20 MG tablet Take 20 mg by mouth at bedtime.     . cyclobenzaprine (FLEXERIL) 5 MG tablet Take 2.5 mg by mouth daily as needed for muscle spasms.     [name] Kitchen glimepiride (AMARYL) 4 MG tablet Take 4 mg by mouth daily with breakfast.    .  metFORMIN (GLUCOPHAGE) 500 MG tablet Take 500 mg by mouth 4 (four) times daily.     . metoprolol succinate (TOPROL XL) 25 MG 24 hr tablet Take 0.5 tablets (12.5 mg total) by mouth daily. 30 tablet 11  . Omega-3 Fatty Acids (FISH OIL) 1000 MG CAPS Take 2,000 mg by mouth 2 (two) times daily.     [name] Kitchen omeprazole (PRILOSEC) 20 MG capsule Take 20 mg by mouth every other day.     . ONE TOUCH ULTRA TEST test strip 1 each by Other route daily.     Mendez Daniel DELICA LANCETS 62I MISC 1 each by Other route daily.     . sildenafil (REVATIO) 20 MG tablet Take 60-100 mg by mouth daily as needed (erectile  dysfunction).     . sitaGLIPtin (JANUVIA) 100 MG tablet Take 100 mg by mouth daily with breakfast.    . spironolactone (ALDACTONE) 25 MG tablet  Take 0.5 tablets (12.5 mg total) by mouth daily. 15 tablet 10  . telmisartan (MICARDIS) 80 MG tablet Take 80 mg by mouth at bedtime.      No facility-administered medications prior to visit.      Allergies:   Patient has no known allergies.   Social History   Social History  . Marital status: Married    Spouse name: N/A  . Number of children: N/A  . Years of education: N/A   Social History Main Topics  . Smoking status: Never Smoker  . Smokeless tobacco: Never Used  . Alcohol use 0.0 oz/week     Comment: rarely  . Drug use: No  . Sexual activity: Yes    Birth control/ protection: None   Other Topics Concern  . None   Social History Narrative  . None     Family History:  The patient's family history includes CVA in his mother; Cancer in his daughter; Diabetes in his brother; Heart disease in his brother and father; Peripheral vascular disease in his mother.   ROS:   Please see the history of present illness.    Has occasional dizziness, shortness of breath, and still having chest discomfort with activity although slightly improved. All other systems reviewed and are negative.   PHYSICAL EXAM:   VS:  BP 128/74 (BP Location: Left Arm)   Pulse 78   Ht 5\' 10"  (1.778 m)   Wt 186 lb 6.4 oz (84.6 kg)   BMI 26.75 kg/m    GEN: Well nourished, well developed, in no acute distress  HEENT: normal  Neck: no JVD, carotid bruits, or masses Cardiac: bradycardia with I RR; rubs, or gallops.  He has 3/6 crescendo decrescendo aortic stenosis murmur. No edema. Respiratory:  clear to auscultation bilaterally, normal work of breathing GI: soft, nontender, nondistended, + BS MS: no deformity or atrophy  Skin: warm and dry, no rash Neuro:  Alert and Oriented x 3, Strength and sensation are intact Psych: euthymic mood, full affect  Wt Readings from Last 3 Encounters:  09/22/17 186 lb 6.4 oz (84.6 kg)  08/12/17 180 lb (81.6 kg)  08/09/17 184 lb 1.9 oz (83.5 kg)      Studies/Labs  Reviewed:   EKG:  EKG  Not done  Recent Labs: 08/09/2017: BUN 15; Creatinine, Ser 1.25; Hemoglobin 12.9; Platelets 181; Potassium 4.9; Sodium 136   Lipid Panel No results found for: CHOL, TRIG, HDL, CHOLHDL, VLDL, LDLCALC, LDLDIRECT  Additional studies/ records that were reviewed today include:  Cardiac catheterization performed 08/12/2017: Coronary Diagrams  Diagnostic Diagram  RECOMMENDATIONS:  Adjust medications as outpatient to help alleviate exertional angina. Long-acting nitrates and beta blocker therapy could be helpful. Any therapy should be used with caution given calcific aortic stenosis that will be progressing over time.  If angina is limiting and uncontrolled by medical therapy, may consider CTO referral.    ASSESSMENT:    1. Coronary artery disease involving native coronary artery of native heart with angina pectoris ([place])   2. Aortic stenosis, moderate   3. Essential hypertension, uncontrolled   4. Left carotid artery stenosis   5. Type 2 diabetes mellitus with complication, without long-term current use of insulin ([place])   6. Dyslipidemia      PLAN:  In order of problems listed above:  1. Class II angina pectoris.  Quality of life altering.  Cannot use higher dose beta-blocker therapy because of significant bradycardia and symptoms.  Long-acting nitrates were going to be tried initially however he is dependent upon sildenafil for erectile function.  We will start Ranexa 500 mg twice daily.  Clinical follow-up in 6 weeks.  If angina severity does not significantly improve, will discuss CTO PCI with colleagues. 2. Moderate by echo and cath data.  Continue to follow. 3. Blood pressure control is adequate. 4. Not addressed 5. LDL target is less than 70.  Increase physical activity.  Titrate Glumetza as needed for angina control.  If unable to control will refer for consideration of PCI of CTO.  4-week follow-up.   Medication Adjustments/Labs and Tests  Ordered: Current medicines are reviewed at length with the patient today.  Concerns regarding medicines are outlined above.  Medication changes, Labs and Tests ordered today are listed in the Patient Instructions below. Patient Instructions  Medication Instructions:  1) START Isosorbide 30mg - take 1/2 tablet (15mg ) once daily for a week and then increase to a whole tablet (30mg ) one daily.  Labwork: None  Testing/Procedures: None  Follow-Up: Your physician recommends that you schedule a follow-up appointment in: 4-6 weeks with Dr. [name]. (Can have    Any Other Special Instructions Will Be Listed Below (If Applicable).     If you need a refill on your cardiac medications before your next appointment, please call your pharmacy.      Signed, Mendez Grooms, MD  09/22/2017 10:09 AM    [facility] [address], [place], [place]  [address] Phone: [phone]; Fax: [phone]

## 2017-09-29 ENCOUNTER — Other Ambulatory Visit: Payer: Self-pay | Admitting: Interventional Cardiology

## 2017-09-29 DIAGNOSIS — I10 Essential (primary) hypertension: Secondary | ICD-10-CM

## 2017-10-27 NOTE — Progress Notes (Signed)
Cardiology Office Note    Date:  10/28/2017   ID:  Daniel Mendez, DOB January 10, 1941, MRN [id number]  PCP:  Seward Carol, MD  Cardiologist: Sinclair Grooms, MD   Chief Complaint  Patient presents with  . Coronary Artery Disease    History of Present Illness:  Daniel Mendez is a 76 y.o. male for recent left carotid endarterectomy, moderate aortic stenosis, suspected CAD based on abnormal Cardiolite (asymptomatic), essential hypertension, and hyperlipidemia. Also has hypercalcemia.  Having angina.  Only moderate impairment of quality of life.  Within a minute resting resolves.  Not using sublingual nitroglycerin.  We did not use isosorbide because he is on a PDE 5 inhibitor.  There has been some improvement in angina on Ranexa.  He is on suboptimal dosing.  Recently diagnosed chronic total occlusion of the right coronary with collaterals.   Past Medical History:  Diagnosis Date  . Anginal pain ([place])   . Arthritis    hands  . Coronary artery disease   . Diabetes ([place])    type 2  . GERD (gastroesophageal reflux disease)   . Heart murmur    discovered in 2015 - followed by Dr. Daneen Schick  . History of hiatal hernia   . HTN (hypertension)   . Hypercalcemia   . Hyperglyceridemia   . Hyperlipidemia   . Kidney stone   . Mild aortic stenosis Oct 2014  . Peripheral vascular disease ([place])   . Shortness of breath dyspnea    on exertion  . Sleep apnea    no cpap    Past Surgical History:  Procedure Laterality Date  . COLONOSCOPY WITH PROPOFOL N/A 11/19/2014   Procedure: COLONOSCOPY WITH PROPOFOL;  Surgeon: Garlan Fair, MD;  Location: [facility];  Service: Endoscopy;  Laterality: N/A;  . dental implants     dental post implanted to hold dentures/partials in place  . ENDARTERECTOMY Left 03/10/2015   Procedure: Left Carotid ENDARTERECTOMY ;  Surgeon: Rosetta Posner, MD;  Location: [facility];  Service: Vascular;  Laterality: Left;  . FRACTURE SURGERY  1958   Left hand    . HERNIA REPAIR     Bilateral inguinal   . hernia repair b/l inguinal    . knee arthroscopy left    . RIGHT/LEFT HEART CATH AND CORONARY ANGIOGRAPHY N/A 08/12/2017   Procedure: RIGHT/LEFT HEART CATH AND CORONARY ANGIOGRAPHY;  Surgeon: Belva Crome, MD;  Location: [facility];  Service: Cardiovascular;  Laterality: N/A;  . ROTATOR CUFF REPAIR    . VASCULAR SURGERY    . VASECTOMY      Current Medications: Outpatient Medications Prior to Visit  Medication Sig Dispense Refill  . acetaminophen (TYLENOL) 500 MG tablet Take 500 mg by mouth daily as needed (pain). Reported on 03/30/2016    . amLODipine (NORVASC) 10 MG tablet Take 10 mg by mouth daily.    [name] Kitchen aspirin EC 81 MG tablet Take 81 mg by mouth at bedtime.    [name] Kitchen atorvastatin (LIPITOR) 20 MG tablet Take 20 mg by mouth at bedtime.     . cyclobenzaprine (FLEXERIL) 5 MG tablet Take 2.5 mg by mouth daily as needed for muscle spasms.     [name] Kitchen glimepiride (AMARYL) 4 MG tablet Take 4 mg by mouth daily with breakfast.    . metFORMIN (GLUCOPHAGE) 500 MG tablet Take 500 mg by mouth 4 (four) times daily.     . metoprolol succinate (TOPROL XL) 25 MG 24 hr tablet Take  0.5 tablets (12.5 mg total) by mouth daily. 30 tablet 11  . Omega-3 Fatty Acids (FISH OIL) 1000 MG CAPS Take 2,000 mg by mouth 2 (two) times daily.     [name] Kitchen omeprazole (PRILOSEC) 20 MG capsule Take 20 mg by mouth every other day.     . ONE TOUCH ULTRA TEST test strip 1 each by Other route daily.     Glory Rosebush DELICA LANCETS 71I MISC 1 each by Other route daily.     [name] Kitchen PRESCRIPTION MEDICATION TESTOSTERONE INJECTION  Into the muscle every two (2) weeks.    . ranolazine (RANEXA) 500 MG 12 hr tablet Take 1 tablet (500 mg total) by mouth 2 (two) times daily. 180 tablet 3  . sildenafil (REVATIO) 20 MG tablet Take 60-100 mg by mouth daily as needed (erectile  dysfunction).     . sitaGLIPtin (JANUVIA) 100 MG tablet Take 100 mg by mouth daily with breakfast.    . spironolactone (ALDACTONE) 25 MG  tablet TAKE 1/2 TABLET BY MOUTH EVERY DAY. 15 tablet 11  . telmisartan (MICARDIS) 80 MG tablet Take 80 mg by mouth at bedtime.      No facility-administered medications prior to visit.      Allergies:   Patient has no known allergies.   Social History   Socioeconomic History  . Marital status: Married    Spouse name: None  . Number of children: None  . Years of education: None  . Highest education level: None  Social Needs  . Financial resource strain: None  . Food insecurity - worry: None  . Food insecurity - inability: None  . Transportation needs - medical: None  . Transportation needs - non-medical: None  Occupational History  . None  Tobacco Use  . Smoking status: Never Smoker  . Smokeless tobacco: Never Used  Substance and Sexual Activity  . Alcohol use: Yes    Alcohol/week: 0.0 oz    Comment: rarely  . Drug use: No  . Sexual activity: Yes    Birth control/protection: None  Other Topics Concern  . None  Social History Narrative  . None     Family History:  The patient's family history includes CVA in his mother; Cancer in his daughter; Diabetes in his brother; Heart disease in his brother and father; Peripheral vascular disease in his mother.   ROS:   Please see the history of present illness.    He chose against long-acting nitrate therapy because he prefers to continue taking sildenafil.  Hearing loss.  Mild shortness of breath with activity. All other systems reviewed and are negative.   PHYSICAL EXAM:   VS:  BP (!) 102/56   Pulse (!) 47   Ht 5\' 10"  (1.778 m)   Wt 183 lb 9.6 oz (83.3 kg)   BMI 26.34 kg/m    GEN: Well nourished, well developed, in no acute distress  HEENT: normal  Neck: no JVD, carotid bruits, or masses Cardiac: RRR; 3/6 crescendo decrescendo systolic murmur of aortic stenosis.  No rubs, or gallops,no edema  Respiratory:  clear to auscultation bilaterally, normal work of breathing GI: soft, nontender, nondistended, + BS MS: no  deformity or atrophy  Skin: warm and dry, no rash Neuro:  Alert and Oriented x 3, Strength and sensation are intact Psych: euthymic mood, full affect  Wt Readings from Last 3 Encounters:  10/28/17 183 lb 9.6 oz (83.3 kg)  09/22/17 186 lb 6.4 oz (84.6 kg)  08/12/17 180 lb (81.6 kg)  Studies/Labs Reviewed:   EKG:  EKG not repeated  Recent Labs: 08/09/2017: BUN 15; Creatinine, Ser 1.25; Hemoglobin 12.9; Platelets 181; Potassium 4.9; Sodium 136   Lipid Panel No results found for: CHOL, TRIG, HDL, CHOLHDL, VLDL, LDLCALC, LDLDIRECT  Additional studies/ records that were reviewed today include:  2D Doppler echocardiogram 2018:  Study Conclusions   - Left ventricle: The cavity size was normal. There was moderate   concentric hypertrophy. Systolic function was vigorous. The   estimated ejection fraction was in the range of 65% to 70%. Wall   motion was normal; there were no regional wall motion   abnormalities. Doppler parameters are consistent with abnormal   left ventricular relaxation (grade 1 diastolic dysfunction).   There was no evidence of elevated ventricular filling pressure by   Doppler parameters. - Aortic valve: Valve mobility was restricted. There was moderate   stenosis. Valve area (VTI): 1.33 cm^2. Valve area (Vmax): 1.07   cm^2. Valve area (Vmean): 1.18 cm^2. - Mitral valve: There was trivial regurgitation. - Left atrium: The atrium was mildly dilated. - Right ventricle: Systolic function was normal. - Tricuspid valve: There was trivial regurgitation. - Pulmonary arteries: Systolic pressure was within the normal   range. - Inferior vena cava: The vessel was normal in size. - Pericardium, extracardiac: There was no pericardial effusion.   Impressions:   - Since the last study from 08/02/2016 transaortic gradients have   increased from 32/20 mmHg to 46/26 mmHg, aortic stenosis is   moderate.     -------------------------------------------------------------------    ASSESSMENT:    1. Aortic stenosis, moderate   2. Coronary artery disease involving native coronary artery of native heart with angina pectoris ([place])   3. Essential hypertension, uncontrolled   4. Dyslipidemia      PLAN:  In order of problems listed above:  1. Aortic valve is stable.  Needs chronic follow-up.  We will see the patient in 6 months. 2. We discussed the impact of the cold weather on chronic ischemic heart disease.  Instructed to use nitroglycerin as needed.  Stop rest to allow chest discomfort to resolve.  Will increase Ranexa to 1000 mg twice daily. 3. Blood pressure 118/70 mmHg. 4. LDL target less than 70.  Clinical follow-up in 4-6 months.  Call if increasing angina, dyspnea, or syncope.    Medication Adjustments/Labs and Tests Ordered: Current medicines are reviewed at length with the patient today.  Concerns regarding medicines are outlined above.  Medication changes, Labs and Tests ordered today are listed in the Patient Instructions below. There are no Patient Instructions on file for this visit.   Signed, Sinclair Grooms, MD  10/28/2017 10:20 AM    [facility] [address], [place], [place]  [address] Phone: [phone]; Fax: [phone]

## 2017-10-28 ENCOUNTER — Telehealth: Payer: Self-pay | Admitting: *Deleted

## 2017-10-28 ENCOUNTER — Encounter: Payer: Self-pay | Admitting: Interventional Cardiology

## 2017-10-28 ENCOUNTER — Ambulatory Visit: Payer: Medicare Other | Admitting: Interventional Cardiology

## 2017-10-28 VITALS — BP 102/56 | HR 47 | Ht 70.0 in | Wt 183.6 lb

## 2017-10-28 DIAGNOSIS — E785 Hyperlipidemia, unspecified: Secondary | ICD-10-CM

## 2017-10-28 DIAGNOSIS — I25119 Atherosclerotic heart disease of native coronary artery with unspecified angina pectoris: Secondary | ICD-10-CM | POA: Diagnosis not present

## 2017-10-28 DIAGNOSIS — I350 Nonrheumatic aortic (valve) stenosis: Secondary | ICD-10-CM

## 2017-10-28 DIAGNOSIS — I10 Essential (primary) hypertension: Secondary | ICD-10-CM

## 2017-10-28 MED ORDER — RANOLAZINE ER 1000 MG PO TB12
1000.0000 mg | ORAL_TABLET | Freq: Two times a day (BID) | ORAL | 3 refills | Status: DC
Start: 2017-10-28 — End: 2018-09-23

## 2017-10-28 NOTE — Patient Instructions (Signed)
Medication Instructions:  1) INCREASE RANEXA to 1000mg  TWICE DAILY  Labwork: None  Testing/Procedures: None  Follow-Up: Your provider wants you to follow-up in: 4-6 months with Dr. [name]. You will receive a reminder letter in the mail two months in advance. If you don't receive a letter, please call our office to schedule the follow-up appointment.    Any Other Special Instructions Will Be Listed Below (If Applicable).     If you need a refill on your cardiac medications before your next appointment, please call your pharmacy.

## 2017-10-28 NOTE — Telephone Encounter (Signed)
Patient was seen in the office today and Dr [name] increased his ranexa to 1000 mg bid. He went to the pharmacy to pick it up but they did not have any in stock. He will come back to the office to pick up two weeks of samples and he has been made aware that the office closes today at 11:30. Patient verbalized his understanding and appreciation.

## 2018-03-23 ENCOUNTER — Telehealth: Payer: Self-pay | Admitting: Interventional Cardiology

## 2018-03-23 NOTE — Telephone Encounter (Signed)
Pt c/o BP issue: STAT if pt c/o blurred vision, one-sided weakness or slurred speech  1. What are your last 5 BP readings? 78/46 today  2. Are you having any other symptoms (ex. Dizziness, headache, blurred vision, passed out)? Dizziness,Blurred vision  3. What is your BP issue? BP is low due to pt taking 3 Sidenafil

## 2018-03-23 NOTE — Telephone Encounter (Signed)
Call transferred into triage for low BP 3:30 pm today 78/46 Pt lightheaded, especially when standing and dizzy with blurry vision.  Advised him to lie down, elevated his legs, have a glass of water, change positions slowly On phone 97/42, HR 69 currently, vision returning to normal.  He has taken Ranexa and spironolactone this morning. 11:30 am took 60 mg of sildenafil.  Symptoms started around 2:30 pm.  Normally takes amlodipine at noon but did not take today. Advised hold amlodipine today and plan to hold Toprol XL tonight unless BP later is at least 120/.  (pt reports he has been working with pharmacist at PCP office and he recently decreased amlodipine to half pill)  Advised to call back to on call person later if further concerns.  Advised if BP falls again and/or he is more symptomatic he should call EMS for eval, possible ER treatment. Pt verbalizes understanding and appreciation for information provided.

## 2018-03-24 NOTE — Telephone Encounter (Signed)
Left message to call back  

## 2018-03-24 NOTE — Telephone Encounter (Signed)
Determine if he has NTG and if he took any? Agree with reommendations given yesterday.

## 2018-03-29 NOTE — Telephone Encounter (Signed)
Left message to call back  

## 2018-03-30 NOTE — Telephone Encounter (Signed)
Spoke with pt and he states that he has not taken any Nitro.  Since original call BP has been around 105/55.  Pt's weight normally 180-183.  Pt has purposely worked on losing weight and over the last 2 months has dropped down to 170lbs.   Denies any swelling, CP, dizziness.  Pt did go to [place] yesterday and was walking around and suddenly had "spots" in his eyes.  Unable to check pressure at that time.  Rested and sx resolved quickly.  Pt currently taking: Amlodipine 5mg  at lunch Spironolactone 12.5mg  in AM Telmisartan 80mg  at lunch Toprol XL 12.5mg  at HS  Advised I would send message to Dr. [name] for review and advisement.

## 2018-03-31 NOTE — Telephone Encounter (Signed)
Med list suggests amlodipine 10 mg daily. Have him hold if 5 mg daily or decrease to 5 mg daily if on 10 mg. Report BP's after change

## 2018-03-31 NOTE — Telephone Encounter (Signed)
Pt confirmed he is only taking Amlodipine 5mg  QD.  Advised pt of recommendations per Dr. [name].  Pt verbalized understanding and was in agreement with this plan.

## 2018-03-31 NOTE — Telephone Encounter (Signed)
Left message to call back  

## 2018-04-20 NOTE — Progress Notes (Signed)
Cardiology Office Note    Date:  04/21/2018   ID:  Daniel Mendez, DOB 1940/12/11, MRN [id number]  PCP:  Seward Carol, MD  Cardiologist: Sinclair Grooms, MD   Chief Complaint  Patient presents with  . Coronary Artery Disease  . Cardiac Valve Problem    History of Present Illness:  Daniel Mendez is a 77 y.o. male for recent left carotid endarterectomy, moderate aortic stenosis, suspected CAD based on abnormal Cardiolite (asymptomatic), essential hypertension, and hyperlipidemia  He is doing well.  He is occasionally had lightheadedness and dizziness.  Has recorded some blood pressures with systolic less than 80.  He denies orthopnea, PND, and edema.  Has rare angina.  Did use sildenafil once and had significant low blood pressure.  Has not used it since that time.   Past Medical History:  Diagnosis Date  . Anginal pain ([place])   . Arthritis    hands  . Coronary artery disease   . Diabetes ([place])    type 2  . GERD (gastroesophageal reflux disease)   . Heart murmur    discovered in 2015 - followed by Dr. Daneen Schick  . History of hiatal hernia   . HTN (hypertension)   . Hypercalcemia   . Hyperglyceridemia   . Hyperlipidemia   . Kidney stone   . Mild aortic stenosis Oct 2014  . Peripheral vascular disease ([place])   . Shortness of breath dyspnea    on exertion  . Sleep apnea    no cpap    Past Surgical History:  Procedure Laterality Date  . COLONOSCOPY WITH PROPOFOL N/A 11/19/2014   Procedure: COLONOSCOPY WITH PROPOFOL;  Surgeon: Garlan Fair, MD;  Location: [facility];  Service: Endoscopy;  Laterality: N/A;  . dental implants     dental post implanted to hold dentures/partials in place  . ENDARTERECTOMY Left 03/10/2015   Procedure: Left Carotid ENDARTERECTOMY ;  Surgeon: Rosetta Posner, MD;  Location: [place];  Service: Vascular;  Laterality: Left;  . FRACTURE SURGERY  1958   Left hand   . HERNIA REPAIR     Bilateral inguinal   . hernia repair b/l  inguinal    . knee arthroscopy left    . RIGHT/LEFT HEART CATH AND CORONARY ANGIOGRAPHY N/A 08/12/2017   Procedure: RIGHT/LEFT HEART CATH AND CORONARY ANGIOGRAPHY;  Surgeon: Belva Crome, MD;  Location: [facility];  Service: Cardiovascular;  Laterality: N/A;  . ROTATOR CUFF REPAIR    . VASCULAR SURGERY    . VASECTOMY      Current Medications: Outpatient Medications Prior to Visit  Medication Sig Dispense Refill  . aspirin EC 81 MG tablet Take 81 mg by mouth at bedtime.    [name] Kitchen atorvastatin (LIPITOR) 20 MG tablet Take 20 mg by mouth at bedtime.     . cyclobenzaprine (FLEXERIL) 5 MG tablet Take 2.5 mg by mouth daily as needed for muscle spasms.     [name] Kitchen glimepiride (AMARYL) 4 MG tablet Take 4 mg by mouth daily with breakfast.    . metFORMIN (GLUCOPHAGE) 500 MG tablet Take 500 mg by mouth 4 (four) times daily.     . metoprolol succinate (TOPROL XL) 25 MG 24 hr tablet Take 0.5 tablets (12.5 mg total) by mouth daily. 30 tablet 11  . Omega-3 Fatty Acids (FISH OIL) 1000 MG CAPS Take 2,000 mg by mouth 2 (two) times daily.     [name] Kitchen omeprazole (PRILOSEC) 20 MG capsule Take 20 mg by  mouth every other day.     . ONE TOUCH ULTRA TEST test strip 1 each by Other route daily.     Glory Rosebush DELICA LANCETS 32R MISC 1 each by Other route daily.     [name] Kitchen PRESCRIPTION MEDICATION TESTOSTERONE INJECTION  Into the muscle every two (2) weeks.    . ranolazine (RANEXA) 1000 MG SR tablet Take 1 tablet (1,000 mg total) by mouth 2 (two) times daily. 180 tablet 3  . sitaGLIPtin (JANUVIA) 100 MG tablet Take 100 mg by mouth daily with breakfast.    . telmisartan (MICARDIS) 80 MG tablet Take 80 mg by mouth at bedtime.     [name] Kitchen spironolactone (ALDACTONE) 25 MG tablet TAKE 1/2 TABLET BY MOUTH EVERY DAY. 15 tablet 11  . acetaminophen (TYLENOL) 500 MG tablet Take 500 mg by mouth daily as needed (pain). Reported on 03/30/2016    . sildenafil (REVATIO) 20 MG tablet Take 60-100 mg by mouth daily as needed (erectile  dysfunction).       No facility-administered medications prior to visit.      Allergies:   Patient has no known allergies.   Social History   Socioeconomic History  . Marital status: Married    Spouse name: Not on file  . Number of children: Not on file  . Years of education: Not on file  . Highest education level: Not on file  Occupational History  . Not on file  Social Needs  . Financial resource strain: Not on file  . Food insecurity:    Worry: Not on file    Inability: Not on file  . Transportation needs:    Medical: Not on file    Non-medical: Not on file  Tobacco Use  . Smoking status: Never Smoker  . Smokeless tobacco: Never Used  Substance and Sexual Activity  . Alcohol use: Yes    Alcohol/week: 0.0 oz    Comment: rarely  . Drug use: No  . Sexual activity: Yes    Birth control/protection: None  Lifestyle  . Physical activity:    Days per week: Not on file    Minutes per session: Not on file  . Stress: Not on file  Relationships  . Social connections:    Talks on phone: Not on file    Gets together: Not on file    Attends religious service: Not on file    Active member of club or organization: Not on file    Attends meetings of clubs or organizations: Not on file    Relationship status: Not on file  Other Topics Concern  . Not on file  Social History Narrative  . Not on file     Family History:  The patient's family history includes CVA in his mother; Cancer in his daughter; Diabetes in his brother; Heart disease in his brother and father; Peripheral vascular disease in his mother.   ROS:   Please see the history of present illness.    Low blood pressures and spells of dizziness. All other systems reviewed and are negative.   PHYSICAL EXAM:   VS:  BP 112/68   Pulse 62   Ht 5\' 10"  (1.778 m)   Wt 171 lb 1.9 oz (77.6 kg)   BMI 24.55 kg/m    GEN: Well nourished, well developed, in no acute distress  HEENT: normal  Neck: no JVD, carotid bruits, or  masses Cardiac: RRR, 3/6 to 4/6 crescendo decrescendo systolic murmur compatible with aortic stenosis.  No diastolic murmurs  heard.  No rubs, or gallops,no edema  Respiratory:  clear to auscultation bilaterally, normal work of breathing GI: soft, nontender, nondistended, + BS MS: no deformity or atrophy  Skin: warm and dry, no rash Neuro:  Alert and Oriented x 3, Strength and sensation are intact Psych: euthymic mood, full affect  Wt Readings from Last 3 Encounters:  04/21/18 171 lb 1.9 oz (77.6 kg)  10/28/17 183 lb 9.6 oz (83.3 kg)  09/22/17 186 lb 6.4 oz (84.6 kg)      Studies/Labs Reviewed:   EKG:  EKG not repeated  Recent Labs: 08/09/2017: BUN 15; Creatinine, Ser 1.25; Hemoglobin 12.9; Platelets 181; Potassium 4.9; Sodium 136   Lipid Panel No results found for: CHOL, TRIG, HDL, CHOLHDL, VLDL, LDLCALC, LDLDIRECT  Additional studies/ records that were reviewed today include:  2D Doppler echocardiogram September 2018:  ------------------------------------------------------------------- Study Conclusions   - Left ventricle: The cavity size was normal. There was moderate   concentric hypertrophy. Systolic function was vigorous. The   estimated ejection fraction was in the range of 65% to 70%. Wall   motion was normal; there were no regional wall motion   abnormalities. Doppler parameters are consistent with abnormal   left ventricular relaxation (grade 1 diastolic dysfunction).   There was no evidence of elevated ventricular filling pressure by   Doppler parameters. - Aortic valve: Valve mobility was restricted. There was moderate   stenosis. Valve area (VTI): 1.33 cm^2. Valve area (Vmax): 1.07   cm^2. Valve area (Vmean): 1.18 cm^2. - Mitral valve: There was trivial regurgitation. - Left atrium: The atrium was mildly dilated. - Right ventricle: Systolic function was normal. - Tricuspid valve: There was trivial regurgitation. - Pulmonary arteries: Systolic pressure was  within the normal   range. - Inferior vena cava: The vessel was normal in size. - Pericardium, extracardiac: There was no pericardial effusion.   Impressions:   - Since the last study from 08/02/2016 transaortic gradients have   increased from 32/20 mmHg to 46/26 mmHg, aortic stenosis is   moderate.       ASSESSMENT:    1. Aortic stenosis, moderate   2. Coronary artery disease involving native coronary artery of native heart with angina pectoris ([place])   3. Dyslipidemia   4. Essential hypertension, uncontrolled      PLAN:  In order of problems listed above:  1. I believe aortic stenosis is getting worse and may account for some of the episodes of lightheadedness and low blood pressures.  For that reason we will discontinue Aldactone.  2D Doppler echocardiogram will be done to assess for progression of aortic stenosis.  Clinical follow-up in 6 months. 2. Sublingual nitroglycerin as needed if prolonged chest pain.  Avoid sildenafil.  Has known total occlusion of the right coronary. 3. LDL cholesterol target less than 70. 4. We have had to doubt back antihypertensive therapy would suggest to me that aortic valve is worsening.  2D Doppler echocardiogram will be done.  Clinical follow-up in 6 months.  Earlier if symptoms persist on less antihypertensive therapy.  2D Doppler echocardiogram will be done soon.    Medication Adjustments/Labs and Tests Ordered: Current medicines are reviewed at length with the patient today.  Concerns regarding medicines are outlined above.  Medication changes, Labs and Tests ordered today are listed in the Patient Instructions below. Patient Instructions  Medication Instructions:  1) DISCONTINUE Spironolactone   Labwork: None Ordered  Testing/Procedures: Your physician has requested that you have an echocardiogram by September.  Echocardiography  is a painless test that uses sound waves to create images of your heart. It provides your doctor with  information about the size and shape of your heart and how well your heart's chambers and valves are working. This procedure takes approximately one hour. There are no restrictions for this procedure.    Follow-Up: Your physician wants you to follow-up in: 6 months with Dr. [name].  You will receive a reminder letter in the mail two months in advance. If you don't receive a letter, please call our office to schedule the follow-up appointment.   Any Other Special Instructions Will Be Listed Below (If Applicable).     If you need a refill on your cardiac medications before your next appointment, please call your pharmacy.     Signed, Sinclair Grooms, MD  04/21/2018 12:13 PM    [facility] [address], [place], [place]  [address] Phone: [phone]; Fax: [phone]

## 2018-04-21 ENCOUNTER — Encounter: Payer: Self-pay | Admitting: Interventional Cardiology

## 2018-04-21 ENCOUNTER — Ambulatory Visit: Payer: Medicare Other | Admitting: Interventional Cardiology

## 2018-04-21 VITALS — BP 112/68 | HR 62 | Ht 70.0 in | Wt 171.1 lb

## 2018-04-21 DIAGNOSIS — I10 Essential (primary) hypertension: Secondary | ICD-10-CM

## 2018-04-21 DIAGNOSIS — E785 Hyperlipidemia, unspecified: Secondary | ICD-10-CM | POA: Diagnosis not present

## 2018-04-21 DIAGNOSIS — I350 Nonrheumatic aortic (valve) stenosis: Secondary | ICD-10-CM

## 2018-04-21 DIAGNOSIS — I25119 Atherosclerotic heart disease of native coronary artery with unspecified angina pectoris: Secondary | ICD-10-CM | POA: Diagnosis not present

## 2018-04-21 NOTE — Patient Instructions (Addendum)
Medication Instructions:  1) DISCONTINUE Spironolactone   Labwork: None Ordered  Testing/Procedures: Your physician has requested that you have an echocardiogram by September.  Echocardiography is a painless test that uses sound waves to create images of your heart. It provides your doctor with information about the size and shape of your heart and how well your heart's chambers and valves are working. This procedure takes approximately one hour. There are no restrictions for this procedure.    Follow-Up: Your physician wants you to follow-up in: 6 months with Dr. [name].  You will receive a reminder letter in the mail two months in advance. If you don't receive a letter, please call our office to schedule the follow-up appointment.   Any Other Special Instructions Will Be Listed Below (If Applicable).     If you need a refill on your cardiac medications before your next appointment, please call your pharmacy.

## 2018-05-02 ENCOUNTER — Ambulatory Visit: Payer: Medicare Other | Admitting: Family

## 2018-05-02 ENCOUNTER — Encounter (HOSPITAL_COMMUNITY): Payer: Medicare Other

## 2018-05-30 ENCOUNTER — Ambulatory Visit (HOSPITAL_COMMUNITY)
Admission: RE | Admit: 2018-05-30 | Discharge: 2018-05-30 | Disposition: A | Discharge status: Home / Self Care | Payer: Medicare Other | Source: Ambulatory Visit | Attending: Family | Admitting: Family

## 2018-05-30 ENCOUNTER — Other Ambulatory Visit: Payer: Self-pay

## 2018-05-30 ENCOUNTER — Ambulatory Visit: Payer: Medicare Other | Admitting: Family

## 2018-05-30 ENCOUNTER — Encounter: Payer: Self-pay | Admitting: Family

## 2018-05-30 VITALS — BP 136/74 | HR 55 | Temp 97.8°F | Resp 18 | Ht 70.0 in | Wt 176.0 lb

## 2018-05-30 DIAGNOSIS — I6522 Occlusion and stenosis of left carotid artery: Secondary | ICD-10-CM

## 2018-05-30 DIAGNOSIS — Z9889 Other specified postprocedural states: Secondary | ICD-10-CM | POA: Insufficient documentation

## 2018-05-30 DIAGNOSIS — I6523 Occlusion and stenosis of bilateral carotid arteries: Secondary | ICD-10-CM | POA: Insufficient documentation

## 2018-05-30 NOTE — Patient Instructions (Signed)

## 2018-05-30 NOTE — Progress Notes (Signed)
Vitals:   05/30/18 1019 05/30/18 1024  BP: (!) 152/76 140/69  Pulse: (!) 52 (!) 55  Resp: 18   Temp: 97.8 F (36.6 C)   TempSrc: Oral   SpO2: 96%   Weight: 176 lb (79.8 kg)   Height: 5\' 10"  (1.778 m)

## 2018-05-30 NOTE — Progress Notes (Signed)
Chief Complaint: Follow up Extracranial Carotid Artery Stenosis   History of Present Illness  Daniel Mendez is a 77 y.o. male who is s/p left carotid endarterectomy by Dr. Donnetta Hutching for severe asymptomatic disease on 03/10/2015. He returns today for follow up. The patient denies any history of TIA or stroke symptoms, specifically he denies a history of amaurosis fugax or monocular blindness, unilateral facial drooping, hemiplegia, or receptive or expressive aphasia.  Pt denies claudication type symptoms with walking. He stays physically active, states [facility].   He has a known heart murmur, aortic stenosis, denies feeling symptomatic with this unless he walks very fast, he will then feel mildly dyspneic.  He had a right rotator cuff repair in April 2017, had physical therapy.   Diabetic: yes, states his last A1C was 6.3, no A1C result on file dating back to 2015 Tobacco use: non-smoker  Pt meds include: Statin : yes ASA: yes Other anticoagulants/antiplatelets: no   Past Medical History:  Diagnosis Date  . Anginal pain ([place])   . Arthritis    hands  . Coronary artery disease   . Diabetes ([place])    type 2  . GERD (gastroesophageal reflux disease)   . Heart murmur    discovered in 2015 - followed by Dr. Daneen Schick  . History of hiatal hernia   . HTN (hypertension)   . Hypercalcemia   . Hyperglyceridemia   . Hyperlipidemia   . Kidney stone   . Mild aortic stenosis Oct 2014  . Peripheral vascular disease ([place])   . Shortness of breath dyspnea    on exertion  . Sleep apnea    no cpap    Social History Social History   Tobacco Use  . Smoking status: Never Smoker  . Smokeless tobacco: Never Used  Substance Use Topics  . Alcohol use: Yes    Alcohol/week: 0.0 oz    Comment: rarely  . Drug use: No    Family History Family History  Problem Relation Age of Onset  . CVA Mother   . Peripheral vascular disease Mother   .  Heart disease Father   . Heart disease Brother   . Diabetes Brother   . Cancer Daughter     Surgical History Past Surgical History:  Procedure Laterality Date  . COLONOSCOPY WITH PROPOFOL N/A 11/19/2014   Procedure: COLONOSCOPY WITH PROPOFOL;  Surgeon: Garlan Fair, MD;  Location: [facility];  Service: Endoscopy;  Laterality: N/A;  . dental implants     dental post implanted to hold dentures/partials in place  . ENDARTERECTOMY Left 03/10/2015   Procedure: Left Carotid ENDARTERECTOMY ;  Surgeon: Rosetta Posner, MD;  Location: [facility];  Service: Vascular;  Laterality: Left;  . FRACTURE SURGERY  1958   Left hand   . HERNIA REPAIR     Bilateral inguinal   . hernia repair b/l inguinal    . knee arthroscopy left    . RIGHT/LEFT HEART CATH AND CORONARY ANGIOGRAPHY N/A 08/12/2017   Procedure: RIGHT/LEFT HEART CATH AND CORONARY ANGIOGRAPHY;  Surgeon: Belva Crome, MD;  Location: [facility];  Service: Cardiovascular;  Laterality: N/A;  . ROTATOR CUFF REPAIR    . VASCULAR SURGERY    . VASECTOMY      No Known Allergies  Current Outpatient Medications  Medication Sig Dispense Refill  . aspirin EC 81 MG tablet Take 81 mg by mouth at bedtime.    [name] Kitchen atorvastatin (LIPITOR) 20  MG tablet Take 20 mg by mouth at bedtime.     . cyclobenzaprine (FLEXERIL) 5 MG tablet Take 2.5 mg by mouth daily as needed for muscle spasms.     [name] Kitchen glimepiride (AMARYL) 4 MG tablet Take 4 mg by mouth daily with breakfast.    . metFORMIN (GLUCOPHAGE) 500 MG tablet Take 500 mg by mouth 4 (four) times daily.     . metoprolol succinate (TOPROL XL) 25 MG 24 hr tablet Take 0.5 tablets (12.5 mg total) by mouth daily. 30 tablet 11  . Omega-3 Fatty Acids (FISH OIL) 1000 MG CAPS Take 2,000 mg by mouth 2 (two) times daily.     [name] Kitchen omeprazole (PRILOSEC) 20 MG capsule Take 20 mg by mouth every other day.     . ONE TOUCH ULTRA TEST test strip 1 each by Other route daily.     Glory Rosebush DELICA LANCETS 30Z MISC 1 each by Other  route daily.     [name] Kitchen PRESCRIPTION MEDICATION TESTOSTERONE INJECTION  Into the muscle every two (2) weeks.    . ranolazine (RANEXA) 1000 MG SR tablet Take 1 tablet (1,000 mg total) by mouth 2 (two) times daily. 180 tablet 3  . sitaGLIPtin (JANUVIA) 100 MG tablet Take 100 mg by mouth daily with breakfast.    . telmisartan (MICARDIS) 80 MG tablet Take 80 mg by mouth at bedtime.      No current facility-administered medications for this visit.     Review of Systems : See HPI for pertinent positives and negatives.  Physical Examination  Vitals:   05/30/18 1019 05/30/18 1024 05/30/18 1026  BP: (!) 152/76 140/69 136/74  Pulse: (!) 52 (!) 55 (!) 55  Resp: 18    Temp: 97.8 F (36.6 C)    TempSrc: Oral    SpO2: 96%    Weight: 176 lb (79.8 kg)    Height: 5\' 10"  (1.778 m)     Body mass index is 25.25 kg/m.  General: WDWN male in NAD GAIT:normal Eyes: PERRLA Pulmonary: Non-labored respirations, good air movement in all fields, CTAB, no rales, rhonchi, or wheezing. Cardiac: regular rhythm, bradycardic (on a beta blocker), +murmur.  VASCULAR EXAM Carotid Bruits Right Left   Negative Transmitted cardiac murmur   Abdominal aortic pulse is not palpable. Radial pulses are 2+ palpable and equal.   LE Pulses Right Left  POPLITEAL not palpable  not palpable  POSTERIOR TIBIAL faintly palpable  palpable   DORSALIS PEDIS ANTERIOR TIBIAL faintly palpable  palpable     Gastrointestinal: soft, nontender, BS WNL, no r/g, no palpable masses. Musculoskeletal: no muscle atrophy/wasting. M/S 5/5 throughout, extremities without ischemic changes. Skin: No rashes, no ulcers, no cellulitis.   Neurologic:  A&O X 3; appropriate affect, sensation is normal; speech is normal, CN 2-12 intact, pain and light touch intact in extremities, motor exam as listed above. Psychiatric: Normal thought content, mood appropriate to clinical  situation.    Assessment: Daniel Mendez is a 77 y.o. male who is s/p left carotid endarterectomy for severe asymptomatic disease on 03/10/2015. He has no history of stroke or TIA.  His atherosclerotic risk factors include well controlled DM and CAD. Fortunately he has never used tobacco.  He takes a daily statin and ASA.   DATA Carotid Duplex (05/30/18): Right ICA: 40-59% stenosis Left ICA: CEA site with 1-39% stenosis Bilateral vertebral artery flow is antegrade.  Bilateral subclavian artery waveforms are normal.  Minimal stenosis of the left CEA site compared to no stenosis on  the exam of 04-29-17.    Plan:  Follow-up in 1year with Carotid Duplex scan.   I discussed in depth with the patient the nature of atherosclerosis, and emphasized the importance of maximal medical management including strict control of blood pressure, blood glucose, and lipid levels, obtaining regular exercise, and continued cessation of smoking.  The patient is aware that without maximal medical management the underlying atherosclerotic disease process will progress, limiting the benefit of any interventions. The patient was given information about stroke prevention and what symptoms should prompt the patient to seek immediate medical care. Thank you for allowing [place] to participate in this patient's care.  Clemon Chambers, RN, MSN, FNP-C [facility] Office: [phone]  Clinic Physician: Early  05/30/18 10:29 AM

## 2018-07-28 DIAGNOSIS — J189 Pneumonia, unspecified organism: Secondary | ICD-10-CM

## 2018-07-28 HISTORY — DX: Pneumonia, unspecified organism: J18.9

## 2018-08-14 ENCOUNTER — Other Ambulatory Visit: Payer: Self-pay

## 2018-08-14 ENCOUNTER — Ambulatory Visit (HOSPITAL_COMMUNITY): Payer: Medicare Other | Attending: Cardiovascular Disease

## 2018-08-14 DIAGNOSIS — E119 Type 2 diabetes mellitus without complications: Secondary | ICD-10-CM | POA: Diagnosis not present

## 2018-08-14 DIAGNOSIS — I2510 Atherosclerotic heart disease of native coronary artery without angina pectoris: Secondary | ICD-10-CM | POA: Diagnosis not present

## 2018-08-14 DIAGNOSIS — I350 Nonrheumatic aortic (valve) stenosis: Secondary | ICD-10-CM

## 2018-08-14 DIAGNOSIS — I082 Rheumatic disorders of both aortic and tricuspid valves: Secondary | ICD-10-CM | POA: Insufficient documentation

## 2018-08-14 DIAGNOSIS — G4730 Sleep apnea, unspecified: Secondary | ICD-10-CM | POA: Insufficient documentation

## 2018-08-14 DIAGNOSIS — I10 Essential (primary) hypertension: Secondary | ICD-10-CM | POA: Diagnosis not present

## 2018-08-14 DIAGNOSIS — E785 Hyperlipidemia, unspecified: Secondary | ICD-10-CM | POA: Insufficient documentation

## 2018-08-18 ENCOUNTER — Telehealth: Payer: Self-pay | Admitting: Interventional Cardiology

## 2018-08-18 NOTE — Telephone Encounter (Signed)
Daniel Mendez, can we get our office note sent to Dr. Lina Sar office?  Thanks!

## 2018-08-18 NOTE — Telephone Encounter (Signed)
New message  Daniel Mendez would like a summary of the last office visit for this patient. Please fax to [phone].

## 2018-08-29 ENCOUNTER — Ambulatory Visit: Payer: Medicare Other | Admitting: Interventional Cardiology

## 2018-08-29 ENCOUNTER — Encounter: Payer: Self-pay | Admitting: Interventional Cardiology

## 2018-08-29 VITALS — BP 140/80 | HR 53 | Ht 70.0 in | Wt 179.8 lb

## 2018-08-29 DIAGNOSIS — I6522 Occlusion and stenosis of left carotid artery: Secondary | ICD-10-CM

## 2018-08-29 DIAGNOSIS — E118 Type 2 diabetes mellitus with unspecified complications: Secondary | ICD-10-CM | POA: Diagnosis not present

## 2018-08-29 DIAGNOSIS — R0602 Shortness of breath: Secondary | ICD-10-CM

## 2018-08-29 DIAGNOSIS — I350 Nonrheumatic aortic (valve) stenosis: Secondary | ICD-10-CM | POA: Diagnosis not present

## 2018-08-29 DIAGNOSIS — I10 Essential (primary) hypertension: Secondary | ICD-10-CM | POA: Diagnosis not present

## 2018-08-29 DIAGNOSIS — E785 Hyperlipidemia, unspecified: Secondary | ICD-10-CM

## 2018-08-29 DIAGNOSIS — I25119 Atherosclerotic heart disease of native coronary artery with unspecified angina pectoris: Secondary | ICD-10-CM

## 2018-08-29 NOTE — H&P (View-Only) (Signed)
Cardiology Office Note:    Date:  08/29/2018   ID:  Daniel Mendez, DOB Aug 28, 1941, MRN [id number]  PCP:  Seward Carol, MD  Cardiologist:  No primary care provider on file.   Referring MD: Seward Carol, MD   Chief Complaint  Patient presents with  . Cardiac Valve Problem    Aortic stenosis  . Coronary Artery Disease    Chronic occlusion of RCA    History of Present Illness:    Daniel Mendez is a 77 y.o. male with a hx of left carotid endarterectomy, severe aortic stenosis, ostial/proximal occlusion of RCA documented by angiography 06/2017, essential hypertension, hypercalcemia, and hyperlipidemia.   Daniel Mendez is doing relatively well.  He notes increasing dyspnea on exertion and chest tightness with activities.  Exertional tolerance has been impacted.  He cannot do his yard work as prior.  He cannot walk as far.  If he rests, the discomfort and dyspnea resolved.  Last year when he developed progressive dyspnea, coronary angios demonstrated chronic occlusion of the RCA at the proximal segment.  Medications were adjusted.  There was slight improvement and now over the last 6 months he is worsening.  Additionally, an echocardiogram done within the past month demonstrates progression of aortic stenosis from moderate to severe with a peak transvalvular gradient of 4.05 m/s.  Last year the velocity was 3.41 m/s.  Past Medical History:  Diagnosis Date  . Anginal pain ([place])   . Arthritis    hands  . Coronary artery disease   . Diabetes ([place])    type 2  . GERD (gastroesophageal reflux disease)   . Heart murmur    discovered in 2015 - followed by Dr. Daneen Schick  . History of hiatal hernia   . HTN (hypertension)   . Hypercalcemia   . Hyperglyceridemia   . Hyperlipidemia   . Kidney stone   . Mild aortic stenosis Oct 2014  . Peripheral vascular disease ([place])   . Shortness of breath dyspnea    on exertion  . Sleep apnea    no cpap    Past Surgical History:  Procedure  Laterality Date  . COLONOSCOPY WITH PROPOFOL N/A 11/19/2014   Procedure: COLONOSCOPY WITH PROPOFOL;  Surgeon: Garlan Fair, MD;  [facility];  Service: Endoscopy;  Laterality: N/A;  . dental implants     dental post implanted to hold dentures/partials in place  . ENDARTERECTOMY Left 03/10/2015   Procedure: Left Carotid ENDARTERECTOMY ;  Surgeon: Rosetta Posner, MD;  Location: [facility];  Service: Vascular;  Laterality: Left;  . FRACTURE SURGERY  1958   Left hand   . HERNIA REPAIR     Bilateral inguinal   . hernia repair b/l inguinal    . knee arthroscopy left    . RIGHT/LEFT HEART CATH AND CORONARY ANGIOGRAPHY N/A 08/12/2017   Procedure: RIGHT/LEFT HEART CATH AND CORONARY ANGIOGRAPHY;  Surgeon: Belva Crome, MD;  Location: [facility];  Service: Cardiovascular;  Laterality: N/A;  . ROTATOR CUFF REPAIR    . VASCULAR SURGERY    . VASECTOMY      Current Medications: Current Meds  Medication Sig  . aspirin EC 81 MG tablet Take 81 mg by mouth at bedtime.  [name] Kitchen atorvastatin (LIPITOR) 20 MG tablet Take 20 mg by mouth at bedtime.   . cyclobenzaprine (FLEXERIL) 5 MG tablet Take 2.5 mg by mouth daily as needed for muscle spasms.   [name] Kitchen glimepiride (AMARYL) 4 MG tablet Take 4 mg  by mouth daily with breakfast.  . metFORMIN (GLUCOPHAGE) 500 MG tablet Take 500 mg by mouth 4 (four) times daily.   . metoprolol succinate (TOPROL XL) 25 MG 24 hr tablet Take 0.5 tablets (12.5 mg total) by mouth daily.  . Omega-3 Fatty Acids (FISH OIL) 1000 MG CAPS Take 2,000 mg by mouth 2 (two) times daily.   [name] Kitchen omeprazole (PRILOSEC) 20 MG capsule Take 20 mg by mouth every other day.   . ONE TOUCH ULTRA TEST test strip 1 each by Other route daily.   Daniel Mendez DELICA LANCETS 97D MISC 1 each by Other route daily.   [name] Kitchen PRESCRIPTION MEDICATION TESTOSTERONE INJECTION  Into the muscle every two (2) weeks.  . ranolazine (RANEXA) 1000 MG SR tablet Take 1 tablet (1,000 mg total) by mouth 2 (two) times daily.  .  sitaGLIPtin (JANUVIA) 100 MG tablet Take 100 mg by mouth daily with breakfast.  . telmisartan (MICARDIS) 80 MG tablet Take 80 mg by mouth at bedtime.   [name] Kitchen testosterone cypionate (DEPOTESTOSTERONE CYPIONATE) 200 MG/ML injection Inject 200 mg into the muscle every 14 (fourteen) days.     Allergies:   Patient has no known allergies.   Social History   Socioeconomic History  . Marital status: Married    Spouse name: Not on file  . Number of children: Not on file  . Years of education: Not on file  . Highest education level: Not on file  Occupational History  . Not on file  Social Needs  . Financial resource strain: Not on file  . Food insecurity:    Worry: Not on file    Inability: Not on file  . Transportation needs:    Medical: Not on file    Non-medical: Not on file  Tobacco Use  . Smoking status: Never Smoker  . Smokeless tobacco: Never Used  Substance and Sexual Activity  . Alcohol use: Yes    Alcohol/week: 0.0 standard drinks    Comment: rarely  . Drug use: No  . Sexual activity: Yes    Birth control/protection: None  Lifestyle  . Physical activity:    Days per week: Not on file    Minutes per session: Not on file  . Stress: Not on file  Relationships  . Social connections:    Talks on phone: Not on file    Gets together: Not on file    Attends religious service: Not on file    Active member of club or organization: Not on file    Attends meetings of clubs or organizations: Not on file    Relationship status: Not on file  Other Topics Concern  . Not on file  Social History Narrative  . Not on file     Family History: The patient's  family history includes CVA in his mother; Cancer in his daughter; Diabetes in his brother; Heart disease in his brother and father; Peripheral vascular disease in his mother.  ROS:   Please see the history of present illness.    Decreased hearing otherwise no specific complaints all other systems reviewed and are  negative.  EKGs/Labs/Other Studies Reviewed:    The following studies were reviewed today:  Doppler echocardiogram 08/14/2018: ------------------------------------------------------------------- Study Conclusions   - Left ventricle: The cavity size was normal. Wall thickness was   increased in a pattern of mild LVH. Systolic function was normal.   The estimated ejection fraction was in the range of 60% to 65%.   Left ventricular diastolic function parameters  were normal. - Aortic valve: Progressive AS since last study mean gradient has   increased from 26 mmHg to 39 mmHg. Valve area 1.1 but indexes to   .52 cm2. There was moderate to severe stenosis. - Left atrium: The atrium was moderately dilated. - Atrial septum: No defect or patent foramen ovale was identified.   EKG:  EKG is  ordered today.  The ekg ordered today demonstrates sinus bradycardia, prominent voltage, otherwise normal.  Recent Labs: No results found for requested labs within last 8760 hours.  Recent Lipid Panel No results found for: CHOL, TRIG, HDL, CHOLHDL, VLDL, LDLCALC, LDLDIRECT  Physical Exam:    VS:  BP 140/80   Pulse (!) 53   Ht 5\' 10"  (1.778 m)   Wt 179 lb 12.8 oz (81.6 kg)   BMI 25.80 kg/m      Wt Readings from Last 3 Encounters:  08/29/18 179 lb 12.8 oz (81.6 kg)  05/30/18 176 lb (79.8 kg)  04/21/18 171 lb 1.9 oz (77.6 kg)     GEN:  Well nourished, well developed in no acute distress HEENT: Normal NECK: No JVD. LYMPHATICS: No lymphadenopathy CARDIAC: RRR, 4/6 high-pitched crescendo decrescendo murmur of aortic stenosis without a diastolic murmur, S4 gallop, no edema. VASCULAR: Decreased/delayed bilateral carotid pulses.  Transmitted bilateral bruits. RESPIRATORY:  Clear to auscultation without rales, wheezing or rhonchi  ABDOMEN: Soft, non-tender, non-distended, No pulsatile mass, MUSCULOSKELETAL: No deformity  SKIN: Warm and dry NEUROLOGIC:  Alert and oriented x 3 PSYCHIATRIC:  Normal  affect   ASSESSMENT:    1. Severe aortic stenosis   2. Coronary artery disease involving native coronary artery of native heart with angina pectoris ([place])   3. Essential hypertension, uncontrolled   4. Diabetes mellitus type 2 with complications ([place])   5. Left carotid artery stenosis   6. Dyslipidemia   7. SOB (shortness of breath)    PLAN:    In order of problems listed above:  1. Aortic stenosis has progressed to severe.  Echo demonstrates an increase in peak velocity across the aortic valve to 4.05 m/s.  Dyspnea is also contributed to by the patient's known coronary disease demonstrated by cath 1 year ago to include total occlusion of the proximal RCA.  He should now be evaluated for aortic valve replacement and coronary revascularization.  Will undergo repeat right and left heart catheterization with coronary angiography prior to referral to Dr. Gilford Raid 2. Known total occlusion of proximal RCA.  Rule out progression with repeat angiography.  Referral to Dr. Cyndia Bent after coronary angiogram. 3. Adequate blood pressure control. 4. Target A1c is less than 7.  Most recent was 6.3. 5. No carotid/neurological symptoms. 6. LDL target less than 70.  Most recent 76 June 2019. 7. Dyspnea is due to both severe aortic stenosis and underlying coronary disease with known total occlusion of the RCA.  Symptoms are impacting the patient's quality of life.  Patient needs to undergo aortic valve replacement likely by SAVR and right coronary revascularization.  Perhaps TAVR could be done if high likelihood of success with RCA CTO procedure but would need to discuss with interventional colleagues.  My general sense is that he needs surgical therapy to take care of both problems.  The patient was counseled to undergo left heart catheterization, coronary angiography, and possible percutaneous coronary intervention with stent implantation. The procedural risks and benefits were discussed in detail. The  risks discussed included death, stroke, myocardial infarction, life-threatening bleeding, limb ischemia, kidney injury, allergy,  and possible emergency cardiac surgery. The risk of these significant complications were estimated to occur less than 1% of the time. After discussion, the patient has agreed to proceed.   Medication Adjustments/Labs and Tests Ordered: Current medicines are reviewed at length with the patient today.  Concerns regarding medicines are outlined above.  Orders Placed This Encounter  Procedures  . Basic metabolic panel  . CBC  . Pro b natriuretic peptide  . Ambulatory referral to Cardiothoracic Surgery  . EKG 12-Lead   No orders of the defined types were placed in this encounter.   Patient Instructions  Medication Instructions:  Your physician recommends that you continue on your current medications as directed. Please refer to the Current Medication list given to you today.  Labwork: BMET, CBC and Pro BNP today  Testing/Procedures: Your physician has requested that you have a cardiac catheterization. Cardiac catheterization is used to diagnose and/or treat various heart conditions. Doctors may recommend this procedure for a number of different reasons. The most common reason is to evaluate chest pain. Chest pain can be a symptom of coronary artery disease (CAD), and cardiac catheterization can show whether plaque is narrowing or blocking your heart's arteries. This procedure is also used to evaluate the valves, as well as measure the blood flow and oxygen levels in different parts of your heart. For further information please visit [email or web address]. Please follow instruction sheet, as given.   Follow-Up: You have been referred to Dr. Cyndia Bent at [facility].  We would like for you to see him about a week after your heart catheterization.   Your physician recommends that you schedule a follow-up appointment in: 3-4 months with Dr. [name].    Any Other Special  Instructions Will Be Listed Below (If Applicable).     [facility] [place], [place] [address] Dept: [phone] Loc: [phone]  Daniel Mendez  08/29/2018  You are scheduled for a Cardiac Catheterization on Friday, October 11 with Dr. Daneen Schick.  1. Please arrive at the [facility] (Main Entrance A) at [facility]: [address] [place], [place] [address] at 5:30 AM (This time is two hours before your procedure to ensure your preparation). Free valet parking service is available.   Special note: Every effort is made to have your procedure done on time. Please understand that emergencies sometimes delay scheduled procedures.  2. Diet: Do not eat solid foods after midnight.  The patient may have clear liquids until 5am upon the day of the procedure.  3. Labs: You will have labs drawn today  4. Medication instructions in preparation for your procedure:   Contrast Allergy: No  Do not take your Amaryl or Januvia the morning of your procedure.  You will need to hold your Metformin 24 hours before and 48 hours after your procedure.  Some of your instructions may change depending on your lab work. Webb Silversmith, RN or myself will contact you once the results are in.   On the morning of your procedure, take your Aspirin and any morning medicines NOT listed above.  You may use sips of water.  5. Plan for one night stay--bring personal belongings. 6. Bring a current list of your medications and current insurance cards. 7. You MUST have a responsible person to drive you home. 8. Someone MUST be with you the first 24 hours after you arrive home or your discharge will be delayed. 9. Please wear  clothes that are easy to get on and off and wear slip-on shoes.  Thank you for allowing [place] to care for you!   -- [facility]    If you need a refill  on your cardiac medications before your next appointment, please call your pharmacy.      Signed, Sinclair Grooms, MD  08/29/2018 1:09 PM    [place]

## 2018-08-29 NOTE — Progress Notes (Signed)
Cardiology Office Note:    Date:  08/29/2018   ID:  Daniel Mendez, DOB Nov 23, 1940, MRN [id number]  PCP:  Seward Carol, MD  Cardiologist:  No primary care provider on file.   Referring MD: Seward Carol, MD   Chief Complaint  Patient presents with  . Cardiac Valve Problem    Aortic stenosis  . Coronary Artery Disease    Chronic occlusion of RCA    History of Present Illness:    Daniel Mendez is a 77 y.o. male with a hx of left carotid endarterectomy, severe aortic stenosis, ostial/proximal occlusion of RCA documented by angiography 06/2017, essential hypertension, hypercalcemia, and hyperlipidemia.   Daniel Mendez is doing relatively well.  He notes increasing dyspnea on exertion and chest tightness with activities.  Exertional tolerance has been impacted.  He cannot do his yard work as prior.  He cannot walk as far.  If he rests, the discomfort and dyspnea resolved.  Last year when he developed progressive dyspnea, coronary angios demonstrated chronic occlusion of the RCA at the proximal segment.  Medications were adjusted.  There was slight improvement and now over the last 6 months he is worsening.  Additionally, an echocardiogram done within the past month demonstrates progression of aortic stenosis from moderate to severe with a peak transvalvular gradient of 4.05 m/s.  Last year the velocity was 3.41 m/s.  Past Medical History:  Diagnosis Date  . Anginal pain ([place])   . Arthritis    hands  . Coronary artery disease   . Diabetes ([place])    type 2  . GERD (gastroesophageal reflux disease)   . Heart murmur    discovered in 2015 - followed by Dr. Daneen Schick  . History of hiatal hernia   . HTN (hypertension)   . Hypercalcemia   . Hyperglyceridemia   . Hyperlipidemia   . Kidney stone   . Mild aortic stenosis Oct 2014  . Peripheral vascular disease ([place])   . Shortness of breath dyspnea    on exertion  . Sleep apnea    no cpap    Past Surgical History:  Procedure  Laterality Date  . COLONOSCOPY WITH PROPOFOL N/A 11/19/2014   Procedure: COLONOSCOPY WITH PROPOFOL;  Surgeon: Garlan Fair, MD;  Location: [facility];  Service: Endoscopy;  Laterality: N/A;  . dental implants     dental post implanted to hold dentures/partials in place  . ENDARTERECTOMY Left 03/10/2015   Procedure: Left Carotid ENDARTERECTOMY ;  Surgeon: Rosetta Posner, MD;  Location: [facility];  Service: Vascular;  Laterality: Left;  . FRACTURE SURGERY  1958   Left hand   . HERNIA REPAIR     Bilateral inguinal   . hernia repair b/l inguinal    . knee arthroscopy left    . RIGHT/LEFT HEART CATH AND CORONARY ANGIOGRAPHY N/A 08/12/2017   Procedure: RIGHT/LEFT HEART CATH AND CORONARY ANGIOGRAPHY;  Surgeon: Belva Crome, MD;  Location: [facility];  Service: Cardiovascular;  Laterality: N/A;  . ROTATOR CUFF REPAIR    . VASCULAR SURGERY    . VASECTOMY      Current Medications: Current Meds  Medication Sig  . aspirin EC 81 MG tablet Take 81 mg by mouth at bedtime.  [name] Kitchen atorvastatin (LIPITOR) 20 MG tablet Take 20 mg by mouth at bedtime.   . cyclobenzaprine (FLEXERIL) 5 MG tablet Take 2.5 mg by mouth daily as needed for muscle spasms.   [name] Kitchen glimepiride (AMARYL) 4 MG tablet Take 4 mg  by mouth daily with breakfast.  . metFORMIN (GLUCOPHAGE) 500 MG tablet Take 500 mg by mouth 4 (four) times daily.   . metoprolol succinate (TOPROL XL) 25 MG 24 hr tablet Take 0.5 tablets (12.5 mg total) by mouth daily.  . Omega-3 Fatty Acids (FISH OIL) 1000 MG CAPS Take 2,000 mg by mouth 2 (two) times daily.   [name] Kitchen omeprazole (PRILOSEC) 20 MG capsule Take 20 mg by mouth every other day.   . ONE TOUCH ULTRA TEST test strip 1 each by Other route daily.   Glory Rosebush DELICA LANCETS 13Y MISC 1 each by Other route daily.   [name] Kitchen PRESCRIPTION MEDICATION TESTOSTERONE INJECTION  Into the muscle every two (2) weeks.  . ranolazine (RANEXA) 1000 MG SR tablet Take 1 tablet (1,000 mg total) by mouth 2 (two) times daily.  .  sitaGLIPtin (JANUVIA) 100 MG tablet Take 100 mg by mouth daily with breakfast.  . telmisartan (MICARDIS) 80 MG tablet Take 80 mg by mouth at bedtime.   [name] Kitchen testosterone cypionate (DEPOTESTOSTERONE CYPIONATE) 200 MG/ML injection Inject 200 mg into the muscle every 14 (fourteen) days.     Allergies:   Patient has no known allergies.   Social History   Socioeconomic History  . Marital status: Married    Spouse name: Not on file  . Number of children: Not on file  . Years of education: Not on file  . Highest education level: Not on file  Occupational History  . Not on file  Social Needs  . Financial resource strain: Not on file  . Food insecurity:    Worry: Not on file    Inability: Not on file  . Transportation needs:    Medical: Not on file    Non-medical: Not on file  Tobacco Use  . Smoking status: Never Smoker  . Smokeless tobacco: Never Used  Substance and Sexual Activity  . Alcohol use: Yes    Alcohol/week: 0.0 standard drinks    Comment: rarely  . Drug use: No  . Sexual activity: Yes    Birth control/protection: None  Lifestyle  . Physical activity:    Days per week: Not on file    Minutes per session: Not on file  . Stress: Not on file  Relationships  . Social connections:    Talks on phone: Not on file    Gets together: Not on file    Attends religious service: Not on file    Active member of club or organization: Not on file    Attends meetings of clubs or organizations: Not on file    Relationship status: Not on file  Other Topics Concern  . Not on file  Social History Narrative  . Not on file     Family History: The patient's  family history includes CVA in his mother; Cancer in his daughter; Diabetes in his brother; Heart disease in his brother and father; Peripheral vascular disease in his mother.  ROS:   Please see the history of present illness.    Decreased hearing otherwise no specific complaints all other systems reviewed and are  negative.  EKGs/Labs/Other Studies Reviewed:    The following studies were reviewed today:  Doppler echocardiogram 08/14/2018: ------------------------------------------------------------------- Study Conclusions   - Left ventricle: The cavity size was normal. Wall thickness was   increased in a pattern of mild LVH. Systolic function was normal.   The estimated ejection fraction was in the range of 60% to 65%.   Left ventricular diastolic function parameters  were normal. - Aortic valve: Progressive AS since last study mean gradient has   increased from 26 mmHg to 39 mmHg. Valve area 1.1 but indexes to   .52 cm2. There was moderate to severe stenosis. - Left atrium: The atrium was moderately dilated. - Atrial septum: No defect or patent foramen ovale was identified.   EKG:  EKG is  ordered today.  The ekg ordered today demonstrates sinus bradycardia, prominent voltage, otherwise normal.  Recent Labs: No results found for requested labs within last 8760 hours.  Recent Lipid Panel No results found for: CHOL, TRIG, HDL, CHOLHDL, VLDL, LDLCALC, LDLDIRECT  Physical Exam:    VS:  BP 140/80   Pulse (!) 53   Ht 5\' 10"  (1.778 m)   Wt 179 lb 12.8 oz (81.6 kg)   BMI 25.80 kg/m      Wt Readings from Last 3 Encounters:  08/29/18 179 lb 12.8 oz (81.6 kg)  05/30/18 176 lb (79.8 kg)  04/21/18 171 lb 1.9 oz (77.6 kg)     GEN:  Well nourished, well developed in no acute distress HEENT: Normal NECK: No JVD. LYMPHATICS: No lymphadenopathy CARDIAC: RRR, 4/6 high-pitched crescendo decrescendo murmur of aortic stenosis without a diastolic murmur, S4 gallop, no edema. VASCULAR: Decreased/delayed bilateral carotid pulses.  Transmitted bilateral bruits. RESPIRATORY:  Clear to auscultation without rales, wheezing or rhonchi  ABDOMEN: Soft, non-tender, non-distended, No pulsatile mass, MUSCULOSKELETAL: No deformity  SKIN: Warm and dry NEUROLOGIC:  Alert and oriented x 3 PSYCHIATRIC:  Normal  affect   ASSESSMENT:    1. Severe aortic stenosis   2. Coronary artery disease involving native coronary artery of native heart with angina pectoris ([place])   3. Essential hypertension, uncontrolled   4. Diabetes mellitus type 2 with complications ([place])   5. Left carotid artery stenosis   6. Dyslipidemia   7. SOB (shortness of breath)    PLAN:    In order of problems listed above:  1. Aortic stenosis has progressed to severe.  Echo demonstrates an increase in peak velocity across the aortic valve to 4.05 m/s.  Dyspnea is also contributed to by the patient's known coronary disease demonstrated by cath 1 year ago to include total occlusion of the proximal RCA.  He should now be evaluated for aortic valve replacement and coronary revascularization.  Will undergo repeat right and left heart catheterization with coronary angiography prior to referral to Dr. Gilford Raid 2. Known total occlusion of proximal RCA.  Rule out progression with repeat angiography.  Referral to Dr. Cyndia Bent after coronary angiogram. 3. Adequate blood pressure control. 4. Target A1c is less than 7.  Most recent was 6.3. 5. No carotid/neurological symptoms. 6. LDL target less than 70.  Most recent 76 June 2019. 7. Dyspnea is due to both severe aortic stenosis and underlying coronary disease with known total occlusion of the RCA.  Symptoms are impacting the patient's quality of life.  Patient needs to undergo aortic valve replacement likely by SAVR and right coronary revascularization.  Perhaps TAVR could be done if high likelihood of success with RCA CTO procedure but would need to discuss with interventional colleagues.  My general sense is that he needs surgical therapy to take care of both problems.  The patient was counseled to undergo left heart catheterization, coronary angiography, and possible percutaneous coronary intervention with stent implantation. The procedural risks and benefits were discussed in detail. The  risks discussed included death, stroke, myocardial infarction, life-threatening bleeding, limb ischemia, kidney injury, allergy,  and possible emergency cardiac surgery. The risk of these significant complications were estimated to occur less than 1% of the time. After discussion, the patient has agreed to proceed.   Medication Adjustments/Labs and Tests Ordered: Current medicines are reviewed at length with the patient today.  Concerns regarding medicines are outlined above.  Orders Placed This Encounter  Procedures  . Basic metabolic panel  . CBC  . Pro b natriuretic peptide  . Ambulatory referral to Cardiothoracic Surgery  . EKG 12-Lead   No orders of the defined types were placed in this encounter.   Patient Instructions  Medication Instructions:  Your physician recommends that you continue on your current medications as directed. Please refer to the Current Medication list given to you today.  Labwork: BMET, CBC and Pro BNP today  Testing/Procedures: Your physician has requested that you have a cardiac catheterization. Cardiac catheterization is used to diagnose and/or treat various heart conditions. Doctors may recommend this procedure for a number of different reasons. The most common reason is to evaluate chest pain. Chest pain can be a symptom of coronary artery disease (CAD), and cardiac catheterization can show whether plaque is narrowing or blocking your heart's arteries. This procedure is also used to evaluate the valves, as well as measure the blood flow and oxygen levels in different parts of your heart. For further information please visit HugeFiesta.tn. Please follow instruction sheet, as given.   Follow-Up: You have been referred to Dr. Cyndia Bent at [facility].  We would like for you to see him about a week after your heart catheterization.   Your physician recommends that you schedule a follow-up appointment in: 3-4 months with Dr. [name].    Any Other Special  Instructions Will Be Listed Below (If Applicable).     [facility] OFFICE [place], [place] [address] Dept: [phone] Loc: [phone]  Daniel Mendez  08/29/2018  You are scheduled for a Cardiac Catheterization on Friday, October 11 with Dr. Daneen Schick.  1. Please arrive at the [facility] (Main Entrance A) at [facility]: [address] [place], [place] [address] at 5:30 AM (This time is two hours before your procedure to ensure your preparation). Free valet parking service is available.   Special note: Every effort is made to have your procedure done on time. Please understand that emergencies sometimes delay scheduled procedures.  2. Diet: Do not eat solid foods after midnight.  The patient may have clear liquids until 5am upon the day of the procedure.  3. Labs: You will have labs drawn today  4. Medication instructions in preparation for your procedure:   Contrast Allergy: No  Do not take your Amaryl or Januvia the morning of your procedure.  You will need to hold your Metformin 24 hours before and 48 hours after your procedure.  Some of your instructions may change depending on your lab work. Webb Silversmith, RN or myself will contact you once the results are in.   On the morning of your procedure, take your Aspirin and any morning medicines NOT listed above.  You may use sips of water.  5. Plan for one night stay--bring personal belongings. 6. Bring a current list of your medications and current insurance cards. 7. You MUST have a responsible person to drive you home. 8. Someone MUST be with you the first 24 hours after you arrive home or your discharge will be delayed. 9. Please wear  clothes that are easy to get on and off and wear slip-on shoes.  Thank you for allowing [place] to care for you!   -- [facility]    If you need a refill  on your cardiac medications before your next appointment, please call your pharmacy.      Signed, Sinclair Grooms, MD  08/29/2018 1:09 PM    [place]

## 2018-08-29 NOTE — Patient Instructions (Addendum)
Medication Instructions:  Your physician recommends that you continue on your current medications as directed. Please refer to the Current Medication list given to you today.  Labwork: BMET, CBC and Pro BNP today  Testing/Procedures: Your physician has requested that you have a cardiac catheterization. Cardiac catheterization is used to diagnose and/or treat various heart conditions. Doctors may recommend this procedure for a number of different reasons. The most common reason is to evaluate chest pain. Chest pain can be a symptom of coronary artery disease (CAD), and cardiac catheterization can show whether plaque is narrowing or blocking your heart's arteries. This procedure is also used to evaluate the valves, as well as measure the blood flow and oxygen levels in different parts of your heart. For further information please visit HugeFiesta.tn. Please follow instruction sheet, as given.   Follow-Up: You have been referred to Dr. Cyndia Bent at [facility].  We would like for you to see him about a week after your heart catheterization.   Your physician recommends that you schedule a follow-up appointment in: 3-4 months with Dr. [name].    Any Other Special Instructions Will Be Listed Below (If Applicable).     [facility] OFFICE [place], [place] [address] Dept: [phone] Loc: [phone]  ELLEN MAYOL  08/29/2018  You are scheduled for a Cardiac Catheterization on Friday, October 11 with Dr. Daneen Schick.  1. Please arrive at the [facility] (Main Entrance A) at [facility]: [address] [place], [place] [address] at 5:30 AM (This time is two hours before your procedure to ensure your preparation). Free valet parking service is available.   Special note: Every effort is made to have your procedure done on time. Please understand that emergencies sometimes delay scheduled  procedures.  2. Diet: Do not eat solid foods after midnight.  The patient may have clear liquids until 5am upon the day of the procedure.  3. Labs: You will have labs drawn today  4. Medication instructions in preparation for your procedure:   Contrast Allergy: No  Do not take your Amaryl or Januvia the morning of your procedure.  You will need to hold your Metformin 24 hours before and 48 hours after your procedure.  Some of your instructions may change depending on your lab work. Webb Silversmith, RN or myself will contact you once the results are in.   On the morning of your procedure, take your Aspirin and any morning medicines NOT listed above.  You may use sips of water.  5. Plan for one night stay--bring personal belongings. 6. Bring a current list of your medications and current insurance cards. 7. You MUST have a responsible person to drive you home. 8. Someone MUST be with you the first 24 hours after you arrive home or your discharge will be delayed. 9. Please wear clothes that are easy to get on and off and wear slip-on shoes.  Thank you for allowing [place] to care for you!   -- [facility]    If you need a refill on your cardiac medications before your next appointment, please call your pharmacy.

## 2018-08-30 LAB — CBC
HEMOGLOBIN: 12.7 g/dL — AB (ref 13.0–17.7)
Hematocrit: 37.9 % (ref 37.5–51.0)
MCH: 27.3 pg (ref 26.6–33.0)
MCHC: 33.5 g/dL (ref 31.5–35.7)
MCV: 81 fL (ref 79–97)
PLATELETS: 168 10*3/uL (ref 150–450)
RBC: 4.66 x10E6/uL (ref 4.14–5.80)
RDW: 14.1 % (ref 12.3–15.4)
WBC: 8.3 10*3/uL (ref 3.4–10.8)

## 2018-08-30 LAB — BASIC METABOLIC PANEL
BUN/Creatinine Ratio: 13 (ref 10–24)
BUN: 15 mg/dL (ref 8–27)
CALCIUM: 10.8 mg/dL — AB (ref 8.6–10.2)
CO2: 23 mmol/L (ref 20–29)
CREATININE: 1.16 mg/dL (ref 0.76–1.27)
Chloride: 104 mmol/L (ref 96–106)
GFR calc Af Amer: 69 mL/min/{1.73_m2} (ref >59)
GFR, EST NON AFRICAN AMERICAN: 60 mL/min/{1.73_m2} (ref >59)
GLUCOSE: 54 mg/dL — AB (ref 65–99)
POTASSIUM: 5 mmol/L (ref 3.5–5.2)
SODIUM: 142 mmol/L (ref 134–144)

## 2018-08-30 LAB — PRO B NATRIURETIC PEPTIDE: NT-PRO BNP: 156 pg/mL (ref 0–486)

## 2018-08-31 ENCOUNTER — Telehealth: Payer: Self-pay | Admitting: *Deleted

## 2018-08-31 NOTE — Telephone Encounter (Signed)
Pt contacted pre-catheterization scheduled at [facility] for: Friday September 01, 2018 7:30 AM Verified arrival time and place: [facility] Entrance A at: 5:30 AM  No solid food after midnight prior to cath, clear liquids until 5 AM day of procedure. Verified no known allergies.  Hold: Metformin-day of procedure and 48 hours post procedure. Januvia-AM of procedure. Glimepiride-AM of procedure.   Except hold medications AM meds can be  taken pre-cath with sip of water including: ASA 81 mg  Confirmed patient has responsible person to drive home post procedure and for 24 hours after you arrive home: yes

## 2018-09-01 ENCOUNTER — Other Ambulatory Visit: Payer: Self-pay

## 2018-09-01 ENCOUNTER — Ambulatory Visit (HOSPITAL_COMMUNITY)
Admission: RE | Admit: 2018-09-01 | Discharge: 2018-09-01 | Disposition: A | Discharge status: Home / Self Care | Payer: Medicare Other | Source: Ambulatory Visit | Attending: Interventional Cardiology | Admitting: Interventional Cardiology

## 2018-09-01 ENCOUNTER — Encounter (HOSPITAL_COMMUNITY)
Admission: RE | Disposition: A | Discharge status: Home / Self Care | Payer: Self-pay | Source: Ambulatory Visit | Attending: Interventional Cardiology

## 2018-09-01 ENCOUNTER — Encounter (HOSPITAL_COMMUNITY): Payer: Self-pay | Admitting: Interventional Cardiology

## 2018-09-01 DIAGNOSIS — E781 Pure hyperglyceridemia: Secondary | ICD-10-CM | POA: Diagnosis not present

## 2018-09-01 DIAGNOSIS — Z7984 Long term (current) use of oral hypoglycemic drugs: Secondary | ICD-10-CM | POA: Diagnosis not present

## 2018-09-01 DIAGNOSIS — I25119 Atherosclerotic heart disease of native coronary artery with unspecified angina pectoris: Secondary | ICD-10-CM | POA: Diagnosis present

## 2018-09-01 DIAGNOSIS — Z8249 Family history of ischemic heart disease and other diseases of the circulatory system: Secondary | ICD-10-CM | POA: Insufficient documentation

## 2018-09-01 DIAGNOSIS — E119 Type 2 diabetes mellitus without complications: Secondary | ICD-10-CM | POA: Insufficient documentation

## 2018-09-01 DIAGNOSIS — Z9852 Vasectomy status: Secondary | ICD-10-CM | POA: Diagnosis not present

## 2018-09-01 DIAGNOSIS — M1990 Unspecified osteoarthritis, unspecified site: Secondary | ICD-10-CM | POA: Diagnosis not present

## 2018-09-01 DIAGNOSIS — I2510 Atherosclerotic heart disease of native coronary artery without angina pectoris: Secondary | ICD-10-CM

## 2018-09-01 DIAGNOSIS — Z7982 Long term (current) use of aspirin: Secondary | ICD-10-CM | POA: Insufficient documentation

## 2018-09-01 DIAGNOSIS — G4730 Sleep apnea, unspecified: Secondary | ICD-10-CM | POA: Diagnosis not present

## 2018-09-01 DIAGNOSIS — I350 Nonrheumatic aortic (valve) stenosis: Secondary | ICD-10-CM

## 2018-09-01 DIAGNOSIS — Z823 Family history of stroke: Secondary | ICD-10-CM | POA: Insufficient documentation

## 2018-09-01 DIAGNOSIS — I6522 Occlusion and stenosis of left carotid artery: Secondary | ICD-10-CM | POA: Insufficient documentation

## 2018-09-01 DIAGNOSIS — I10 Essential (primary) hypertension: Secondary | ICD-10-CM | POA: Diagnosis present

## 2018-09-01 DIAGNOSIS — Z9889 Other specified postprocedural states: Secondary | ICD-10-CM | POA: Diagnosis not present

## 2018-09-01 DIAGNOSIS — Z833 Family history of diabetes mellitus: Secondary | ICD-10-CM | POA: Diagnosis not present

## 2018-09-01 DIAGNOSIS — R0600 Dyspnea, unspecified: Secondary | ICD-10-CM | POA: Diagnosis not present

## 2018-09-01 DIAGNOSIS — Z79899 Other long term (current) drug therapy: Secondary | ICD-10-CM | POA: Insufficient documentation

## 2018-09-01 DIAGNOSIS — Z87442 Personal history of urinary calculi: Secondary | ICD-10-CM | POA: Insufficient documentation

## 2018-09-01 DIAGNOSIS — K219 Gastro-esophageal reflux disease without esophagitis: Secondary | ICD-10-CM | POA: Diagnosis not present

## 2018-09-01 DIAGNOSIS — Z955 Presence of coronary angioplasty implant and graft: Secondary | ICD-10-CM | POA: Diagnosis not present

## 2018-09-01 DIAGNOSIS — E785 Hyperlipidemia, unspecified: Secondary | ICD-10-CM | POA: Diagnosis present

## 2018-09-01 DIAGNOSIS — E118 Type 2 diabetes mellitus with unspecified complications: Secondary | ICD-10-CM | POA: Diagnosis present

## 2018-09-01 HISTORY — PX: RIGHT/LEFT HEART CATH AND CORONARY ANGIOGRAPHY: CATH118266

## 2018-09-01 LAB — POCT I-STAT 3, ART BLOOD GAS (G3+)
ACID-BASE EXCESS: 2 mmol/L (ref 0.0–2.0)
Bicarbonate: 28 mmol/L (ref 20.0–28.0)
O2 SAT: 96 %
PCO2 ART: 50.2 mmHg — AB (ref 32.0–48.0)
TCO2: 30 mmol/L (ref 22–32)
pH, Arterial: 7.354 (ref 7.350–7.450)
pO2, Arterial: 86 mmHg (ref 83.0–108.0)

## 2018-09-01 LAB — POCT I-STAT 3, VENOUS BLOOD GAS (G3P V)
ACID-BASE EXCESS: 1 mmol/L (ref 0.0–2.0)
Bicarbonate: 27.8 mmol/L (ref 20.0–28.0)
O2 Saturation: 68 %
PH VEN: 7.348 (ref 7.250–7.430)
TCO2: 29 mmol/L (ref 22–32)
pCO2, Ven: 50.6 mmHg (ref 44.0–60.0)
pO2, Ven: 38 mmHg (ref 32.0–45.0)

## 2018-09-01 LAB — GLUCOSE, CAPILLARY: Glucose-Capillary: 169 mg/dL — ABNORMAL HIGH (ref 70–99)

## 2018-09-01 SURGERY — RIGHT/LEFT HEART CATH AND CORONARY ANGIOGRAPHY
Anesthesia: LOCAL

## 2018-09-01 MED ORDER — MIDAZOLAM HCL 2 MG/2ML IJ SOLN
INTRAMUSCULAR | Status: DC | PRN
  Administered 2018-09-01: 1 mg via INTRAVENOUS
  Administered 2018-09-01: 0.5 mg via INTRAVENOUS

## 2018-09-01 MED ORDER — MIDAZOLAM HCL 2 MG/2ML IJ SOLN
INTRAMUSCULAR | Status: AC
  Filled 2018-09-01: qty 2

## 2018-09-01 MED ORDER — HEPARIN SODIUM (PORCINE) 1000 UNIT/ML IJ SOLN
INTRAMUSCULAR | Status: DC | PRN
  Administered 2018-09-01: 4000 [IU] via INTRAVENOUS

## 2018-09-01 MED ORDER — IOHEXOL 350 MG/ML SOLN
INTRAVENOUS | Status: DC | PRN
  Administered 2018-09-01: 80 mL via INTRACARDIAC

## 2018-09-01 MED ORDER — LIDOCAINE HCL (PF) 1 % IJ SOLN
INTRAMUSCULAR | Status: AC
  Filled 2018-09-01: qty 30

## 2018-09-01 MED ORDER — ASPIRIN 81 MG PO CHEW: 81.0000 mg | CHEWABLE_TABLET | Freq: Every day | ORAL | Status: DC

## 2018-09-01 MED ORDER — SODIUM CHLORIDE 0.9% FLUSH: 3.0000 mL | Freq: Two times a day (BID) | INTRAVENOUS | Status: DC

## 2018-09-01 MED ORDER — VERAPAMIL HCL 2.5 MG/ML IV SOLN
INTRAVENOUS | Status: DC | PRN
  Administered 2018-09-01: 10 mL via INTRA_ARTERIAL

## 2018-09-01 MED ORDER — HEPARIN (PORCINE) IN NACL 1000-0.9 UT/500ML-% IV SOLN
INTRAVENOUS | Status: AC
  Filled 2018-09-01: qty 500

## 2018-09-01 MED ORDER — FENTANYL CITRATE (PF) 100 MCG/2ML IJ SOLN
INTRAMUSCULAR | Status: DC | PRN
  Administered 2018-09-01: 25 ug via INTRAVENOUS

## 2018-09-01 MED ORDER — SODIUM CHLORIDE 0.9% FLUSH: 3.0000 mL | INTRAVENOUS | Status: DC | PRN

## 2018-09-01 MED ORDER — VERAPAMIL HCL 2.5 MG/ML IV SOLN
INTRAVENOUS | Status: AC
  Filled 2018-09-01: qty 2

## 2018-09-01 MED ORDER — SODIUM CHLORIDE 0.9 % WEIGHT BASED INFUSION: 1.0000 mL/kg/h | INTRAVENOUS | Status: DC

## 2018-09-01 MED ORDER — FENTANYL CITRATE (PF) 100 MCG/2ML IJ SOLN
INTRAMUSCULAR | Status: AC
  Filled 2018-09-01: qty 2

## 2018-09-01 MED ORDER — OXYCODONE HCL 5 MG PO TABS: 5.0000 mg | ORAL_TABLET | ORAL | Status: DC | PRN

## 2018-09-01 MED ORDER — SODIUM CHLORIDE 0.9 % IV SOLN: 250.0000 mL | INTRAVENOUS | Status: DC | PRN

## 2018-09-01 MED ORDER — LIDOCAINE HCL (PF) 1 % IJ SOLN
INTRAMUSCULAR | Status: DC | PRN
  Administered 2018-09-01 (×2): 2 mL

## 2018-09-01 MED ORDER — ACETAMINOPHEN 325 MG PO TABS: 650.0000 mg | ORAL_TABLET | ORAL | Status: DC | PRN

## 2018-09-01 MED ORDER — ASPIRIN 81 MG PO CHEW: 81.0000 mg | CHEWABLE_TABLET | ORAL | Status: DC

## 2018-09-01 MED ORDER — ONDANSETRON HCL 4 MG/2ML IJ SOLN: 4.0000 mg | Freq: Four times a day (QID) | INTRAMUSCULAR | Status: DC | PRN

## 2018-09-01 MED ORDER — HEPARIN (PORCINE) IN NACL 1000-0.9 UT/500ML-% IV SOLN
INTRAVENOUS | Status: DC | PRN
  Administered 2018-09-01 (×2): 500 mL

## 2018-09-01 MED ORDER — SODIUM CHLORIDE 0.9 % WEIGHT BASED INFUSION
3.0000 mL/kg/h | INTRAVENOUS | Status: AC
  Administered 2018-09-01: 3 mL/kg/h via INTRAVENOUS

## 2018-09-01 SURGICAL SUPPLY — 13 items
CATH BALLN WEDGE 5F 110CM (CATHETERS) ×1 IMPLANT
CATH INFINITI 5 FR JL3.5 (CATHETERS) ×1 IMPLANT
CATH INFINITI JR4 5F (CATHETERS) ×1 IMPLANT
DEVICE RAD COMP TR BAND LRG (VASCULAR PRODUCTS) ×1 IMPLANT
GLIDESHEATH SLEND A-KIT 6F 22G (SHEATH) ×1 IMPLANT
GUIDEWIRE INQWIRE 1.5J.035X260 (WIRE) IMPLANT
INQWIRE 1.5J .035X260CM (WIRE) ×2
KIT HEART LEFT (KITS) ×2 IMPLANT
PACK CARDIAC CATHETERIZATION (CUSTOM PROCEDURE TRAY) ×2 IMPLANT
SHEATH GLIDE SLENDER 4/5FR (SHEATH) ×1 IMPLANT
SHEATH PROBE COVER 6X72 (BAG) ×1 IMPLANT
TRANSDUCER W/STOPCOCK (MISCELLANEOUS) ×2 IMPLANT
TUBING CIL FLEX 10 FLL-RA (TUBING) ×2 IMPLANT

## 2018-09-01 NOTE — CV Procedure (Signed)
   Left and right heart cath performed percutaneously from the right antecubital vein and right radial artery.  Findings of moderate aortic stenosis with valve area 1.72 cm based upon mean gradient of 17 mmHg and peak to peak gradient of 21 mmHg.  This is discordant with the echo findings which demonstrated a peak gradient greater than 60 on a mean gradient of 40 mmHg.  Chronic total occlusion of relatively small right coronary  Moderate diffuse disease in the LAD without focally severe disease.  50% ostial circumflex.  Previously documented normal LV function  Normal right heart pressures.  Plan to refer to [facility] for consideration of valve therapy which will likely be SAVR and CABG.  No complications.

## 2018-09-01 NOTE — Interval H&P Note (Signed)
Cath Lab Visit (complete for each Cath Lab visit)  Clinical Evaluation Leading to the Procedure:   ACS: No.  Non-ACS:    Anginal Classification: CCS III  Anti-ischemic medical therapy: Maximal Therapy (2 or more classes of medications)  Non-Invasive Test Results: Intermediate-risk stress test findings: cardiac mortality 1-3%/year  Prior CABG: No previous CABG      History and Physical Interval Note:  09/01/2018 7:32 AM  Daniel Mendez  has presented today for surgery, with the diagnosis of as  The various methods of treatment have been discussed with the patient and family. After consideration of risks, benefits and other options for treatment, the patient has consented to  Procedure(s): RIGHT/LEFT HEART CATH AND CORONARY ANGIOGRAPHY (N/A) as a surgical intervention .  The patient's history has been reviewed, patient examined, no change in status, stable for surgery.  I have reviewed the patient's chart and labs.  Questions were answered to the patient's satisfaction.     Belva Crome III

## 2018-09-01 NOTE — Discharge Instructions (Signed)

## 2018-09-01 NOTE — Research (Signed)
PhDEV Informed Consent   Subject Name: Daniel Mendez  Subject met inclusion and exclusion criteria.  The informed consent form, study requirements and expectations were reviewed with the subject and questions and concerns were addressed prior to the signing of the consent form.  The subject verbalized understanding of the trail requirements.  The subject agreed to participate in the PnDEv trial and signed the informed consent.  The informed consent was obtained prior to performance of any protocol-specific procedures for the subject.  A copy of the signed informed consent was given to the subject and a copy was placed in the subject's medical record.  Hedrick,Tammy W 09/01/2018, [id number]

## 2018-09-09 ENCOUNTER — Other Ambulatory Visit: Payer: Self-pay | Admitting: Interventional Cardiology

## 2018-09-12 ENCOUNTER — Institutional Professional Consult (permissible substitution): Payer: Medicare Other | Admitting: Surgery

## 2018-09-12 ENCOUNTER — Other Ambulatory Visit: Payer: Self-pay | Admitting: *Deleted

## 2018-09-12 VITALS — BP 168/79 | HR 64 | Resp 20 | Ht 70.0 in | Wt 178.0 lb

## 2018-09-12 DIAGNOSIS — I350 Nonrheumatic aortic (valve) stenosis: Secondary | ICD-10-CM | POA: Diagnosis not present

## 2018-09-12 DIAGNOSIS — I2510 Atherosclerotic heart disease of native coronary artery without angina pectoris: Secondary | ICD-10-CM

## 2018-09-13 NOTE — Pre-Procedure Instructions (Signed)
Daniel Mendez  09/13/2018      [facility] - [place], [place] - [address], [address], [address] [place] [address] Phone: [phone] Fax: [phone]  [id number] - [place], [address] [place] [address] Phone: [phone] Fax: [phone]    Your procedure is scheduled on October 28th.  Report to [facility] Admitting at [place].M.  Call this number if you have problems the morning of surgery:  [phone]   Remember:  Do not eat or drink after midnight.    Take these medicines the morning of surgery with A SIP OF WATER   atorvastatin (LIPITOR)   cyclobenzaprine (FLEXERIL) (if needed)  metoprolol succinate (TOPROL-XL)  ranolazine (RANEXA)  7 days prior to surgery STOP taking any Aspirin(unless otherwise instructed by your surgeon), Aleve, Naproxen, Ibuprofen, Motrin, Advil, Goody's, BC's, all herbal medications, fish oil, and all vitamins    WHAT DO I DO ABOUT MY DIABETES MEDICATION?   [name] Kitchen Do not take oral diabetes medicines (pills) the morning of surgery: Januvia, Metformin, and Glimepiride.  . The day of surgery, do not take other diabetes injectables, including Byetta (exenatide), Bydureon (exenatide ER), Victoza (liraglutide), or Trulicity (dulaglutide).  . If your CBG is greater than 220 mg/dL, you may take  of your sliding scale (correction) dose of insulin.   How to Manage Your Diabetes Before and After Surgery  Why is it important to control my blood sugar before and after surgery? . Improving blood sugar levels before and after surgery helps healing and can limit problems. . A way of improving blood sugar control is eating a healthy diet by: o  Eating less sugar and carbohydrates o  Increasing activity/exercise o  Talking with your doctor about reaching your blood sugar goals . High blood sugars (greater than 180 mg/dL) can raise your risk of infections and  slow your recovery, so you will need to focus on controlling your diabetes during the weeks before surgery. . Make sure that the doctor who takes care of your diabetes knows about your planned surgery including the date and location.  How do I manage my blood sugar before surgery? . Check your blood sugar at least 4 times a day, starting 2 days before surgery, to make sure that the level is not too high or low. o Check your blood sugar the morning of your surgery when you wake up and every 2 hours until you get to the Short Stay unit. . If your blood sugar is less than 70 mg/dL, you will need to treat for low blood sugar: o Do not take insulin. o Treat a low blood sugar (less than 70 mg/dL) with  cup of clear juice (cranberry or apple), 4 glucose tablets, OR glucose gel. o Recheck blood sugar in 15 minutes after treatment (to make sure it is greater than 70 mg/dL). If your blood sugar is not greater than 70 mg/dL on recheck, call [phone] for further instructions. . Report your blood sugar to the short stay nurse when you get to Short Stay.  . If you are admitted to the hospital after surgery: o Your blood sugar will be checked by the staff and you will probably be given insulin after surgery (instead of oral diabetes medicines) to make sure you have good blood sugar levels. o The goal for blood sugar control after surgery is 80-180 mg/dL.    Do not wear jewelry.  Do not wear lotions, powders, or colognes, or deodorant.  Men may shave face and neck.  Do not bring valuables to the hospital.  [facility] is not responsible for any belongings or valuables.  Contacts, dentures or bridgework may not be worn into surgery.  Leave your suitcase in the car.  After surgery it may be brought to your room.  For patients admitted to the hospital, discharge time will be determined by your treatment team.  Patients discharged the day of surgery will not be allowed to drive home.    -  Preparing For Surgery  Before surgery, you can play an important role. Because skin is not sterile, your skin needs to be as free of germs as possible. You can reduce the number of germs on your skin by washing with CHG (chlorahexidine gluconate) Soap before surgery.  CHG is an antiseptic cleaner which kills germs and bonds with the skin to continue killing germs even after washing.    Oral Hygiene is also important to reduce your risk of infection.  Remember - BRUSH YOUR TEETH THE MORNING OF SURGERY WITH YOUR REGULAR TOOTHPASTE  Please do not use if you have an allergy to CHG or antibacterial soaps. If your skin becomes reddened/irritated stop using the CHG.  Do not shave (including legs and underarms) for at least 48 hours prior to first CHG shower. It is OK to shave your face.  Please follow these instructions carefully.   1. Shower the NIGHT BEFORE SURGERY and the MORNING OF SURGERY with CHG.   2. If you chose to wash your hair, wash your hair first as usual with your normal shampoo.  3. After you shampoo, rinse your hair and body thoroughly to remove the shampoo.  4. Use CHG as you would any other liquid soap. You can apply CHG directly to the skin and wash gently with a scrungie or a clean washcloth.   5. Apply the CHG Soap to your body ONLY FROM THE NECK DOWN.  Do not use on open wounds or open sores. Avoid contact with your eyes, ears, mouth and genitals (private parts). Wash Face and genitals (private parts)  with your normal soap.  6. Wash thoroughly, paying special attention to the area where your surgery will be performed.  7. Thoroughly rinse your body with warm water from the neck down.  8. DO NOT shower/wash with your normal soap after using and rinsing off the CHG Soap.  9. Pat yourself dry with a CLEAN TOWEL.  10. Wear CLEAN PAJAMAS to bed the night before surgery, wear comfortable clothes the morning of surgery  11. Place CLEAN SHEETS on your bed the night of your  first shower and DO NOT SLEEP WITH PETS.    Day of Surgery:  Do not apply any deodorants/lotions.  Please wear clean clothes to the hospital/surgery center.   Remember to brush your teeth WITH YOUR REGULAR TOOTHPASTE.    Please read over the following fact sheets that you were given.

## 2018-09-14 ENCOUNTER — Encounter: Payer: Self-pay | Admitting: Surgery

## 2018-09-14 ENCOUNTER — Other Ambulatory Visit: Payer: Self-pay

## 2018-09-14 ENCOUNTER — Encounter (HOSPITAL_COMMUNITY): Payer: Self-pay

## 2018-09-14 ENCOUNTER — Encounter (HOSPITAL_COMMUNITY)
Admission: RE | Admit: 2018-09-14 | Discharge: 2018-09-14 | Disposition: A | Discharge status: Home / Self Care | Payer: Medicare Other | Source: Ambulatory Visit | Attending: Surgery | Admitting: Surgery

## 2018-09-14 ENCOUNTER — Ambulatory Visit (HOSPITAL_COMMUNITY)
Admission: RE | Admit: 2018-09-14 | Discharge: 2018-09-14 | Disposition: A | Discharge status: Home / Self Care | Payer: Medicare Other | Source: Ambulatory Visit | Attending: Surgery | Admitting: Surgery

## 2018-09-14 DIAGNOSIS — I350 Nonrheumatic aortic (valve) stenosis: Secondary | ICD-10-CM

## 2018-09-14 DIAGNOSIS — E119 Type 2 diabetes mellitus without complications: Secondary | ICD-10-CM | POA: Diagnosis not present

## 2018-09-14 DIAGNOSIS — Z01818 Encounter for other preprocedural examination: Secondary | ICD-10-CM | POA: Diagnosis present

## 2018-09-14 DIAGNOSIS — I2510 Atherosclerotic heart disease of native coronary artery without angina pectoris: Secondary | ICD-10-CM

## 2018-09-14 DIAGNOSIS — I10 Essential (primary) hypertension: Secondary | ICD-10-CM | POA: Diagnosis not present

## 2018-09-14 DIAGNOSIS — R918 Other nonspecific abnormal finding of lung field: Secondary | ICD-10-CM | POA: Diagnosis not present

## 2018-09-14 DIAGNOSIS — E785 Hyperlipidemia, unspecified: Secondary | ICD-10-CM | POA: Insufficient documentation

## 2018-09-14 HISTORY — DX: Pneumonia, unspecified organism: J18.9

## 2018-09-14 HISTORY — DX: Personal history of urinary calculi: Z87.442

## 2018-09-14 LAB — PULMONARY FUNCTION TEST
DL/VA % pred: 75 %
DL/VA: 3.48 ml/min/mmHg/L
DLCO COR % PRED: 59 %
DLCO COR: 19.12 ml/min/mmHg
DLCO unc % pred: 55 %
DLCO unc: 18.01 ml/min/mmHg
FEF 25-75 POST: 3.03 L/s
FEF 25-75 PRE: 2.67 L/s
FEF2575-%CHANGE-POST: 13 %
FEF2575-%PRED-POST: 149 %
FEF2575-%PRED-PRE: 131 %
FEV1-%CHANGE-POST: 3 %
FEV1-%PRED-PRE: 99 %
FEV1-%Pred-Post: 102 %
FEV1-Post: 2.99 L
FEV1-Pre: 2.9 L
FEV1FVC-%CHANGE-POST: 3 %
FEV1FVC-%PRED-PRE: 109 %
FEV6-%CHANGE-POST: 0 %
FEV6-%PRED-POST: 97 %
FEV6-%Pred-Pre: 96 %
FEV6-PRE: 3.66 L
FEV6-Post: 3.69 L
FEV6FVC-%CHANGE-POST: 1 %
FEV6FVC-%Pred-Post: 107 %
FEV6FVC-%Pred-Pre: 105 %
FVC-%Change-Post: 0 %
FVC-%Pred-Post: 90 %
FVC-%Pred-Pre: 90 %
FVC-Post: 3.69 L
Post FEV1/FVC ratio: 81 %
Post FEV6/FVC ratio: 100 %
Pre FEV1/FVC ratio: 78 %
Pre FEV6/FVC Ratio: 99 %
RV % pred: 67 %
RV: 1.79 L
TLC % pred: 82 %
TLC: 5.85 L

## 2018-09-14 LAB — BLOOD GAS, ARTERIAL
Acid-base deficit: 0.1 mmol/L (ref 0.0–2.0)
Bicarbonate: 24.1 mmol/L (ref 20.0–28.0)
Drawn by: 470591
FIO2: 21
O2 Saturation: 96.9 %
PCO2 ART: 39.5 mmHg (ref 32.0–48.0)
PH ART: 7.402 (ref 7.350–7.450)
PO2 ART: 89.9 mmHg (ref 83.0–108.0)
Patient temperature: 98.6

## 2018-09-14 LAB — COMPREHENSIVE METABOLIC PANEL
ALK PHOS: 48 U/L (ref 38–126)
ALT: 25 U/L (ref 0–44)
AST: 23 U/L (ref 15–41)
Albumin: 3.8 g/dL (ref 3.5–5.0)
Anion gap: 10 (ref 5–15)
BUN: 19 mg/dL (ref 8–23)
CALCIUM: 10.4 mg/dL — AB (ref 8.9–10.3)
CO2: 17 mmol/L — AB (ref 22–32)
CREATININE: 1.17 mg/dL (ref 0.61–1.24)
Chloride: 111 mmol/L (ref 98–111)
GFR, EST NON AFRICAN AMERICAN: 57 mL/min — AB (ref >60)
Glucose, Bld: 66 mg/dL — ABNORMAL LOW (ref 70–99)
Potassium: 4.9 mmol/L (ref 3.5–5.1)
Sodium: 138 mmol/L (ref 135–145)
Total Bilirubin: 0.6 mg/dL (ref 0.3–1.2)
Total Protein: 6.5 g/dL (ref 6.5–8.1)

## 2018-09-14 LAB — URINALYSIS, ROUTINE W REFLEX MICROSCOPIC
BACTERIA UA: NONE SEEN
Bilirubin Urine: NEGATIVE
Glucose, UA: NEGATIVE mg/dL
Hgb urine dipstick: NEGATIVE
KETONES UR: NEGATIVE mg/dL
Leukocytes, UA: NEGATIVE
Nitrite: NEGATIVE
PROTEIN: NEGATIVE mg/dL
Specific Gravity, Urine: 1.024 (ref 1.005–1.030)
pH: 5 (ref 5.0–8.0)

## 2018-09-14 LAB — CBC
HCT: 42.1 % (ref 39.0–52.0)
HEMOGLOBIN: 12.5 g/dL — AB (ref 13.0–17.0)
MCH: 26 pg (ref 26.0–34.0)
MCHC: 29.7 g/dL — ABNORMAL LOW (ref 30.0–36.0)
MCV: 87.7 fL (ref 80.0–100.0)
NRBC: 0 % (ref 0.0–0.2)
Platelets: 206 10*3/uL (ref 150–400)
RBC: 4.8 MIL/uL (ref 4.22–5.81)
RDW: 14.6 % (ref 11.5–15.5)
WBC: 8.2 10*3/uL (ref 4.0–10.5)

## 2018-09-14 LAB — APTT: aPTT: 27 seconds (ref 24–36)

## 2018-09-14 LAB — HEMOGLOBIN A1C
HEMOGLOBIN A1C: 6.6 % — AB (ref 4.8–5.6)
MEAN PLASMA GLUCOSE: 142.72 mg/dL

## 2018-09-14 LAB — TYPE AND SCREEN
ABO/RH(D): O POS
ANTIBODY SCREEN: NEGATIVE

## 2018-09-14 LAB — SURGICAL PCR SCREEN
MRSA, PCR: NEGATIVE
Staphylococcus aureus: NEGATIVE

## 2018-09-14 LAB — PROTIME-INR
INR: 1.01
Prothrombin Time: 13.2 seconds (ref 11.4–15.2)

## 2018-09-14 LAB — GLUCOSE, CAPILLARY: Glucose-Capillary: 79 mg/dL (ref 70–99)

## 2018-09-14 MED ORDER — ALBUTEROL SULFATE (2.5 MG/3ML) 0.083% IN NEBU
2.5000 mg | INHALATION_SOLUTION | Freq: Once | RESPIRATORY_TRACT | Status: AC
  Administered 2018-09-14: 2.5 mg via RESPIRATORY_TRACT

## 2018-09-14 NOTE — Progress Notes (Signed)
Pre-op Cardiac Surgery  Carotid Findings:  Findings suggest 1-39% internal carotid artery stenosis bilaterally. Vertebral arteries are patent with antegrade flow.  Upper Extremity Right Left  Brachial Pressures 182-Brachial 161-Triphasic  Radial Waveforms Triphasic Triphasic  Ulnar Waveforms Triphasic Triphasic  Palmar Arch (Allen's Test) Within normal limits Within normal limits    Lower  Extremity Right Left  Dorsalis Pedis 97-Tripahsic 112-Triphasic  Anterior Tibial    Posterior Tibial 97-Biphasic 145-Triphasic  Ankle/Brachial Indices 0.76 0.80    Findings:   Right ABI is suggestive of moderate arterial insufficiency at rest. The left ABI is suggestive of mild arterial insufficiency at rest.  09/14/2018 2:00 PM Maudry Mayhew, MHA, RVT, RDCS, RDMS  Darlina Sicilian, RDCS

## 2018-09-14 NOTE — Pre-Procedure Instructions (Addendum)
Daniel Mendez  09/14/2018      Your procedure is scheduled on Monday,  October 28th.  Report to [facility] Admitting at 5:30 A.M.               Your surgery or procedure is scheduled for 7:30 AM   Call this number if you have problems the morning of surgery:  This is the number for the Pre- Surgical Desk.  [phone]   Remember:  Do not eat or drink after midnight, Sunday October 27.    Take these medicines the morning of surgery with A SIP OF WATER   atorvastatin (LIPITOR)   cyclobenzaprine (FLEXERIL) (if needed)  metoprolol succinate (TOPROL-XL)  ranolazine (RANEXA)  7 days prior to surgery STOP taking any Aspirin(unless otherwise instructed by your surgeon), Aleve, Naproxen, Ibuprofen, Motrin, Advil, Goody's, BC's, all herbal medications, fish oil, and all vitamins   WHAT DO I DO ABOUT MY DIABETES MEDICATION?  [name] Kitchen Do not take oral diabetes medicines (pills) the morning of surgery: Januvia, Metformin, and Glimepiride.  . The day of surgery, do not take other diabetes injectables, including Byetta (exenatide), Bydureon (exenatide ER), Victoza (liraglutide), or Trulicity (dulaglutide).  . If your CBG is greater than 220 mg/dL, you may take  of your sliding scale (correction) dose of insulin.   How to Manage Your Diabetes Before and After Surgery  Why is it important to control my blood sugar before and after surgery? . Improving blood sugar levels before and after surgery helps healing and can limit problems. . A way of improving blood sugar control is eating a healthy diet by: o  Eating less sugar and carbohydrates o  Increasing activity/exercise o  Talking with your doctor about reaching your blood sugar goals . High blood sugars (greater than 180 mg/dL) can raise your risk of infections and slow your recovery, so you will need to focus on controlling your diabetes during the weeks before surgery. . Make sure that the doctor who takes care of your  diabetes knows about your planned surgery including the date and location.  How do I manage my blood sugar before surgery? . Check your blood sugar at least 4 times a day, starting 2 days before surgery, to make sure that the level is not too high or low. o Check your blood sugar the morning of your surgery when you wake up and every 2 hours until you get to the Short Stay unit. . If your blood sugar is less than 70 mg/dL, you will need to treat for low blood sugar: o Do not take insulin. o Treat a low blood sugar (less than 70 mg/dL) with  cup of clear juice (cranberry or apple), 4 glucose tablets, OR glucose gel. o Recheck blood sugar in 15 minutes after treatment (to make sure it is greater than 70 mg/dL). If your blood sugar is not greater than 70 mg/dL on recheck, call [phone] for further instructions. . Report your blood sugar to the short stay nurse when you get to Short Stay.  . If you are admitted to the hospital after surgery: o Your blood sugar will be checked by the staff and you will probably be given insulin after surgery (instead of oral diabetes medicines) to make sure you have good blood sugar levels. o The goal for blood sugar control after surgery is 80-180 mg/dL.    [facility]- Preparing For Surgery  Before surgery, you can play an important role. Because skin  is not sterile, your skin needs to be as free of germs as possible. You can reduce the number of germs on your skin by washing with CHG (chlorahexidine gluconate) Soap before surgery.  CHG is an antiseptic cleaner which kills germs and bonds with the skin to continue killing germs even after washing.    Oral Hygiene is also important to reduce your risk of infection.  Remember - BRUSH YOUR TEETH THE MORNING OF SURGERY WITH YOUR REGULAR TOOTHPASTE  Please do not use if you have an allergy to CHG or antibacterial soaps. If your skin becomes reddened/irritated stop using the CHG.  Do not shave (including legs and  underarms) for at least 48 hours prior to first CHG shower. It is OK to shave your face.  Please follow these instructions carefully.   1. Shower the NIGHT BEFORE SURGERY and the MORNING OF SURGERY with CHG.   2. If you chose to wash your hair, wash your hair first as usual with your normal shampoo.  3. After you shampoo, rinse your hair and body thoroughly to remove the shampoo.  4. Use CHG as you would any other liquid soap. You can apply CHG directly to the skin and wash gently with a scrungie or a clean washcloth.   5. Apply the CHG Soap to your body ONLY FROM THE NECK DOWN.  Do not use on open wounds or open sores. Avoid contact with your eyes, ears, mouth and genitals (private parts). Wash Face and genitals (private parts)  with your normal soap.  6. Wash thoroughly, paying special attention to the area where your surgery will be performed.  7. Thoroughly rinse your body with warm water from the neck down.  8. DO NOT shower/wash with your normal soap after using and rinsing off the CHG Soap.  9. Pat yourself dry with a CLEAN TOWEL.  10. Wear CLEAN PAJAMAS to bed the night before surgery, wear comfortable clothes the morning of surgery  11. Place CLEAN SHEETS on your bed the night of your first shower and DO NOT SLEEP WITH PETS.  Day of Surgery:  Shower as written above  Do not apply any deodorants/lotions, powders or colognes.  Please wear clean clothes to the hospital/surgery center.   Remember to brush your teeth WITH YOUR REGULAR TOOTHPASTE.             Do not wear jewelry.  Men may shave face and neck.  Do not bring valuables to the hospital.  [facility] is not responsible for any belongings or valuables.  Contacts, dentures or bridgework may not be worn into surgery.  Leave your suitcase in the car.  After surgery it may be brought to your room.  For patients admitted to the hospital, discharge time will be determined by your treatment team.  Patients discharged  the day of surgery will not be allowed to drive home.    Please read over the following fact sheets that you were given.

## 2018-09-14 NOTE — Progress Notes (Signed)
Cardiothoracic Surgery Consultation  PCP is Seward Carol, MD Referring Provider is Belva Crome, MD  Chief Complaint  Patient presents with  . Aortic Stenosis    Surgical eval, Cardiac Cath 09/01/18, ECHO 08/14/18     HPI:  The patient is a 77 year old gentleman with a history of hypertension, hyperlipidemia, type 2 diabetes, cerebrovascular disease status post left carotid endarterectomy, coronary disease with known ostial/proximal occlusion of the right coronary artery by catheterization in 06/2017, and severe aortic stenosis.  2D echocardiogram in 07/2017 showed a mean gradient across aortic valve of 22 mmHg with a dimensionless index of 0.38 and a valve area of 1.18 cm.  His cardiac catheterization at that time showed chronic total occlusion of the RCA as mentioned above with collaterals from the left coronary system.  There were luminal irregularities in the proximal and mid LAD without significant stenosis.  The left circumflex had no significant disease.  He was continued on medical therapy for exertional angina.  A follow-up echocardiogram on 08/14/2018 shows progression of his aortic stenosis with a peak velocity of 4 m/s corresponding to a mean gradient of 39 mmHg with a dimensionless index of 0.3.  The aortic valve area was measured at 1.15 cm.  Left ventricular ejection fraction was 60 to 65%.  Cardiac catheterization was performed again on 09/01/2018.  The gradient across aortic valve was measured at 21 mmHg.  The chronic total occlusion of the right coronary artery was unchanged.  The LAD and left circumflex had nonobstructive disease.  The patient is here today with his wife.  He reports increasing exertional shortness of breath and chest tightness with activity.  He has not been able to walk as far as he was 6 months ago and has difficulty doing his yard work.  He denies any dizziness or syncope.  He has not had any orthopnea. Past Medical History:  Diagnosis Date  . Anginal  pain ([place])   . Arthritis    hand- right  . Coronary artery disease   . Diabetes ([place])    type 2  . GERD (gastroesophageal reflux disease)   . Heart murmur    discovered in 2015 - followed by Dr. Daneen Schick  . History of hiatal hernia   . History of kidney stones   . HTN (hypertension)   . Hypercalcemia   . Hyperglyceridemia   . Hyperlipidemia   . Mild aortic stenosis Oct 2014  . Peripheral vascular disease ([place])   . Pneumonia 07/28/2018  . Shortness of breath dyspnea    on exertion  . Sleep apnea    no cpap    Past Surgical History:  Procedure Laterality Date  . CATARACT EXTRACTION Bilateral   . COLONOSCOPY WITH PROPOFOL N/A 11/19/2014   Procedure: COLONOSCOPY WITH PROPOFOL;  Surgeon: Garlan Fair, MD;  Location: [facility];  Service: Endoscopy;  Laterality: N/A;  . dental implants     dental post implanted to hold dentures/partials in place  . ENDARTERECTOMY Left 03/10/2015   Procedure: Left Carotid ENDARTERECTOMY ;  Surgeon: Rosetta Posner, MD;  Location: [facility];  Service: Vascular;  Laterality: Left;  . FRACTURE SURGERY Left 1958   Left hand   . HERNIA REPAIR     Bilateral inguinal   . hernia repair b/l inguinal Bilateral   . knee arthroscopy left Left   . RIGHT/LEFT HEART CATH AND CORONARY ANGIOGRAPHY N/A 08/12/2017   Procedure: RIGHT/LEFT HEART CATH AND CORONARY ANGIOGRAPHY;  Surgeon: Belva Crome, MD;  Location: [facility];  Service: Cardiovascular;  Laterality: N/A;  . RIGHT/LEFT HEART CATH AND CORONARY ANGIOGRAPHY N/A 09/01/2018   Procedure: RIGHT/LEFT HEART CATH AND CORONARY ANGIOGRAPHY;  Surgeon: Belva Crome, MD;  Location: [facility];  Service: Cardiovascular;  Laterality: N/A;  . ROTATOR CUFF REPAIR    . VASECTOMY      Family History  Problem Relation Age of Onset  . CVA Mother   . Peripheral vascular disease Mother   . Heart disease Father   . Heart disease Brother   . Diabetes Brother   . Cancer Daughter     Social  History Social History   Tobacco Use  . Smoking status: Never Smoker  . Smokeless tobacco: Never Used  Substance Use Topics  . Alcohol use: Yes    Alcohol/week: 0.0 standard drinks    Comment: rarely  . Drug use: No    Current Outpatient Medications  Medication Sig Dispense Refill  . aspirin EC 81 MG tablet Take 81 mg by mouth at bedtime.    [name] Kitchen atorvastatin (LIPITOR) 20 MG tablet Take 20 mg by mouth daily at 12 noon.     . cyclobenzaprine (FLEXERIL) 5 MG tablet Take 2.5 mg by mouth daily as needed for muscle spasms.     [name] Kitchen glimepiride (AMARYL) 4 MG tablet Take 4 mg by mouth daily with breakfast.    . metFORMIN (GLUCOPHAGE) 500 MG tablet Take 1,000 mg by mouth 2 (two) times daily.     . metoprolol succinate (TOPROL-XL) 25 MG 24 hr tablet TAKE 1/2 TABLET BY MOUTH EVERY DAY. 45 tablet 3  . Omega-3 Fatty Acids (FISH OIL) 1000 MG CAPS Take 1,000 mg by mouth 2 (two) times daily.     [name] Kitchen omeprazole (PRILOSEC) 20 MG capsule Take 20 mg by mouth every other day. At noon    . ONE TOUCH ULTRA TEST test strip 1 each by Other route daily.     Glory Rosebush DELICA LANCETS 41L MISC 1 each by Other route daily.     . ranolazine (RANEXA) 1000 MG SR tablet Take 1 tablet (1,000 mg total) by mouth 2 (two) times daily. 180 tablet 3  . sitaGLIPtin (JANUVIA) 100 MG tablet Take 100 mg by mouth daily with lunch.     . tamsulosin (FLOMAX) 0.4 MG CAPS capsule Take 0.4 mg by mouth every evening.    [name] Kitchen telmisartan (MICARDIS) 80 MG tablet Take 80 mg by mouth daily with lunch.     . testosterone cypionate (DEPOTESTOSTERONE CYPIONATE) 200 MG/ML injection Inject 200 mg into the muscle every 14 (fourteen) days.     No current facility-administered medications for this visit.     No Known Allergies  Review of Systems  Constitutional: Positive for activity change and fatigue.  HENT: Positive for hearing loss. Negative for dental problem.        Wears dentures  Eyes: Negative.   Respiratory: Positive for shortness of  breath.   Cardiovascular: Positive for chest pain. Negative for palpitations and leg swelling.  Gastrointestinal: Negative.   Endocrine: Negative.   Genitourinary: Positive for frequency.       BPH  Musculoskeletal: Negative.   Skin: Negative.   Neurological: Negative for dizziness and syncope.  Hematological: Negative.   Psychiatric/Behavioral: Negative.     BP (!) 168/79   Pulse 64   Resp 20   Ht 5\' 10"  (1.778 m)   Wt 178 lb (80.7 kg)   SpO2 95% Comment: RA  BMI  25.54 kg/m  Physical Exam  Constitutional: He is oriented to person, place, and time. He appears well-developed and well-nourished. No distress.  HENT:  Head: Normocephalic and atraumatic.  Mouth/Throat: Oropharynx is clear and moist.  Eyes: Pupils are equal, round, and reactive to light. Conjunctivae and EOM are normal.  Neck: Normal range of motion. Neck supple. No JVD present. No thyromegaly present.  Cardiovascular: Normal rate and regular rhythm.  Murmur heard. 3/6 systolic murmur along the right sternal border  Pulmonary/Chest: Effort normal and breath sounds normal. No respiratory distress.  Abdominal: Soft. Bowel sounds are normal. He exhibits no distension.  Musculoskeletal: Normal range of motion. He exhibits no edema.  Lymphadenopathy:    He has no cervical adenopathy.  Neurological: He is alert and oriented to person, place, and time. He has normal strength. No cranial nerve deficit or sensory deficit.  Skin: Skin is warm and dry.  Psychiatric: He has a normal mood and affect.     Diagnostic Tests:        [facility]                        [address], [place] [address]                            [phone]  ------------------------------------------------------------------- Transthoracic Echocardiography  Patient:    Katie, Moch MR #:       [id number] Study Date: 08/14/2018 Gender:     M Age:        26 Height:     177.8 cm Weight:      79.8 kg BSA:        1.99 m^2 Pt. Status: Room:   ATTENDING    Belva Crome, MD  Livia Snellen     Belva Crome, MD  REFERRING    Belva Crome, MD  SONOGRAPHER  Cindy Hazy, RDCS  PERFORMING   [facility], Outpatient  cc:  ------------------------------------------------------------------- LV EF: 60% -   65%  ------------------------------------------------------------------- Indications:      I35.0 Aortic Stenosis.  ------------------------------------------------------------------- History:   PMH:  Acquired from the patient and from the patient&'s chart.  PMH:  Murmur. Coronary artery disease. Sleep apnea. Shortness of breath.  Risk factors:  Hypertension. Diabetes mellitus. Dyslipidemia.  ------------------------------------------------------------------- Study Conclusions  - Left ventricle: The cavity size was normal. Wall thickness was   increased in a pattern of mild LVH. Systolic function was normal.   The estimated ejection fraction was in the range of 60% to 65%.   Left ventricular diastolic function parameters were normal. - Aortic valve: Progressive AS since last study mean gradient has   increased from 26 mmHg to 39 mmHg. Valve area 1.1 but indexes to   .52 cm2. There was moderate to severe stenosis. - Left atrium: The atrium was moderately dilated. - Atrial septum: No defect or patent foramen ovale was identified.  ------------------------------------------------------------------- Study data:   Study status:  Routine.  Procedure:  The patient reported no pain pre or post test. Transthoracic echocardiography for left ventricular function evaluation, for right ventricular function evaluation, and for assessment of valvular function. Image quality was adequate.  Study completion:  There were no complications.          Transthoracic echocardiography.  M-mode,  complete 2D, spectral Doppler, and color Doppler.  Birthdate: Patient birthdate: 1941/05/19.   Age:  Patient is 77 yr old.  Sex: Gender: male.    BMI: 25.3 kg/m^2.  Blood pressure:     112/68 Patient status:  Outpatient.  Study date:  Study date: 08/14/2018. Study time: 03:05 PM.  Location:  [facility]  -------------------------------------------------------------------  ------------------------------------------------------------------- Left ventricle:  The cavity size was normal. Wall thickness was increased in a pattern of mild LVH. Systolic function was normal. The estimated ejection fraction was in the range of 60% to 65%. The transmitral flow pattern was normal. The deceleration time of the early transmitral flow velocity was normal. The pulmonary vein flow pattern was normal. The tissue Doppler parameters were normal. Left ventricular diastolic function parameters were normal.  ------------------------------------------------------------------- Aortic valve:  Progressive AS since last study mean gradient has increased from 26 mmHg to 39 mmHg. Valve area 1.1 but indexes to .52 cm2.  Trileaflet; moderately thickened, moderately calcified leaflets.  Doppler:   There was moderate to severe stenosis. VTI ratio of LVOT to aortic valve: 0.32. Valve area (VTI): 1.11 cm^2. Indexed valve area (VTI): 0.56 cm^2/m^2. Peak velocity ratio of LVOT to aortic valve: 0.3. Valve area (Vmax): 1.03 cm^2. Indexed valve area (Vmax): 0.52 cm^2/m^2. Mean velocity ratio of LVOT to aortic valve: 0.33. Valve area (Vmean): 1.15 cm^2. Indexed valve area (Vmean): 0.58 cm^2/m^2.    Mean gradient (S): 34 mm Hg. Peak gradient (S): 66 mm Hg.  ------------------------------------------------------------------- Aorta:  The aorta was normal, not dilated, and non-diseased.  ------------------------------------------------------------------- Mitral valve:   Mildly thickened leaflets .  Doppler:  There was trivial regurgitation.    Peak gradient (D): 4 mm  Hg.  ------------------------------------------------------------------- Left atrium:  The atrium was moderately dilated.  ------------------------------------------------------------------- Atrial septum:  No defect or patent foramen ovale was identified.   ------------------------------------------------------------------- Pulmonic valve:    Doppler:  There was mild regurgitation.  ------------------------------------------------------------------- Tricuspid valve:   Doppler:  There was mild regurgitation.  ------------------------------------------------------------------- Right atrium:  The atrium was normal in size.  ------------------------------------------------------------------- Systemic veins: Inferior vena cava: The vessel was normal in size. The respirophasic diameter changes were in the normal range (>= 50%), consistent with normal central venous pressure.  ------------------------------------------------------------------- Post procedure conclusions Ascending Aorta:  - The aorta was normal, not dilated, and non-diseased.  ------------------------------------------------------------------- Measurements   Left ventricle                           Value          Reference  LV ID, ED, PLAX chordal          (L)     38    mm       43 - 52  LV ID, ES, PLAX chordal          (L)     22    mm       23 - 38  LV fx shortening, PLAX chordal           42    %        >=29  LV PW thickness, ED                      14    mm       ----------  IVS/LV PW ratio, ED  0.93           <=1.3  Stroke volume, 2D                        89    ml       ----------  Stroke volume/bsa, 2D                    45    ml/m^2   ----------  LV e&', lateral                           8.7   cm/s     ----------  LV E/e&', lateral                         11.25          ----------  LV e&', medial                            6.53  cm/s     ----------  LV E/e&', medial                           14.99          ----------  LV e&', average                           7.62  cm/s     ----------  LV E/e&', average                         12.86          ----------    Ventricular septum                       Value          Reference  IVS thickness, ED                        13    mm       ----------    LVOT                                     Value          Reference  LVOT ID, S                               21    mm       ----------  LVOT area                                3.46  cm^2     ----------  LVOT ID                                  21    mm       ----------  LVOT peak velocity, S  120   cm/s     ----------  LVOT mean velocity, S                    90.8  cm/s     ----------  LVOT VTI, S                              25.6  cm       ----------  LVOT peak gradient, S                    6     mm Hg    ----------  Stroke volume (SV), LVOT DP              88.7  ml       ----------  Stroke index (SV/bsa), LVOT DP           44.5  ml/m^2   ----------    Aortic valve                             Value          Reference  Aortic valve peak velocity, S            405   cm/s     ----------  Aortic valve mean velocity, S            273   cm/s     ----------  Aortic valve VTI, S                      79.9  cm       ----------  Aortic mean gradient, S                  34    mm Hg    ----------  Aortic peak gradient, S                  66    mm Hg    ----------  VTI ratio, LVOT/AV                       0.32           ----------  Aortic valve area, VTI                   1.11  cm^2     ----------  Aortic valve area/bsa, VTI               0.56  cm^2/m^2 ----------  Velocity ratio, peak, LVOT/AV            0.3            ----------  Aortic valve area, peak velocity         1.03  cm^2     ----------  Aortic valve area/bsa, peak              0.52  cm^2/m^2 ----------  velocity  Velocity ratio, mean, LVOT/AV            0.33           ----------  Aortic valve area, mean  velocity         1.15  cm^2     ----------  Aortic valve area/bsa, mean  0.58  cm^2/m^2 ----------  velocity    Aorta                                    Value          Reference  Aortic root ID, ED                       36    mm       ----------  Ascending aorta ID, A-P, S               35    mm       ----------    Left atrium                              Value          Reference  LA ID, A-P, ES                           50    mm       ----------  LA ID/bsa, A-P                   (H)     2.51  cm/m^2   <=2.2  LA volume, S                             52.4  ml       ----------  LA volume/bsa, S                         26.3  ml/m^2   ----------  LA volume, ES, 1-p A4C                   45.2  ml       ----------  LA volume/bsa, ES, 1-p A4C               22.7  ml/m^2   ----------  LA volume, ES, 1-p A2C                   60.4  ml       ----------  LA volume/bsa, ES, 1-p A2C               30.3  ml/m^2   ----------    Mitral valve                             Value          Reference  Mitral E-wave peak velocity              97.9  cm/s     ----------  Mitral A-wave peak velocity              118   cm/s     ----------  Mitral deceleration time         (H)     268   ms       150 - 230  Mitral peak gradient, D                  4     mm Hg    ----------  Mitral E/A ratio, peak                   0.8            ----------    Right atrium                             Value          Reference  RA ID, S-I, ES, A4C                      47.6  mm       34 - 49  RA area, ES, A4C                         17.2  cm^2     8.3 - 19.5  RA volume, ES, A/L                       49.9  ml       ----------  RA volume/bsa, ES, A/L                   25    ml/m^2   ----------    Right ventricle                          Value          Reference  RV ID, minor axis, ED, A4C base          40    mm       ----------  TAPSE                                    23.8  mm       ----------  RV s&', lateral, S                         16.4  cm/s     ----------  Legend: (L)  and  (H)  mark values outside specified reference range.  ------------------------------------------------------------------- Prepared and Electronically Authenticated by  Jenkins Rouge, M.D. 2019-09-23T16:02:44   Physicians   Panel Physicians Referring Physician Case Authorizing Physician  Belva Crome, MD (Primary)    Procedures   RIGHT/LEFT HEART CATH AND CORONARY ANGIOGRAPHY  Conclusion    Calcific aortic stenosis, severe by echo criteria and mild to moderate by hemodynamic assessment.  By hemodynamic assessment, using a cardiac output of 5.4 L/min and gradient of 21 mmHg the valve area is 1.72 cm  Previously documented normal LV systolic function with EF greater than 60%.  Echo demonstrates mild to moderate concentric hypertrophy.  Coronary artery disease with ostial occlusion of the right coronary.  The right coronary is codominant.  The right coronary fills by left-to-right collaterals.  Widely patent left main  Diffuse atherosclerosis throughout the proximal to distal vessel with up to 40 to 50% mid vessel narrowing.  No focal high-grade obstruction is seen.  Circumflex contains eccentric 40% proximal stenosis.  RECOMMENDATIONS:   Despite discordance between hemodynamic and echo data, the valve has evidence of significant stenosis by echo, a peak velocity greater than 4 m/s by echo, angiography demonstrates a chronically occluded small to moderate size RCA, and the patient  is severely symptomatic from both angina and dyspnea.  Therefore, I will referred the patient for consideration of surgical aortic valve replacement and coronary bypass surgery.  I am somewhat concerned that there may be some other issue involved producing his symptoms.  I have personally reviewed the echocardiogram and it does not particularly appear to exhibit amyloid.  I cannot blame his symptoms on anything other than the combination of  CAD and moderately severe aortic stenosis. Recommend Aspirin 81mg  daily for moderate CAD.  Indications   Atherosclerosis of native coronary artery of native heart, angina presence unspecified [I25.10 (ICD-10-CM)]  Nonrheumatic aortic valve stenosis [I35.0 (ICD-10-CM)]  Procedural Details/Technique   Technical Details The right radial area was sterilely prepped and draped. Intravenous sedation with Versed and fentanyl was administered. 1% Xylocaine was infiltrated to achieve local analgesia. Using real-time vascular ultrasound, a double wall stick with an angiocath was utilized to obtain intra-arterial access. A VUS image was saved for the permanent record.The modified Seldinger technique was used to place a 22F " Slender" sheath in the right radial artery. Weight based heparin was administered. Coronary angiography was done using 5 F catheters. Right coronary angiography was performed with a JR4. Left ventricular hemodymic recordings and angiography was done using the JR 4 catheter and hand injection. Left coronary angiography was performed with a JL 3.5 cm.  Right heart catheterization was performed by exchanging a previously placed antecubital IV angio-cath for a 5 French Slender sheath. 1% Xylocaine was used to locally nesthetize the area around the IV site. The IV catheter was wired using an .018 guidewire. The modified Seldinger technique was used to place the 5 [place] sheath. Double glove technique was used to enhance sterility. After sheath insertion, right heart cath was performed using a 5 French balloon tipped catheter and fluoroscopic guidance. Pressures were recorded in each chamber and in the pulmonary capillary wedge position.. The main pulmonary artery O2 saturation was sampled.   Hemostasis was achieved using a pneumatic band.  During this procedure the patient is administered a total of Versed 1.5 mg and Fentanyl 25 mg to achieve and maintain moderate conscious sedation. The patient's  heart rate, blood pressure, and oxygen saturation are monitored continuously during the procedure. The period of conscious sedation is 35 minutes, of which I was present face-to-face 100% of this time.   Estimated blood loss <50 mL.  During this procedure the patient was administered the following to achieve and maintain moderate conscious sedation: Versed 1.5 mg, Fentanyl 25 mcg, while the patient's heart rate, blood pressure, and oxygen saturation were continuously monitored. The period of conscious sedation was 35 minutes, of which I was present face-to-face 100% of this time.  Coronary Findings   Diagnostic  Dominance: Co-dominant  Left Anterior Descending  Ost LAD to Mid LAD lesion 40% stenosed  Ost LAD to Mid LAD lesion is 40% stenosed.  Ramus Intermedius  Vessel is small.  Left Circumflex  Ost Cx to Prox Cx lesion 40% stenosed  Ost Cx to Prox Cx lesion is 40% stenosed.  Right Coronary Artery  Ost RCA to Prox RCA lesion 100% stenosed  Ost RCA to Prox RCA lesion is 100% stenosed.  First Right Posterolateral  Collaterals  1st RPLB filled by collaterals from 3rd Mrg.    Intervention   No interventions have been documented.  Right Heart   Right Heart Pressures Hemodynamic findings consistent with pulmonary hypertension. LV EDP is normal.  Left Heart   Left Ventricle LV end diastolic pressure  is normal.  Aortic Valve There is moderate aortic valve stenosis. There is no aortic valve regurgitation. The aortic valve is calcified. There is restricted aortic valve motion.  Coronary Diagrams   Diagnostic Diagram       Implants    No implant documentation for this case.  MERGE Images   Show images for CARDIAC CATHETERIZATION   Link to Procedure Log   Procedure Log    Hemo Data    Most Recent Value  Fick Cardiac Output 5.44 L/min  Fick Cardiac Output Index 2.75 (L/min)/BSA  Aortic Mean Gradient 17.26 mmHg  Aortic Peak Gradient 21 mmHg  Aortic Valve Area 1.72   Aortic Value Area Index 0.87 cm2/BSA  RA A Wave 8 mmHg  RA V Wave 6 mmHg  RA Mean 5 mmHg  RV Systolic Pressure 34 mmHg  RV Diastolic Pressure 1 mmHg  RV EDP 8 mmHg  PA Systolic Pressure 33 mmHg  PA Diastolic Pressure 9 mmHg  PA Mean 19 mmHg  PW A Wave 15 mmHg  PW V Wave 15 mmHg  PW Mean 11 mmHg  AO Systolic Pressure 428 mmHg  AO Diastolic Pressure 55 mmHg  AO Mean 77 mmHg  LV Systolic Pressure 768 mmHg  LV Diastolic Pressure 4 mmHg  LV EDP 15 mmHg  AOp Systolic Pressure 115 mmHg  AOp Diastolic Pressure 56 mmHg  AOp Mean Pressure 77 mmHg  LVp Systolic Pressure 726 mmHg  LVp Diastolic Pressure 4 mmHg  LVp EDP Pressure 15 mmHg  QP/QS 1  TPVR Index 6.91 HRUI  TSVR Index 28.01 HRUI  PVR SVR Ratio 0.11  TPVR/TSVR Ratio 0.25     Impression:  This 77 year old gentleman has severe, symptomatic aortic stenosis with New York Heart Association class Il symptoms of exertional fatigue and shortness of breath consistent with chronic diastolic congestive heart failure.  He also has severe single-vessel coronary artery disease with a chronic total occlusion of the ostial right coronary artery with filling of the distal vessel by collaterals from the left.  He has had recurrent exertional angina.  I agree that aortic valve replacement and coronary bypass graft surgery to the right coronary artery is the best treatment for this patient for improvement of his quality of life and to prevent further left ventricular deterioration.  I reviewed his catheterization and an echocardiogram images with him and his wife and answered their questions.  I discussed the operative procedure with the patient and family including alternatives, benefits and risks; including but not limited to bleeding, blood transfusion, infection, stroke, myocardial infarction, graft failure, heart block requiring a permanent pacemaker, organ dysfunction, and death.  [name] understands and agrees to proceed.  Given his  age of 83 I would plan to use a bioprosthetic valve.   Plan:  Aortic valve replacement using a bioprosthetic valve and coronary artery bypass graft surgery x1 to the right coronary artery on Monday, 09/18/2018.  I spent 60 minutes performing this consultation and > 50% of this time was spent face to face counseling and coordinating the care of this patient's severe symptomatic aortic stenosis and single-vessel coronary disease.  Gaye Pollack, MD [facility] [phone]

## 2018-09-17 MED ORDER — INSULIN REGULAR(HUMAN) IN NACL 100-0.9 UT/100ML-% IV SOLN
INTRAVENOUS | Status: AC
  Administered 2018-09-18: 2 [IU]/h via INTRAVENOUS
  Filled 2018-09-17: qty 100

## 2018-09-17 MED ORDER — POTASSIUM CHLORIDE 2 MEQ/ML IV SOLN
80.0000 meq | INTRAVENOUS | Status: DC
  Filled 2018-09-17: qty 40

## 2018-09-17 MED ORDER — PLASMA-LYTE 148 IV SOLN
INTRAVENOUS | Status: DC
  Filled 2018-09-17: qty 2.5

## 2018-09-17 MED ORDER — DOPAMINE-DEXTROSE 3.2-5 MG/ML-% IV SOLN
0.0000 ug/kg/min | INTRAVENOUS | Status: DC
  Filled 2018-09-17: qty 250

## 2018-09-17 MED ORDER — MAGNESIUM SULFATE 50 % IJ SOLN
40.0000 meq | INTRAMUSCULAR | Status: DC
  Filled 2018-09-17: qty 9.85

## 2018-09-17 MED ORDER — DEXMEDETOMIDINE HCL IN NACL 400 MCG/100ML IV SOLN
0.1000 ug/kg/h | INTRAVENOUS | Status: AC
  Administered 2018-09-18: .5 ug/kg/h via INTRAVENOUS
  Filled 2018-09-17: qty 100

## 2018-09-17 MED ORDER — NITROGLYCERIN IN D5W 200-5 MCG/ML-% IV SOLN
2.0000 ug/min | INTRAVENOUS | Status: AC
  Administered 2018-09-18: 16.6 ug/min via INTRAVENOUS
  Filled 2018-09-17: qty 250

## 2018-09-17 MED ORDER — SODIUM CHLORIDE 0.9 % IV SOLN
1.5000 g | INTRAVENOUS | Status: AC
  Administered 2018-09-18: 1.5 g via INTRAVENOUS
  Filled 2018-09-17: qty 1.5

## 2018-09-17 MED ORDER — PHENYLEPHRINE HCL-NACL 20-0.9 MG/250ML-% IV SOLN
30.0000 ug/min | INTRAVENOUS | Status: AC
  Administered 2018-09-18: 30 ug/min via INTRAVENOUS
  Filled 2018-09-17: qty 250

## 2018-09-17 MED ORDER — TRANEXAMIC ACID (OHS) BOLUS VIA INFUSION
15.0000 mg/kg | INTRAVENOUS | Status: AC
  Administered 2018-09-18: 1215 mg via INTRAVENOUS
  Filled 2018-09-17: qty 1215

## 2018-09-17 MED ORDER — SODIUM CHLORIDE 0.9 % IV SOLN
750.0000 mg | INTRAVENOUS | Status: AC
  Administered 2018-09-18: 750 mg via INTRAVENOUS
  Filled 2018-09-17 (×2): qty 750

## 2018-09-17 MED ORDER — TRANEXAMIC ACID (OHS) PUMP PRIME SOLUTION
2.0000 mg/kg | INTRAVENOUS | Status: DC
  Filled 2018-09-17: qty 1.62

## 2018-09-17 MED ORDER — TRANEXAMIC ACID 1000 MG/10ML IV SOLN
1.5000 mg/kg/h | INTRAVENOUS | Status: AC
  Administered 2018-09-18: 1.5 mg/kg/h via INTRAVENOUS
  Filled 2018-09-17: qty 25

## 2018-09-17 MED ORDER — EPINEPHRINE PF 1 MG/ML IJ SOLN
0.0000 ug/min | INTRAVENOUS | Status: DC
  Filled 2018-09-17: qty 4

## 2018-09-17 MED ORDER — SODIUM CHLORIDE 0.9 % IV SOLN
INTRAVENOUS | Status: DC
  Filled 2018-09-17: qty 30

## 2018-09-17 MED ORDER — VANCOMYCIN HCL 10 G IV SOLR
1250.0000 mg | INTRAVENOUS | Status: AC
  Administered 2018-09-18: 1250 mg via INTRAVENOUS
  Filled 2018-09-17 (×2): qty 1250

## 2018-09-17 MED ORDER — MILRINONE LACTATE IN DEXTROSE 20-5 MG/100ML-% IV SOLN
0.3000 ug/kg/min | INTRAVENOUS | Status: DC
  Filled 2018-09-17: qty 100

## 2018-09-18 ENCOUNTER — Inpatient Hospital Stay (HOSPITAL_COMMUNITY)
Admission: RE | Admit: 2018-09-18 | Discharge: 2018-09-23 | DRG: 220 | Disposition: A | Discharge status: Home / Self Care | Payer: Medicare Other | Attending: Surgery | Admitting: Surgery

## 2018-09-18 ENCOUNTER — Inpatient Hospital Stay (HOSPITAL_COMMUNITY): Payer: Medicare Other

## 2018-09-18 ENCOUNTER — Encounter (HOSPITAL_COMMUNITY): Payer: Self-pay | Admitting: Surgery

## 2018-09-18 ENCOUNTER — Inpatient Hospital Stay (HOSPITAL_COMMUNITY): Payer: Medicare Other | Admitting: Certified Registered Nurse Anesthetist

## 2018-09-18 ENCOUNTER — Other Ambulatory Visit: Payer: Self-pay

## 2018-09-18 ENCOUNTER — Inpatient Hospital Stay (HOSPITAL_COMMUNITY)
Admission: RE | Disposition: A | Discharge status: Home / Self Care | Payer: Self-pay | Source: Home / Self Care | Attending: Surgery

## 2018-09-18 DIAGNOSIS — K5900 Constipation, unspecified: Secondary | ICD-10-CM | POA: Diagnosis not present

## 2018-09-18 DIAGNOSIS — G4730 Sleep apnea, unspecified: Secondary | ICD-10-CM | POA: Diagnosis present

## 2018-09-18 DIAGNOSIS — Z7984 Long term (current) use of oral hypoglycemic drugs: Secondary | ICD-10-CM | POA: Diagnosis not present

## 2018-09-18 DIAGNOSIS — Z9841 Cataract extraction status, right eye: Secondary | ICD-10-CM

## 2018-09-18 DIAGNOSIS — I2582 Chronic total occlusion of coronary artery: Secondary | ICD-10-CM | POA: Diagnosis present

## 2018-09-18 DIAGNOSIS — Z833 Family history of diabetes mellitus: Secondary | ICD-10-CM | POA: Diagnosis not present

## 2018-09-18 DIAGNOSIS — I110 Hypertensive heart disease with heart failure: Secondary | ICD-10-CM | POA: Diagnosis present

## 2018-09-18 DIAGNOSIS — I272 Pulmonary hypertension, unspecified: Secondary | ICD-10-CM | POA: Diagnosis present

## 2018-09-18 DIAGNOSIS — Z8249 Family history of ischemic heart disease and other diseases of the circulatory system: Secondary | ICD-10-CM | POA: Diagnosis not present

## 2018-09-18 DIAGNOSIS — I25119 Atherosclerotic heart disease of native coronary artery with unspecified angina pectoris: Secondary | ICD-10-CM | POA: Diagnosis present

## 2018-09-18 DIAGNOSIS — I2510 Atherosclerotic heart disease of native coronary artery without angina pectoris: Secondary | ICD-10-CM

## 2018-09-18 DIAGNOSIS — E781 Pure hyperglyceridemia: Secondary | ICD-10-CM | POA: Diagnosis present

## 2018-09-18 DIAGNOSIS — I5032 Chronic diastolic (congestive) heart failure: Secondary | ICD-10-CM | POA: Diagnosis present

## 2018-09-18 DIAGNOSIS — I440 Atrioventricular block, first degree: Secondary | ICD-10-CM | POA: Diagnosis not present

## 2018-09-18 DIAGNOSIS — R011 Cardiac murmur, unspecified: Secondary | ICD-10-CM | POA: Diagnosis present

## 2018-09-18 DIAGNOSIS — E785 Hyperlipidemia, unspecified: Secondary | ICD-10-CM | POA: Diagnosis present

## 2018-09-18 DIAGNOSIS — I4891 Unspecified atrial fibrillation: Secondary | ICD-10-CM | POA: Diagnosis not present

## 2018-09-18 DIAGNOSIS — E1151 Type 2 diabetes mellitus with diabetic peripheral angiopathy without gangrene: Secondary | ICD-10-CM | POA: Diagnosis present

## 2018-09-18 DIAGNOSIS — D7582 Heparin induced thrombocytopenia (HIT): Secondary | ICD-10-CM | POA: Diagnosis not present

## 2018-09-18 DIAGNOSIS — T45515A Adverse effect of anticoagulants, initial encounter: Secondary | ICD-10-CM | POA: Diagnosis not present

## 2018-09-18 DIAGNOSIS — M19041 Primary osteoarthritis, right hand: Secondary | ICD-10-CM | POA: Diagnosis present

## 2018-09-18 DIAGNOSIS — Z953 Presence of xenogenic heart valve: Secondary | ICD-10-CM

## 2018-09-18 DIAGNOSIS — K219 Gastro-esophageal reflux disease without esophagitis: Secondary | ICD-10-CM | POA: Diagnosis present

## 2018-09-18 DIAGNOSIS — D62 Acute posthemorrhagic anemia: Secondary | ICD-10-CM | POA: Diagnosis not present

## 2018-09-18 DIAGNOSIS — Z952 Presence of prosthetic heart valve: Secondary | ICD-10-CM

## 2018-09-18 DIAGNOSIS — Z9842 Cataract extraction status, left eye: Secondary | ICD-10-CM

## 2018-09-18 DIAGNOSIS — Z87442 Personal history of urinary calculi: Secondary | ICD-10-CM

## 2018-09-18 DIAGNOSIS — I350 Nonrheumatic aortic (valve) stenosis: Secondary | ICD-10-CM

## 2018-09-18 DIAGNOSIS — Z7982 Long term (current) use of aspirin: Secondary | ICD-10-CM

## 2018-09-18 DIAGNOSIS — Z951 Presence of aortocoronary bypass graft: Secondary | ICD-10-CM

## 2018-09-18 DIAGNOSIS — I25810 Atherosclerosis of coronary artery bypass graft(s) without angina pectoris: Secondary | ICD-10-CM

## 2018-09-18 DIAGNOSIS — Z823 Family history of stroke: Secondary | ICD-10-CM

## 2018-09-18 HISTORY — PX: TEE WITHOUT CARDIOVERSION: SHX5443

## 2018-09-18 HISTORY — PX: CORONARY ARTERY BYPASS GRAFT: SHX141

## 2018-09-18 HISTORY — PX: AORTIC VALVE REPLACEMENT: SHX41

## 2018-09-18 LAB — CBC
HCT: 31.6 % — ABNORMAL LOW (ref 39.0–52.0)
HCT: 33.5 % — ABNORMAL LOW (ref 39.0–52.0)
HEMOGLOBIN: 10.4 g/dL — AB (ref 13.0–17.0)
Hemoglobin: 9.9 g/dL — ABNORMAL LOW (ref 13.0–17.0)
MCH: 26.9 pg (ref 26.0–34.0)
MCH: 26.7 pg (ref 26.0–34.0)
MCHC: 31.3 g/dL (ref 30.0–36.0)
MCHC: 31 g/dL (ref 30.0–36.0)
MCV: 85.9 fL (ref 80.0–100.0)
MCV: 85.9 fL (ref 80.0–100.0)
PLATELETS: 125 10*3/uL — AB (ref 150–400)
Platelets: 115 10*3/uL — ABNORMAL LOW (ref 150–400)
RBC: 3.68 MIL/uL — AB (ref 4.22–5.81)
RBC: 3.9 MIL/uL — ABNORMAL LOW (ref 4.22–5.81)
RDW: 14.7 % (ref 11.5–15.5)
RDW: 15 % (ref 11.5–15.5)
WBC: 11.4 10*3/uL — ABNORMAL HIGH (ref 4.0–10.5)
WBC: 13.8 10*3/uL — ABNORMAL HIGH (ref 4.0–10.5)
nRBC: 0 % (ref 0.0–0.2)
nRBC: 0 % (ref 0.0–0.2)

## 2018-09-18 LAB — POCT I-STAT 3, ART BLOOD GAS (G3+)
Acid-Base Excess: 2 mmol/L (ref 0.0–2.0)
Acid-base deficit: 1 mmol/L (ref 0.0–2.0)
Acid-base deficit: 3 mmol/L — ABNORMAL HIGH (ref 0.0–2.0)
Acid-base deficit: 3 mmol/L — ABNORMAL HIGH (ref 0.0–2.0)
BICARBONATE: 27.6 mmol/L (ref 20.0–28.0)
BICARBONATE: 23.4 mmol/L (ref 20.0–28.0)
Bicarbonate: 24.8 mmol/L (ref 20.0–28.0)
Bicarbonate: 22.1 mmol/L (ref 20.0–28.0)
O2 SAT: 99 %
O2 SAT: 94 %
O2 Saturation: 100 %
O2 Saturation: 96 %
PCO2 ART: 40.2 mmHg (ref 32.0–48.0)
PCO2 ART: 45.2 mmHg (ref 32.0–48.0)
PH ART: 7.348 — AB (ref 7.350–7.450)
PO2 ART: 86 mmHg (ref 83.0–108.0)
PO2 ART: 75 mmHg — AB (ref 83.0–108.0)
Patient temperature: 36.1
TCO2: 29 mmol/L (ref 22–32)
TCO2: 26 mmol/L (ref 22–32)
TCO2: 23 mmol/L (ref 22–32)
TCO2: 25 mmol/L (ref 22–32)
pCO2 arterial: 47.3 mmHg (ref 32.0–48.0)
pCO2 arterial: 43.6 mmHg (ref 32.0–48.0)
pH, Arterial: 7.374 (ref 7.350–7.450)
pH, Arterial: 7.358 (ref 7.350–7.450)
pH, Arterial: 7.323 — ABNORMAL LOW (ref 7.350–7.450)
pO2, Arterial: 325 mmHg — ABNORMAL HIGH (ref 83.0–108.0)
pO2, Arterial: 119 mmHg — ABNORMAL HIGH (ref 83.0–108.0)

## 2018-09-18 LAB — POCT I-STAT, CHEM 8
BUN: 15 mg/dL (ref 8–23)
BUN: 14 mg/dL (ref 8–23)
BUN: 14 mg/dL (ref 8–23)
BUN: 14 mg/dL (ref 8–23)
BUN: 14 mg/dL (ref 8–23)
BUN: 15 mg/dL (ref 8–23)
BUN: 13 mg/dL (ref 8–23)
CALCIUM ION: 1.37 mmol/L (ref 1.15–1.40)
CALCIUM ION: 1.36 mmol/L (ref 1.15–1.40)
CALCIUM ION: 1.26 mmol/L (ref 1.15–1.40)
CHLORIDE: 105 mmol/L (ref 98–111)
CREATININE: 1.2 mg/dL (ref 0.61–1.24)
CREATININE: 0.9 mg/dL (ref 0.61–1.24)
CREATININE: 1.1 mg/dL (ref 0.61–1.24)
CREATININE: 1 mg/dL (ref 0.61–1.24)
Calcium, Ion: 1.17 mmol/L (ref 1.15–1.40)
Calcium, Ion: 1.23 mmol/L (ref 1.15–1.40)
Calcium, Ion: 1.23 mmol/L (ref 1.15–1.40)
Calcium, Ion: 1.22 mmol/L (ref 1.15–1.40)
Chloride: 104 mmol/L (ref 98–111)
Chloride: 101 mmol/L (ref 98–111)
Chloride: 104 mmol/L (ref 98–111)
Chloride: 104 mmol/L (ref 98–111)
Chloride: 104 mmol/L (ref 98–111)
Chloride: 103 mmol/L (ref 98–111)
Creatinine, Ser: 1 mg/dL (ref 0.61–1.24)
Creatinine, Ser: 1 mg/dL (ref 0.61–1.24)
Creatinine, Ser: 1 mg/dL (ref 0.61–1.24)
GLUCOSE: 168 mg/dL — AB (ref 70–99)
GLUCOSE: 179 mg/dL — AB (ref 70–99)
Glucose, Bld: 211 mg/dL — ABNORMAL HIGH (ref 70–99)
Glucose, Bld: 181 mg/dL — ABNORMAL HIGH (ref 70–99)
Glucose, Bld: 181 mg/dL — ABNORMAL HIGH (ref 70–99)
Glucose, Bld: 185 mg/dL — ABNORMAL HIGH (ref 70–99)
Glucose, Bld: 141 mg/dL — ABNORMAL HIGH (ref 70–99)
HCT: 31 % — ABNORMAL LOW (ref 39.0–52.0)
HCT: 25 % — ABNORMAL LOW (ref 39.0–52.0)
HCT: 28 % — ABNORMAL LOW (ref 39.0–52.0)
HEMATOCRIT: 26 % — AB (ref 39.0–52.0)
HEMATOCRIT: 29 % — AB (ref 39.0–52.0)
HEMATOCRIT: 25 % — AB (ref 39.0–52.0)
HEMATOCRIT: 30 % — AB (ref 39.0–52.0)
HEMOGLOBIN: 8.5 g/dL — AB (ref 13.0–17.0)
HEMOGLOBIN: 9.9 g/dL — AB (ref 13.0–17.0)
HEMOGLOBIN: 8.5 g/dL — AB (ref 13.0–17.0)
Hemoglobin: 10.5 g/dL — ABNORMAL LOW (ref 13.0–17.0)
Hemoglobin: 9.5 g/dL — ABNORMAL LOW (ref 13.0–17.0)
Hemoglobin: 8.8 g/dL — ABNORMAL LOW (ref 13.0–17.0)
Hemoglobin: 10.2 g/dL — ABNORMAL LOW (ref 13.0–17.0)
POTASSIUM: 5.6 mmol/L — AB (ref 3.5–5.1)
POTASSIUM: 4.6 mmol/L (ref 3.5–5.1)
Potassium: 4.9 mmol/L (ref 3.5–5.1)
Potassium: 5 mmol/L (ref 3.5–5.1)
Potassium: 5.3 mmol/L — ABNORMAL HIGH (ref 3.5–5.1)
Potassium: 5.9 mmol/L — ABNORMAL HIGH (ref 3.5–5.1)
Potassium: 4.7 mmol/L (ref 3.5–5.1)
SODIUM: 137 mmol/L (ref 135–145)
SODIUM: 138 mmol/L (ref 135–145)
Sodium: 138 mmol/L (ref 135–145)
Sodium: 137 mmol/L (ref 135–145)
Sodium: 137 mmol/L (ref 135–145)
Sodium: 137 mmol/L (ref 135–145)
Sodium: 137 mmol/L (ref 135–145)
TCO2: 25 mmol/L (ref 22–32)
TCO2: 28 mmol/L (ref 22–32)
TCO2: 29 mmol/L (ref 22–32)
TCO2: 27 mmol/L (ref 22–32)
TCO2: 26 mmol/L (ref 22–32)
TCO2: 27 mmol/L (ref 22–32)
TCO2: 24 mmol/L (ref 22–32)

## 2018-09-18 LAB — MAGNESIUM: MAGNESIUM: 3.1 mg/dL — AB (ref 1.7–2.4)

## 2018-09-18 LAB — CREATININE, SERUM
Creatinine, Ser: 1.01 mg/dL (ref 0.61–1.24)
GFR calc Af Amer: >60 mL/min (ref >60)
GFR calc non Af Amer: >60 mL/min (ref >60)

## 2018-09-18 LAB — GLUCOSE, CAPILLARY
GLUCOSE-CAPILLARY: 108 mg/dL — AB (ref 70–99)
GLUCOSE-CAPILLARY: 129 mg/dL — AB (ref 70–99)
Glucose-Capillary: 149 mg/dL — ABNORMAL HIGH (ref 70–99)
Glucose-Capillary: 118 mg/dL — ABNORMAL HIGH (ref 70–99)
Glucose-Capillary: 148 mg/dL — ABNORMAL HIGH (ref 70–99)
Glucose-Capillary: 141 mg/dL — ABNORMAL HIGH (ref 70–99)
Glucose-Capillary: 144 mg/dL — ABNORMAL HIGH (ref 70–99)
Glucose-Capillary: 107 mg/dL — ABNORMAL HIGH (ref 70–99)
Glucose-Capillary: 137 mg/dL — ABNORMAL HIGH (ref 70–99)
Glucose-Capillary: 106 mg/dL — ABNORMAL HIGH (ref 70–99)

## 2018-09-18 LAB — HEMOGLOBIN AND HEMATOCRIT, BLOOD
HCT: 28.3 % — ABNORMAL LOW (ref 39.0–52.0)
Hemoglobin: 8.7 g/dL — ABNORMAL LOW (ref 13.0–17.0)

## 2018-09-18 LAB — PROTIME-INR
INR: 1.3
Prothrombin Time: 16 seconds — ABNORMAL HIGH (ref 11.4–15.2)

## 2018-09-18 LAB — POCT I-STAT 4, (NA,K, GLUC, HGB,HCT)
GLUCOSE: 129 mg/dL — AB (ref 70–99)
HEMATOCRIT: 28 % — AB (ref 39.0–52.0)
Hemoglobin: 9.5 g/dL — ABNORMAL LOW (ref 13.0–17.0)
Potassium: 4.2 mmol/L (ref 3.5–5.1)
SODIUM: 139 mmol/L (ref 135–145)

## 2018-09-18 LAB — APTT: aPTT: 30 seconds (ref 24–36)

## 2018-09-18 LAB — PLATELET COUNT: PLATELETS: 126 10*3/uL — AB (ref 150–400)

## 2018-09-18 SURGERY — CORONARY ARTERY BYPASS GRAFTING (CABG)
Anesthesia: General | Site: Chest

## 2018-09-18 MED ORDER — ASPIRIN 81 MG PO CHEW: 324.0000 mg | CHEWABLE_TABLET | Freq: Every day | ORAL | Status: DC

## 2018-09-18 MED ORDER — FAMOTIDINE IN NACL 20-0.9 MG/50ML-% IV SOLN
20.0000 mg | Freq: Two times a day (BID) | INTRAVENOUS | Status: AC
  Administered 2018-09-18: 20 mg via INTRAVENOUS

## 2018-09-18 MED ORDER — ONDANSETRON HCL 4 MG/2ML IJ SOLN
INTRAMUSCULAR | Status: AC
  Filled 2018-09-18: qty 2

## 2018-09-18 MED ORDER — DEXMEDETOMIDINE HCL IN NACL 200 MCG/50ML IV SOLN
0.0000 ug/kg/h | INTRAVENOUS | Status: DC
  Filled 2018-09-18: qty 50

## 2018-09-18 MED ORDER — NITROGLYCERIN IN D5W 200-5 MCG/ML-% IV SOLN
0.0000 ug/min | INTRAVENOUS | Status: DC
  Administered 2018-09-18: 5 ug/min via INTRAVENOUS
  Filled 2018-09-18: qty 250

## 2018-09-18 MED ORDER — DEXAMETHASONE SODIUM PHOSPHATE 10 MG/ML IJ SOLN
INTRAMUSCULAR | Status: AC
  Filled 2018-09-18: qty 1

## 2018-09-18 MED ORDER — ACETAMINOPHEN 650 MG RE SUPP: 650.0000 mg | Freq: Once | RECTAL | Status: AC

## 2018-09-18 MED ORDER — NITROGLYCERIN 0.2 MG/ML ON CALL CATH LAB
INTRAVENOUS | Status: DC | PRN
  Administered 2018-09-18: 20 ug via INTRAVENOUS

## 2018-09-18 MED ORDER — PHENYLEPHRINE 40 MCG/ML (10ML) SYRINGE FOR IV PUSH (FOR BLOOD PRESSURE SUPPORT)
PREFILLED_SYRINGE | INTRAVENOUS | Status: AC
  Filled 2018-09-18: qty 10

## 2018-09-18 MED ORDER — LACTATED RINGERS IV SOLN
INTRAVENOUS | Status: DC | PRN
  Administered 2018-09-18 (×2): via INTRAVENOUS

## 2018-09-18 MED ORDER — ORAL CARE MOUTH RINSE
15.0000 mL | Freq: Two times a day (BID) | OROMUCOSAL | Status: DC
  Administered 2018-09-18 – 2018-09-19 (×2): 15 mL via OROMUCOSAL

## 2018-09-18 MED ORDER — PLASMA-LYTE 148 IV SOLN
INTRAVENOUS | Status: DC | PRN
  Administered 2018-09-18: 500 mL via INTRAVASCULAR

## 2018-09-18 MED ORDER — METOPROLOL TARTRATE 5 MG/5ML IV SOLN: 2.5000 mg | INTRAVENOUS | Status: DC | PRN

## 2018-09-18 MED ORDER — SODIUM CHLORIDE 0.9 % IV SOLN
INTRAVENOUS | Status: DC
  Administered 2018-09-18: 14:00:00 via INTRAVENOUS

## 2018-09-18 MED ORDER — ACETAMINOPHEN 160 MG/5ML PO SOLN
650.0000 mg | Freq: Once | ORAL | Status: AC
  Administered 2018-09-18: 650 mg

## 2018-09-18 MED ORDER — SODIUM CHLORIDE 0.45 % IV SOLN
INTRAVENOUS | Status: DC | PRN
  Administered 2018-09-18 – 2018-09-19 (×2): via INTRAVENOUS

## 2018-09-18 MED ORDER — MORPHINE SULFATE (PF) 2 MG/ML IV SOLN
2.0000 mg | INTRAVENOUS | Status: DC | PRN
  Administered 2018-09-19: 2 mg via INTRAVENOUS
  Filled 2018-09-18: qty 2
  Filled 2018-09-18: qty 1

## 2018-09-18 MED ORDER — METOPROLOL TARTRATE 12.5 MG HALF TABLET
12.5000 mg | ORAL_TABLET | Freq: Two times a day (BID) | ORAL | Status: DC
  Administered 2018-09-19 (×2): 12.5 mg via ORAL
  Filled 2018-09-18 (×2): qty 1

## 2018-09-18 MED ORDER — METOPROLOL TARTRATE 12.5 MG HALF TABLET: 12.5000 mg | ORAL_TABLET | Freq: Once | ORAL | Status: DC

## 2018-09-18 MED ORDER — FENTANYL CITRATE (PF) 250 MCG/5ML IJ SOLN
INTRAMUSCULAR | Status: AC
  Filled 2018-09-18: qty 10

## 2018-09-18 MED ORDER — PHENYLEPHRINE 40 MCG/ML (10ML) SYRINGE FOR IV PUSH (FOR BLOOD PRESSURE SUPPORT)
PREFILLED_SYRINGE | INTRAVENOUS | Status: DC | PRN
  Administered 2018-09-18: 40 ug via INTRAVENOUS
  Administered 2018-09-18: 120 ug via INTRAVENOUS
  Administered 2018-09-18: 80 ug via INTRAVENOUS
  Administered 2018-09-18 (×2): 120 ug via INTRAVENOUS
  Administered 2018-09-18: 40 ug via INTRAVENOUS
  Administered 2018-09-18 (×2): 120 ug via INTRAVENOUS

## 2018-09-18 MED ORDER — INSULIN REGULAR BOLUS VIA INFUSION
0.0000 [IU] | Freq: Three times a day (TID) | INTRAVENOUS | Status: DC
  Filled 2018-09-18: qty 10

## 2018-09-18 MED ORDER — FENTANYL CITRATE (PF) 250 MCG/5ML IJ SOLN
INTRAMUSCULAR | Status: DC | PRN
  Administered 2018-09-18: 100 ug via INTRAVENOUS
  Administered 2018-09-18 (×2): 50 ug via INTRAVENOUS
  Administered 2018-09-18: 750 ug via INTRAVENOUS

## 2018-09-18 MED ORDER — ATORVASTATIN CALCIUM 20 MG PO TABS
20.0000 mg | ORAL_TABLET | Freq: Every day | ORAL | Status: DC
  Administered 2018-09-19 – 2018-09-21 (×3): 20 mg via ORAL
  Filled 2018-09-18 (×3): qty 1

## 2018-09-18 MED ORDER — MAGNESIUM SULFATE 4 GM/100ML IV SOLN
4.0000 g | Freq: Once | INTRAVENOUS | Status: AC
  Administered 2018-09-18: 4 g via INTRAVENOUS
  Filled 2018-09-18: qty 100

## 2018-09-18 MED ORDER — MIDAZOLAM HCL 10 MG/2ML IJ SOLN
INTRAMUSCULAR | Status: AC
  Filled 2018-09-18: qty 2

## 2018-09-18 MED ORDER — SODIUM CHLORIDE 0.9% FLUSH: 3.0000 mL | Freq: Two times a day (BID) | INTRAVENOUS | Status: DC

## 2018-09-18 MED ORDER — ROCURONIUM BROMIDE 50 MG/5ML IV SOSY
PREFILLED_SYRINGE | INTRAVENOUS | Status: AC
  Filled 2018-09-18: qty 10

## 2018-09-18 MED ORDER — THROMBIN 5000 UNITS EX SOLR
CUTANEOUS | Status: AC
  Filled 2018-09-18: qty 15000

## 2018-09-18 MED ORDER — LIDOCAINE 2% (20 MG/ML) 5 ML SYRINGE
INTRAMUSCULAR | Status: AC
  Filled 2018-09-18: qty 5

## 2018-09-18 MED ORDER — PROPOFOL 10 MG/ML IV BOLUS
INTRAVENOUS | Status: DC | PRN
  Administered 2018-09-18: 10 mg via INTRAVENOUS
  Administered 2018-09-18: 40 mg via INTRAVENOUS
  Administered 2018-09-18: 10 mg via INTRAVENOUS

## 2018-09-18 MED ORDER — POTASSIUM CHLORIDE 10 MEQ/50ML IV SOLN: 10.0000 meq | INTRAVENOUS | Status: AC

## 2018-09-18 MED ORDER — ACETAMINOPHEN 160 MG/5ML PO SOLN: 1000.0000 mg | Freq: Four times a day (QID) | ORAL | Status: DC

## 2018-09-18 MED ORDER — ESMOLOL HCL 100 MG/10ML IV SOLN
INTRAVENOUS | Status: AC
  Filled 2018-09-18: qty 10

## 2018-09-18 MED ORDER — MIDAZOLAM HCL 5 MG/5ML IJ SOLN
INTRAMUSCULAR | Status: DC | PRN
  Administered 2018-09-18: 3 mg via INTRAVENOUS
  Administered 2018-09-18: 1 mg via INTRAVENOUS

## 2018-09-18 MED ORDER — VECURONIUM BROMIDE 10 MG IV SOLR
INTRAVENOUS | Status: DC | PRN
  Administered 2018-09-18 (×2): 5 mg via INTRAVENOUS

## 2018-09-18 MED ORDER — ROCURONIUM BROMIDE 10 MG/ML (PF) SYRINGE
PREFILLED_SYRINGE | INTRAVENOUS | Status: DC | PRN
  Administered 2018-09-18 (×3): 50 mg via INTRAVENOUS

## 2018-09-18 MED ORDER — PROTAMINE SULFATE 10 MG/ML IV SOLN
INTRAVENOUS | Status: DC | PRN
  Administered 2018-09-18: 50 mg via INTRAVENOUS
  Administered 2018-09-18: 200 mg via INTRAVENOUS

## 2018-09-18 MED ORDER — VASOPRESSIN 20 UNIT/ML IV SOLN
INTRAVENOUS | Status: AC
  Filled 2018-09-18: qty 1

## 2018-09-18 MED ORDER — ONDANSETRON HCL 4 MG/2ML IJ SOLN
INTRAMUSCULAR | Status: DC | PRN
  Administered 2018-09-18: 4 mg via INTRAVENOUS

## 2018-09-18 MED ORDER — LACTATED RINGERS IV SOLN
INTRAVENOUS | Status: DC | PRN
  Administered 2018-09-18: 07:00:00 via INTRAVENOUS

## 2018-09-18 MED ORDER — EPHEDRINE SULFATE-NACL 50-0.9 MG/10ML-% IV SOSY
PREFILLED_SYRINGE | INTRAVENOUS | Status: DC | PRN
  Administered 2018-09-18 (×4): 10 mg via INTRAVENOUS

## 2018-09-18 MED ORDER — INSULIN REGULAR(HUMAN) IN NACL 100-0.9 UT/100ML-% IV SOLN: INTRAVENOUS | Status: DC

## 2018-09-18 MED ORDER — PANTOPRAZOLE SODIUM 40 MG PO TBEC: 40.0000 mg | DELAYED_RELEASE_TABLET | Freq: Every day | ORAL | Status: DC

## 2018-09-18 MED ORDER — CHLORHEXIDINE GLUCONATE 0.12 % MT SOLN
15.0000 mL | OROMUCOSAL | Status: AC
  Administered 2018-09-18: 15 mL via OROMUCOSAL

## 2018-09-18 MED ORDER — DOCUSATE SODIUM 100 MG PO CAPS
200.0000 mg | ORAL_CAPSULE | Freq: Every day | ORAL | Status: DC
  Administered 2018-09-19: 200 mg via ORAL
  Filled 2018-09-18: qty 2

## 2018-09-18 MED ORDER — SODIUM CHLORIDE 0.9 % IV SOLN
1.5000 g | Freq: Two times a day (BID) | INTRAVENOUS | Status: AC
  Administered 2018-09-18 – 2018-09-20 (×4): 1.5 g via INTRAVENOUS
  Filled 2018-09-18 (×4): qty 1.5

## 2018-09-18 MED ORDER — ASPIRIN EC 325 MG PO TBEC
325.0000 mg | DELAYED_RELEASE_TABLET | Freq: Every day | ORAL | Status: DC
  Administered 2018-09-19: 325 mg via ORAL
  Filled 2018-09-18: qty 1

## 2018-09-18 MED ORDER — LACTATED RINGERS IV SOLN: INTRAVENOUS | Status: DC

## 2018-09-18 MED ORDER — HEMOSTATIC AGENTS (NO CHARGE) OPTIME
TOPICAL | Status: DC | PRN
  Administered 2018-09-18 (×2): 1 via TOPICAL

## 2018-09-18 MED ORDER — SODIUM CHLORIDE 0.9 % IV SOLN: 250.0000 mL | INTRAVENOUS | Status: DC

## 2018-09-18 MED ORDER — TAMSULOSIN HCL 0.4 MG PO CAPS
0.4000 mg | ORAL_CAPSULE | Freq: Every evening | ORAL | Status: DC
  Administered 2018-09-19 – 2018-09-22 (×4): 0.4 mg via ORAL
  Filled 2018-09-18 (×4): qty 1

## 2018-09-18 MED ORDER — TRAMADOL HCL 50 MG PO TABS
50.0000 mg | ORAL_TABLET | ORAL | Status: DC | PRN
  Administered 2018-09-19: 50 mg via ORAL
  Filled 2018-09-18: qty 1

## 2018-09-18 MED ORDER — 0.9 % SODIUM CHLORIDE (POUR BTL) OPTIME
TOPICAL | Status: DC | PRN
  Administered 2018-09-18: 6000 mL

## 2018-09-18 MED ORDER — CHLORHEXIDINE GLUCONATE 0.12 % MT SOLN
15.0000 mL | Freq: Two times a day (BID) | OROMUCOSAL | Status: DC
  Administered 2018-09-19 – 2018-09-22 (×6): 15 mL via OROMUCOSAL
  Filled 2018-09-18 (×6): qty 15

## 2018-09-18 MED ORDER — NOREPINEPHRINE 4 MG/250ML-% IV SOLN
0.0000 ug/min | INTRAVENOUS | Status: DC
  Filled 2018-09-18: qty 250

## 2018-09-18 MED ORDER — MORPHINE SULFATE (PF) 2 MG/ML IV SOLN
1.0000 mg | INTRAVENOUS | Status: AC | PRN
  Administered 2018-09-18: 3 mg via INTRAVENOUS

## 2018-09-18 MED ORDER — ONDANSETRON HCL 4 MG/2ML IJ SOLN
4.0000 mg | Freq: Four times a day (QID) | INTRAMUSCULAR | Status: DC | PRN
  Administered 2018-09-19: 4 mg via INTRAVENOUS
  Filled 2018-09-18: qty 2

## 2018-09-18 MED ORDER — THROMBIN 5000 UNITS EX SOLR
CUTANEOUS | Status: DC | PRN
  Administered 2018-09-18 (×3): 5000 [IU] via TOPICAL

## 2018-09-18 MED ORDER — HEPARIN SODIUM (PORCINE) 1000 UNIT/ML IJ SOLN
INTRAMUSCULAR | Status: DC | PRN
  Administered 2018-09-18: 25000 [IU] via INTRAVENOUS

## 2018-09-18 MED ORDER — BISACODYL 5 MG PO TBEC
10.0000 mg | DELAYED_RELEASE_TABLET | Freq: Every day | ORAL | Status: DC
  Administered 2018-09-19: 10 mg via ORAL
  Filled 2018-09-18: qty 2

## 2018-09-18 MED ORDER — ALBUMIN HUMAN 5 % IV SOLN
250.0000 mL | INTRAVENOUS | Status: DC | PRN
  Administered 2018-09-18 – 2018-09-19 (×2): 12.5 g via INTRAVENOUS

## 2018-09-18 MED ORDER — VASOPRESSIN 20 UNIT/ML IV SOLN
INTRAVENOUS | Status: DC | PRN
  Administered 2018-09-18: 1 [IU] via INTRAVENOUS

## 2018-09-18 MED ORDER — METOPROLOL TARTRATE 25 MG/10 ML ORAL SUSPENSION: 12.5000 mg | Freq: Two times a day (BID) | ORAL | Status: DC

## 2018-09-18 MED ORDER — OMEGA-3-ACID ETHYL ESTERS 1 G PO CAPS
1.0000 g | ORAL_CAPSULE | Freq: Two times a day (BID) | ORAL | Status: DC
  Administered 2018-09-21 – 2018-09-23 (×5): 1 g via ORAL
  Filled 2018-09-18 (×5): qty 1

## 2018-09-18 MED ORDER — LACTATED RINGERS IV SOLN
INTRAVENOUS | Status: DC | PRN
  Administered 2018-09-18: 08:00:00 via INTRAVENOUS

## 2018-09-18 MED ORDER — PHENYLEPHRINE HCL-NACL 20-0.9 MG/250ML-% IV SOLN
0.0000 ug/min | INTRAVENOUS | Status: DC
  Filled 2018-09-18: qty 250

## 2018-09-18 MED ORDER — CHLORHEXIDINE GLUCONATE 0.12 % MT SOLN
15.0000 mL | Freq: Once | OROMUCOSAL | Status: AC
  Administered 2018-09-18: 15 mL via OROMUCOSAL
  Filled 2018-09-18: qty 15

## 2018-09-18 MED ORDER — PROPOFOL 10 MG/ML IV BOLUS
INTRAVENOUS | Status: AC
  Filled 2018-09-18: qty 20

## 2018-09-18 MED ORDER — CHLORHEXIDINE GLUCONATE 4 % EX LIQD: 30.0000 mL | CUTANEOUS | Status: DC

## 2018-09-18 MED ORDER — MIDAZOLAM HCL 2 MG/2ML IJ SOLN: 2.0000 mg | INTRAMUSCULAR | Status: DC | PRN

## 2018-09-18 MED ORDER — VANCOMYCIN HCL IN DEXTROSE 1-5 GM/200ML-% IV SOLN
1000.0000 mg | Freq: Once | INTRAVENOUS | Status: AC
  Administered 2018-09-18: 1000 mg via INTRAVENOUS
  Filled 2018-09-18: qty 200

## 2018-09-18 MED ORDER — LACTATED RINGERS IV SOLN: 500.0000 mL | Freq: Once | INTRAVENOUS | Status: DC | PRN

## 2018-09-18 MED ORDER — BISACODYL 10 MG RE SUPP: 10.0000 mg | Freq: Every day | RECTAL | Status: DC

## 2018-09-18 MED ORDER — HEMOSTATIC AGENTS (NO CHARGE) OPTIME
TOPICAL | Status: DC | PRN
  Administered 2018-09-18: 1 via TOPICAL

## 2018-09-18 MED ORDER — ACETAMINOPHEN 500 MG PO TABS
1000.0000 mg | ORAL_TABLET | Freq: Four times a day (QID) | ORAL | Status: DC
  Administered 2018-09-19 – 2018-09-20 (×6): 1000 mg via ORAL
  Filled 2018-09-18 (×6): qty 2

## 2018-09-18 MED ORDER — OXYCODONE HCL 5 MG PO TABS
5.0000 mg | ORAL_TABLET | ORAL | Status: DC | PRN
  Administered 2018-09-18 – 2018-09-19 (×5): 10 mg via ORAL
  Filled 2018-09-18 (×5): qty 2

## 2018-09-18 MED ORDER — SODIUM CHLORIDE 0.9% FLUSH
3.0000 mL | INTRAVENOUS | Status: DC | PRN
  Administered 2018-09-19: 3 mL via INTRAVENOUS
  Filled 2018-09-18: qty 3

## 2018-09-18 SURGICAL SUPPLY — 119 items
ADAPTER CARDIO PERF ANTE/RETRO (ADAPTER) ×3 IMPLANT
ADPR PRFSN 84XANTGRD RTRGD (ADAPTER) ×2
BAG DECANTER FOR FLEXI CONT (MISCELLANEOUS) ×3 IMPLANT
BANDAGE ACE 4X5 VEL STRL LF (GAUZE/BANDAGES/DRESSINGS) ×3 IMPLANT
BANDAGE ACE 6X5 VEL STRL LF (GAUZE/BANDAGES/DRESSINGS) ×3 IMPLANT
BANDAGE ELASTIC 4 VELCRO ST LF (GAUZE/BANDAGES/DRESSINGS) ×1 IMPLANT
BANDAGE ELASTIC 6 VELCRO ST LF (GAUZE/BANDAGES/DRESSINGS) ×1 IMPLANT
BASKET HEART (ORDER IN 25'S) (MISCELLANEOUS) ×1
BASKET HEART (ORDER IN 25S) (MISCELLANEOUS) ×2 IMPLANT
BLADE STERNUM SYSTEM 6 (BLADE) ×3 IMPLANT
BLADE SURG 11 STRL SS (BLADE) ×1 IMPLANT
BLADE SURG 15 STRL LF DISP TIS (BLADE) ×2 IMPLANT
BLADE SURG 15 STRL SS (BLADE) ×3
BNDG GAUZE ELAST 4 BULKY (GAUZE/BANDAGES/DRESSINGS) ×3 IMPLANT
CANISTER SUCT 3000ML PPV (MISCELLANEOUS) ×3 IMPLANT
CANNULA GUNDRY RCSP 15FR (MISCELLANEOUS) ×4 IMPLANT
CATH HEART VENT LEFT (CATHETERS) ×2 IMPLANT
CATH ROBINSON RED A/P 18FR (CATHETERS) ×9 IMPLANT
CATH THORACIC 28FR (CATHETERS) ×3 IMPLANT
CATH THORACIC 36FR (CATHETERS) ×3 IMPLANT
CATH THORACIC 36FR RT ANG (CATHETERS) ×3 IMPLANT
CLIP VESOCCLUDE MED 24/CT (CLIP) IMPLANT
CLIP VESOCCLUDE SM WIDE 24/CT (CLIP) IMPLANT
CONT SPEC 4OZ CLIKSEAL STRL BL (MISCELLANEOUS) ×4 IMPLANT
COVER SURGICAL LIGHT HANDLE (MISCELLANEOUS) ×3 IMPLANT
COVER WAND RF STERILE (DRAPES) ×3 IMPLANT
CRADLE DONUT ADULT HEAD (MISCELLANEOUS) ×3 IMPLANT
DRAPE CARDIOVASCULAR INCISE (DRAPES) ×3
DRAPE INCISE IOBAN 66X45 STRL (DRAPES) ×1 IMPLANT
DRAPE SLUSH/WARMER DISC (DRAPES) ×3 IMPLANT
DRAPE SRG 135X102X78XABS (DRAPES) ×2 IMPLANT
DRSG COVADERM 4X14 (GAUZE/BANDAGES/DRESSINGS) ×3 IMPLANT
ELECT CAUTERY BLADE 6.4 (BLADE) ×4 IMPLANT
ELECT REM PT RETURN 9FT ADLT (ELECTROSURGICAL) ×6
ELECTRODE REM PT RTRN 9FT ADLT (ELECTROSURGICAL) ×4 IMPLANT
FELT TEFLON 1X6 (MISCELLANEOUS) ×6 IMPLANT
GAUZE SPONGE 4X4 12PLY STRL (GAUZE/BANDAGES/DRESSINGS) ×6 IMPLANT
GAUZE SPONGE 4X4 12PLY STRL LF (GAUZE/BANDAGES/DRESSINGS) ×2 IMPLANT
GLOVE BIO SURGEON STRL SZ 6 (GLOVE) IMPLANT
GLOVE BIO SURGEON STRL SZ 6.5 (GLOVE) ×6 IMPLANT
GLOVE BIO SURGEON STRL SZ7 (GLOVE) IMPLANT
GLOVE BIO SURGEON STRL SZ7.5 (GLOVE) IMPLANT
GLOVE BIOGEL PI IND STRL 6 (GLOVE) IMPLANT
GLOVE BIOGEL PI IND STRL 6.5 (GLOVE) IMPLANT
GLOVE BIOGEL PI IND STRL 7.0 (GLOVE) IMPLANT
GLOVE BIOGEL PI INDICATOR 6 (GLOVE) ×3
GLOVE BIOGEL PI INDICATOR 6.5 (GLOVE) ×2
GLOVE BIOGEL PI INDICATOR 7.0 (GLOVE)
GLOVE EUDERMIC 7 POWDERFREE (GLOVE) ×6 IMPLANT
GLOVE ORTHO TXT STRL SZ7.5 (GLOVE) IMPLANT
GOWN STRL REUS W/ TWL LRG LVL3 (GOWN DISPOSABLE) ×8 IMPLANT
GOWN STRL REUS W/ TWL XL LVL3 (GOWN DISPOSABLE) ×2 IMPLANT
GOWN STRL REUS W/TWL LRG LVL3 (GOWN DISPOSABLE) ×24
GOWN STRL REUS W/TWL XL LVL3 (GOWN DISPOSABLE) ×3
HEMOSTAT POWDER SURGIFOAM 1G (HEMOSTASIS) ×9 IMPLANT
HEMOSTAT SURGICEL 2X14 (HEMOSTASIS) ×3 IMPLANT
INSERT FOGARTY 61MM (MISCELLANEOUS) IMPLANT
INSERT FOGARTY XLG (MISCELLANEOUS) IMPLANT
KIT BASIN OR (CUSTOM PROCEDURE TRAY) ×3 IMPLANT
KIT CATH CPB BARTLE (MISCELLANEOUS) ×3 IMPLANT
KIT SUCTION CATH 14FR (SUCTIONS) ×3 IMPLANT
KIT TURNOVER KIT B (KITS) ×3 IMPLANT
KIT VASOVIEW HEMOPRO 2 VH 4000 (KITS) ×2 IMPLANT
KIT VASOVIEW HEMOPRO VH 3000 (KITS) ×1 IMPLANT
LINE VENT (MISCELLANEOUS) ×1 IMPLANT
NS IRRIG 1000ML POUR BTL (IV SOLUTION) ×18 IMPLANT
PACK E OPEN HEART (SUTURE) ×3 IMPLANT
PACK OPEN HEART (CUSTOM PROCEDURE TRAY) ×3 IMPLANT
PAD ARMBOARD 7.5X6 YLW CONV (MISCELLANEOUS) ×6 IMPLANT
PAD ELECT DEFIB RADIOL ZOLL (MISCELLANEOUS) ×3 IMPLANT
PENCIL BUTTON HOLSTER BLD 10FT (ELECTRODE) ×3 IMPLANT
PUNCH AORTIC ROTATE 4.0MM (MISCELLANEOUS) IMPLANT
PUNCH AORTIC ROTATE 4.5MM 8IN (MISCELLANEOUS) ×3 IMPLANT
PUNCH AORTIC ROTATE 5MM 8IN (MISCELLANEOUS) IMPLANT
SET CARDIOPLEGIA MPS 5001102 (MISCELLANEOUS) ×1 IMPLANT
SPONGE INTESTINAL PEANUT (DISPOSABLE) IMPLANT
SPONGE LAP 18X18 X RAY DECT (DISPOSABLE) IMPLANT
SPONGE LAP 4X18 RFD (DISPOSABLE) ×3 IMPLANT
SUT BONE WAX W31G (SUTURE) ×3 IMPLANT
SUT ETHIBON 2 0 V 52N 30 (SUTURE) ×6 IMPLANT
SUT ETHIBOND 2 0 SH (SUTURE) ×3
SUT ETHIBOND 2 0 SH 36X2 (SUTURE) IMPLANT
SUT MNCRL AB 4-0 PS2 18 (SUTURE) ×1 IMPLANT
SUT PROLENE 3 0 SH DA (SUTURE) IMPLANT
SUT PROLENE 3 0 SH1 36 (SUTURE) ×3 IMPLANT
SUT PROLENE 4 0 RB 1 (SUTURE) ×12
SUT PROLENE 4 0 SH DA (SUTURE) IMPLANT
SUT PROLENE 4-0 RB1 .5 CRCL 36 (SUTURE) ×6 IMPLANT
SUT PROLENE 5 0 C 1 36 (SUTURE) IMPLANT
SUT PROLENE 6 0 C 1 30 (SUTURE) IMPLANT
SUT PROLENE 7 0 BV 1 (SUTURE) IMPLANT
SUT PROLENE 7 0 BV1 MDA (SUTURE) ×3 IMPLANT
SUT PROLENE 8 0 BV175 6 (SUTURE) IMPLANT
SUT SILK  1 MH (SUTURE)
SUT SILK 1 MH (SUTURE) IMPLANT
SUT SILK 2 0 SH CR/8 (SUTURE) ×1 IMPLANT
SUT STEEL 6MS V (SUTURE) IMPLANT
SUT STEEL STERNAL CCS#1 18IN (SUTURE) IMPLANT
SUT STEEL SZ 6 DBL 3X14 BALL (SUTURE) ×3 IMPLANT
SUT VIC AB 1 CTX 36 (SUTURE) ×6
SUT VIC AB 1 CTX36XBRD ANBCTR (SUTURE) ×4 IMPLANT
SUT VIC AB 2-0 CT1 27 (SUTURE) ×3
SUT VIC AB 2-0 CT1 TAPERPNT 27 (SUTURE) IMPLANT
SUT VIC AB 2-0 CTX 27 (SUTURE) IMPLANT
SUT VIC AB 3-0 SH 27 (SUTURE)
SUT VIC AB 3-0 SH 27X BRD (SUTURE) IMPLANT
SUT VIC AB 3-0 X1 27 (SUTURE) IMPLANT
SUT VICRYL 4-0 PS2 18IN ABS (SUTURE) IMPLANT
SYSTEM SAHARA CHEST DRAIN ATS (WOUND CARE) ×3 IMPLANT
TAPE CLOTH SURG 4X10 WHT LF (GAUZE/BANDAGES/DRESSINGS) ×1 IMPLANT
TAPE PAPER 2X10 WHT MICROPORE (GAUZE/BANDAGES/DRESSINGS) ×1 IMPLANT
TOWEL GREEN STERILE (TOWEL DISPOSABLE) ×3 IMPLANT
TOWEL GREEN STERILE FF (TOWEL DISPOSABLE) ×3 IMPLANT
TRAY FOLEY SLVR 16FR TEMP STAT (SET/KITS/TRAYS/PACK) ×3 IMPLANT
TUBING INSUFFLATION (TUBING) ×3 IMPLANT
UNDERPAD 30X30 (UNDERPADS AND DIAPERS) ×3 IMPLANT
VALVE AORTIC SZ25 INSP/RESIL (Prosthesis & Implant Heart) ×1 IMPLANT
VENT LEFT HEART 12002 (CATHETERS) ×3
WATER STERILE IRR 1000ML POUR (IV SOLUTION) ×6 IMPLANT

## 2018-09-18 NOTE — Anesthesia Procedure Notes (Signed)
Central Venous Catheter Insertion Performed by: Oleta Mouse, MD, anesthesiologist Start/End10/28/2019 6:56 AM, 09/18/2018 7:05 AM Patient location: Pre-op. Preanesthetic checklist: patient identified, IV checked, site marked, risks and benefits discussed, surgical consent, monitors and equipment checked, pre-op evaluation, timeout performed and anesthesia consent Hand hygiene performed  and maximum sterile barriers used  PA cath was placed.Swan type:thermodilution Procedure performed without using ultrasound guided technique. Attempts: 1 Patient tolerated the procedure well with no immediate complications.

## 2018-09-18 NOTE — Anesthesia Procedure Notes (Signed)
Central Venous Catheter Insertion Performed by: Oleta Mouse, MD, anesthesiologist Start/End10/28/2019 6:54 AM, 09/18/2018 7:05 AM Patient location: Pre-op. Preanesthetic checklist: patient identified, IV checked, site marked, risks and benefits discussed, surgical consent, monitors and equipment checked, pre-op evaluation, timeout performed and anesthesia consent Position: supine Lidocaine 1% used for infiltration and patient sedated Hand hygiene performed  and maximum sterile barriers used  Catheter size: 8.5 Fr Total catheter length 10. Sheath introducer Procedure performed using ultrasound guided technique. Ultrasound Notes:anatomy identified, needle tip was noted to be adjacent to the nerve/plexus identified, no ultrasound evidence of intravascular and/or intraneural injection and image(s) printed for medical record Attempts: 1 Following insertion, line sutured, dressing applied and Biopatch. Post procedure assessment: blood return through all ports, free fluid flow and no air  Patient tolerated the procedure well with no immediate complications.

## 2018-09-18 NOTE — Transfer of Care (Signed)
Immediate Anesthesia Transfer of Care Note  Patient: Daniel Mendez  Procedure(s) Performed: CORONARY ARTERY BYPASS GRAFTING (CABG) X 1 using right greater saphenous vein harvested endoscopically. (N/A Chest) AORTIC VALVE REPLACEMENT (AVR) using inspiris valve - size 25 (N/A Chest) TRANSESOPHAGEAL ECHOCARDIOGRAM (TEE) (N/A )  Patient Location: ICU  Anesthesia Type:General  Level of Consciousness: sedated and Patient remains intubated per anesthesia plan  Airway & Oxygen Therapy: Patient remains intubated per anesthesia plan and Patient placed on Ventilator (see vital sign flow sheet for setting)  Post-op Assessment: Report given to RN and Post -op Vital signs reviewed and stable  Post vital signs: Reviewed and stable  Last Vitals:  Vitals Value Taken Time  BP 91/64 09/18/2018  1:00 PM  Temp    Pulse 89 09/18/2018  1:02 PM  Resp 12 09/18/2018  1:02 PM  SpO2 96 % 09/18/2018  1:02 PM  Vitals shown include unvalidated device data.  Last Pain:  Vitals:   09/18/18 0616  TempSrc:   PainSc: 0-No pain      Patients Stated Pain Goal: 2 (16/10/96 0454)  Complications: No apparent anesthesia complications

## 2018-09-18 NOTE — Progress Notes (Signed)
Pt placed back on full vernt support due to low RR and Mve.  RN notified.

## 2018-09-18 NOTE — OR Nursing (Signed)
11

## 2018-09-18 NOTE — H&P (Signed)
[address]       [place] [address]             [phone]      Cardiothoracic Surgery Admission History and Physical   PCP is Seward Carol, MD  Referring Provider is Belva Crome, MD      Chief Complaint  Patient presents with  . Aortic Stenosis       HPI:  The patient is a 77 year old gentleman with a history of hypertension, hyperlipidemia, type 2 diabetes, cerebrovascular disease status post left carotid endarterectomy, coronary disease with known ostial/proximal occlusion of the right coronary artery by catheterization in 06/2017, and severe aortic stenosis. 2D echocardiogram in 07/2017 showed a mean gradient across aortic valve of 22 mmHg with a dimensionless index of 0.38 and a valve area of 1.18 cm. His cardiac catheterization at that time showed chronic total occlusion of the RCA as mentioned above with collaterals from the left coronary system. There were luminal irregularities in the proximal and mid LAD without significant stenosis. The left circumflex had no significant disease. He was continued on medical therapy for exertional angina. A follow-up echocardiogram on 08/14/2018 shows progression of his aortic stenosis with a peak velocity of 4 m/s corresponding to a mean gradient of 39 mmHg with a dimensionless index of 0.3. The aortic valve area was measured at 1.15 cm. Left ventricular ejection fraction was 60 to 65%. Cardiac catheterization was performed again on 09/01/2018. The gradient across aortic valve was measured at 21 mmHg. The chronic total occlusion of the right coronary artery was unchanged. The LAD and left circumflex had nonobstructive disease.  The patient is here today with his wife. He reports increasing exertional shortness of breath and chest tightness with activity. He has not been able to walk as far as he was 6 months ago and has difficulty doing his yard work. He denies any dizziness or syncope. He has not had any orthopnea.    Past Medical History:  Diagnosis Date  . Anginal pain ([place])   . Arthritis    hand- right  . Coronary artery disease   . Diabetes ([place])    type 2  . GERD (gastroesophageal reflux disease)   . Heart murmur    discovered in 2015 - followed by Dr. Daneen Schick  . History of hiatal hernia   . History of kidney stones   . HTN (hypertension)   . Hypercalcemia   . Hyperglyceridemia   . Hyperlipidemia   . Mild aortic stenosis Oct 2014  . Peripheral vascular disease ([place])   . Pneumonia 07/28/2018  . Shortness of breath dyspnea    on exertion  . Sleep apnea    no cpap        Past Surgical History:  Procedure Laterality Date  . CATARACT EXTRACTION Bilateral   . COLONOSCOPY WITH PROPOFOL N/A 11/19/2014   Procedure: COLONOSCOPY WITH PROPOFOL; Surgeon: Garlan Fair, MD; Location: [facility]; Service: Endoscopy; Laterality: N/A;  . dental implants     dental post implanted to hold dentures/partials in place  . ENDARTERECTOMY Left 03/10/2015   Procedure: Left Carotid ENDARTERECTOMY ; Surgeon: Rosetta Posner, MD; Location: [facility]; Service: Vascular; Laterality: Left;  . FRACTURE SURGERY Left 1958   Left hand   . HERNIA REPAIR     Bilateral inguinal   . hernia repair b/l inguinal Bilateral   . knee arthroscopy left Left   . RIGHT/LEFT HEART CATH AND CORONARY ANGIOGRAPHY N/A  08/12/2017   Procedure: RIGHT/LEFT HEART CATH AND CORONARY ANGIOGRAPHY; Surgeon: Belva Crome, MD; Location: [facility]; Service: Cardiovascular; Laterality: N/A;  . RIGHT/LEFT HEART CATH AND CORONARY ANGIOGRAPHY N/A 09/01/2018   Procedure: RIGHT/LEFT HEART CATH AND CORONARY ANGIOGRAPHY; Surgeon: Belva Crome, MD; Location: [facility]; Service: Cardiovascular; Laterality: N/A;  . ROTATOR CUFF REPAIR    . VASECTOMY          Family History  Problem Relation Age of Onset  . CVA Mother   . Peripheral vascular disease Mother   . Heart disease Father   . Heart disease Brother   . Diabetes  Brother   . Cancer Daughter    Social History  Social History        Tobacco Use  . Smoking status: Never Smoker  . Smokeless tobacco: Never Used  Substance Use Topics  . Alcohol use: Yes    Alcohol/week: 0.0 standard drinks    Comment: rarely  . Drug use: No         Current Outpatient Medications  Medication Sig Dispense Refill  . aspirin EC 81 MG tablet Take 81 mg by mouth at bedtime.    [name] Kitchen atorvastatin (LIPITOR) 20 MG tablet Take 20 mg by mouth daily at 12 noon.     . cyclobenzaprine (FLEXERIL) 5 MG tablet Take 2.5 mg by mouth daily as needed for muscle spasms.     [name] Kitchen glimepiride (AMARYL) 4 MG tablet Take 4 mg by mouth daily with breakfast.    . metFORMIN (GLUCOPHAGE) 500 MG tablet Take 1,000 mg by mouth 2 (two) times daily.     . metoprolol succinate (TOPROL-XL) 25 MG 24 hr tablet TAKE 1/2 TABLET BY MOUTH EVERY DAY. 45 tablet 3  . Omega-3 Fatty Acids (FISH OIL) 1000 MG CAPS Take 1,000 mg by mouth 2 (two) times daily.     [name] Kitchen omeprazole (PRILOSEC) 20 MG capsule Take 20 mg by mouth every other day. At noon    . ONE TOUCH ULTRA TEST test strip 1 each by Other route daily.     Glory Rosebush DELICA LANCETS 14E MISC 1 each by Other route daily.     . ranolazine (RANEXA) 1000 MG SR tablet Take 1 tablet (1,000 mg total) by mouth 2 (two) times daily. 180 tablet 3  . sitaGLIPtin (JANUVIA) 100 MG tablet Take 100 mg by mouth daily with lunch.     . tamsulosin (FLOMAX) 0.4 MG CAPS capsule Take 0.4 mg by mouth every evening.    [name] Kitchen telmisartan (MICARDIS) 80 MG tablet Take 80 mg by mouth daily with lunch.     . testosterone cypionate (DEPOTESTOSTERONE CYPIONATE) 200 MG/ML injection Inject 200 mg into the muscle every 14 (fourteen) days.     No current facility-administered medications for this visit.    No Known Allergies  Review of Systems  Constitutional: Positive for activity change and fatigue.  HENT: Positive for hearing loss. Negative for dental problem.  Wears dentures  Eyes: Negative.   Respiratory: Positive for shortness of breath.  Cardiovascular: Positive for chest pain. Negative for palpitations and leg swelling.  Gastrointestinal: Negative.  Endocrine: Negative.  Genitourinary: Positive for frequency.  BPH  Musculoskeletal: Negative.  Skin: Negative.  Neurological: Negative for dizziness and syncope.  Hematological: Negative.  Psychiatric/Behavioral: Negative.   BP (!) 168/79  Pulse 64  Resp 20  Ht 5\' 10"  (1.778 m)  Wt 178 lb (80.7 kg)  SpO2 95% Comment: RA  BMI 25.54 kg/m  Physical Exam  Constitutional: He is oriented to person, place, and time. He appears well-developed and well-nourished. No distress.  HENT:  Head: Normocephalic and atraumatic.  Mouth/Throat: Oropharynx is clear and moist.  Eyes: Pupils are equal, round, and reactive to light. Conjunctivae and EOM are normal.  Neck: Normal range of motion. Neck supple. No JVD present. No thyromegaly present.  Cardiovascular: Normal rate and regular rhythm.  Murmur heard. 3/6 systolic murmur along the right sternal border  Pulmonary/Chest: Effort normal and breath sounds normal. No respiratory distress.  Abdominal: Soft. Bowel sounds are normal. He exhibits no distension.  Musculoskeletal: Normal range of motion. He exhibits no edema.  Lymphadenopathy:  He has no cervical adenopathy.  Neurological: He is alert and oriented to person, place, and time. He has normal strength. No cranial nerve deficit or sensory deficit.  Skin: Skin is warm and dry.  Psychiatric: He has a normal mood and affect.   Diagnostic Tests:  [facility]  [address], [place] [address]  [phone]  -------------------------------------------------------------------  Transthoracic Echocardiography  Patient: Daniel Mendez, Daniel Mendez  MR #: [id number]  Study Date: 08/14/2018  Gender: M  Age: 20  Height: 177.8 cm  Weight: 79.8 kg  BSA: 1.99 m^2  Pt. Status:  Room:  ATTENDING Belva Crome, MD   Livia Snellen Belva Crome, MD  REFERRING Belva Crome, MD  SONOGRAPHER Cindy Hazy, RDCS  PERFORMING [facility], Outpatient  cc:  -------------------------------------------------------------------  LV EF: 60% - 65%  -------------------------------------------------------------------  Indications: I35.0 Aortic Stenosis.  -------------------------------------------------------------------  History: PMH: Acquired from the patient and from the patient&'s  chart. PMH: Murmur. Coronary artery disease. Sleep apnea.  Shortness of breath. Risk factors: Hypertension. Diabetes  mellitus. Dyslipidemia.  -------------------------------------------------------------------  Study Conclusions  - Left ventricle: The cavity size was normal. Wall thickness was  increased in a pattern of mild LVH. Systolic function was normal.  The estimated ejection fraction was in the range of 60% to 65%.  Left ventricular diastolic function parameters were normal.  - Aortic valve: Progressive AS since last study mean gradient has  increased from 26 mmHg to 39 mmHg. Valve area 1.1 but indexes to  .52 cm2. There was moderate to severe stenosis.  - Left atrium: The atrium was moderately dilated.  - Atrial septum: No defect or patent foramen ovale was identified.  -------------------------------------------------------------------  Study data: Study status: Routine. Procedure: The patient  reported no pain pre or post test. Transthoracic echocardiography  for left ventricular function evaluation, for right ventricular  function evaluation, and for assessment of valvular function. Image  quality was adequate. Study completion: There were no  complications. Transthoracic echocardiography. M-mode,  complete 2D, spectral Doppler, and color Doppler. Birthdate:  Patient birthdate: [birth year]. Age: Patient is 77 yr old. Sex:  Gender: male. BMI: 25.3 kg/m^2. Blood pressure: 112/68  Patient status: Outpatient. Study date:  Study date: 08/14/2018.  Study time: 03:05 PM. Location: [facility]  -------------------------------------------------------------------  -------------------------------------------------------------------  Left ventricle: The cavity size was normal. Wall thickness was  increased in a pattern of mild LVH. Systolic function was normal.  The estimated ejection fraction was in the range of 60% to 65%. The  transmitral flow pattern was normal. The deceleration time of the  early transmitral flow velocity was normal. The pulmonary vein flow  pattern was normal. The tissue Doppler parameters were normal. Left  ventricular diastolic function parameters were normal.  -------------------------------------------------------------------  Aortic valve: Progressive AS since last study mean gradient  has  increased from 26 mmHg to 39 mmHg. Valve area 1.1 but indexes to  .52 cm2. Trileaflet; moderately thickened, moderately calcified  leaflets. Doppler: There was moderate to severe stenosis.  VTI ratio of LVOT to aortic valve: 0.32. Valve area (VTI): 1.11  cm^2. Indexed valve area (VTI): 0.56 cm^2/m^2. Peak velocity ratio  of LVOT to aortic valve: 0.3. Valve area (Vmax): 1.03 cm^2. Indexed  valve area (Vmax): 0.52 cm^2/m^2. Mean velocity ratio of LVOT to  aortic valve: 0.33. Valve area (Vmean): 1.15 cm^2. Indexed valve  area (Vmean): 0.58 cm^2/m^2. Mean gradient (S): 34 mm Hg. Peak  gradient (S): 66 mm Hg.  -------------------------------------------------------------------  Aorta: The aorta was normal, not dilated, and non-diseased.  -------------------------------------------------------------------  Mitral valve: Mildly thickened leaflets . Doppler: There was  trivial regurgitation. Peak gradient (D): 4 mm Hg.  -------------------------------------------------------------------  Left atrium: The atrium was moderately dilated.    -------------------------------------------------------------------  Atrial septum: No defect or patent foramen ovale was identified.  -------------------------------------------------------------------  Pulmonic valve: Doppler: There was mild regurgitation.  -------------------------------------------------------------------  Tricuspid valve: Doppler: There was mild regurgitation.  -------------------------------------------------------------------  Right atrium: The atrium was normal in size.  -------------------------------------------------------------------  Systemic veins:  Inferior vena cava: The vessel was normal in size. The  respirophasic diameter changes were in the normal range (>= 50%),  consistent with normal central venous pressure.  -------------------------------------------------------------------  Post procedure conclusions  Ascending Aorta:  - The aorta was normal, not dilated, and non-diseased.  -------------------------------------------------------------------  Measurements  Left ventricle Value Reference  LV ID, ED, PLAX chordal (L) 38 mm 43 - 52  LV ID, ES, PLAX chordal (L) 22 mm 23 - 38  LV fx shortening, PLAX chordal 42 % >=29  LV PW thickness, ED 14 mm ----------  IVS/LV PW ratio, ED 0.93 <=1.3  Stroke volume, 2D 89 ml ----------  Stroke volume/bsa, 2D 45 ml/m^2 ----------  LV e&', lateral 8.7 cm/s ----------  LV E/e&', lateral 11.25 ----------  LV e&', medial 6.53 cm/s ----------  LV E/e&', medial 14.99 ----------  LV e&', average 7.62 cm/s ----------  LV E/e&', average 12.86 ----------  Ventricular septum Value Reference  IVS thickness, ED 13 mm ----------  LVOT Value Reference  LVOT ID, S 21 mm ----------  LVOT area 3.46 cm^2 ----------  LVOT ID 21 mm ----------  LVOT peak velocity, S 120 cm/s ----------  LVOT mean velocity, S 90.8 cm/s ----------  LVOT VTI, S 25.6 cm ----------  LVOT peak gradient, S 6 mm Hg ----------  Stroke volume (SV),  LVOT DP 88.7 ml ----------  Stroke index (SV/bsa), LVOT DP 44.5 ml/m^2 ----------  Aortic valve Value Reference  Aortic valve peak velocity, S 405 cm/s ----------  Aortic valve mean velocity, S 273 cm/s ----------  Aortic valve VTI, S 79.9 cm ----------  Aortic mean gradient, S 34 mm Hg ----------  Aortic peak gradient, S 66 mm Hg ----------  VTI ratio, LVOT/AV 0.32 ----------  Aortic valve area, VTI 1.11 cm^2 ----------  Aortic valve area/bsa, VTI 0.56 cm^2/m^2 ----------  Velocity ratio, peak, LVOT/AV 0.3 ----------  Aortic valve area, peak velocity 1.03 cm^2 ----------  Aortic valve area/bsa, peak 0.52 cm^2/m^2 ----------  velocity  Velocity ratio, mean, LVOT/AV 0.33 ----------  Aortic valve area, mean velocity 1.15 cm^2 ----------  Aortic valve area/bsa, mean 0.58 cm^2/m^2 ----------  velocity  Aorta Value Reference  Aortic root ID, ED 36 mm ----------  Ascending aorta ID, A-P, S 35 mm ----------  Left atrium Value Reference  LA ID,  A-P, ES 50 mm ----------  LA ID/bsa, A-P (H) 2.51 cm/m^2 <=2.2  LA volume, S 52.4 ml ----------  LA volume/bsa, S 26.3 ml/m^2 ----------  LA volume, ES, 1-p A4C 45.2 ml ----------  LA volume/bsa, ES, 1-p A4C 22.7 ml/m^2 ----------  LA volume, ES, 1-p A2C 60.4 ml ----------  LA volume/bsa, ES, 1-p A2C 30.3 ml/m^2 ----------  Mitral valve Value Reference  Mitral E-wave peak velocity 97.9 cm/s ----------  Mitral A-wave peak velocity 118 cm/s ----------  Mitral deceleration time (H) 268 ms 150 - 230  Mitral peak gradient, D 4 mm Hg ----------  Mitral E/A ratio, peak 0.8 ----------  Right atrium Value Reference  RA ID, S-I, ES, A4C 47.6 mm 34 - 49  RA area, ES, A4C 17.2 cm^2 8.3 - 19.5  RA volume, ES, A/L 49.9 ml ----------  RA volume/bsa, ES, A/L 25 ml/m^2 ----------  Right ventricle Value Reference  RV ID, minor axis, ED, A4C base 40 mm ----------  TAPSE 23.8 mm ----------  RV s&', lateral, S 16.4 cm/s ----------  Legend:  (L) and (H) mark  values outside specified reference range.  -------------------------------------------------------------------  Prepared and Electronically Authenticated by  Jenkins Rouge, M.D.  2019-09-23T16:02:44  Physicians  Panel Physicians Referring Physician Case Authorizing Physician  Belva Crome, MD (Primary)    Procedures  RIGHT/LEFT HEART CATH AND CORONARY ANGIOGRAPHY  Conclusion  Calcific aortic stenosis, severe by echo criteria and mild to moderate by hemodynamic assessment. By hemodynamic assessment, using a cardiac output of 5.4 L/min and gradient of 21 mmHg the valve area is 1.72 cm  Previously documented normal LV systolic function with EF greater than 60%. Echo demonstrates mild to moderate concentric hypertrophy.  Coronary artery disease with ostial occlusion of the right coronary. The right coronary is codominant. The right coronary fills by left-to-right collaterals.  Widely patent left main  Diffuse atherosclerosis throughout the proximal to distal vessel with up to 40 to 50% mid vessel narrowing. No focal high-grade obstruction is seen.  Circumflex contains eccentric 40% proximal stenosis. RECOMMENDATIONS:  Despite discordance between hemodynamic and echo data, the valve has evidence of significant stenosis by echo, a peak velocity greater than 4 m/s by echo, angiography demonstrates a chronically occluded small to moderate size RCA, and the patient is severely symptomatic from both angina and dyspnea. Therefore, I will referred the patient for consideration of surgical aortic valve replacement and coronary bypass surgery.  I am somewhat concerned that there may be some other issue involved producing his symptoms. I have personally reviewed the echocardiogram and it does not particularly appear to exhibit amyloid. I cannot blame his symptoms on anything other than the combination of CAD and moderately severe aortic stenosis. Recommend Aspirin 81mg  daily for moderate CAD.  Indications    Atherosclerosis of native coronary artery of native heart, angina presence unspecified [I25.10 (ICD-10-CM)]  Nonrheumatic aortic valve stenosis [I35.0 (ICD-10-CM)]  Procedural Details/Technique  Technical Details The right radial area was sterilely prepped and draped. Intravenous sedation with Versed and fentanyl was administered. 1% Xylocaine was infiltrated to achieve local analgesia. Using real-time vascular ultrasound, a double wall stick with an angiocath was utilized to obtain intra-arterial access. A VUS image was saved for the permanent record.The modified Seldinger technique was used to place a 3F " Slender" sheath in the right radial artery. Weight based heparin was administered. Coronary angiography was done using 5 F catheters. Right coronary angiography was performed with a JR4. Left ventricular hemodymic recordings and angiography was done  using the JR 4 catheter and hand injection. Left coronary angiography was performed with a JL 3.5 cm.  Right heart catheterization was performed by exchanging a previously placed antecubital IV angio-cath for a 5 French Slender sheath. 1% Xylocaine was used to locally nesthetize the area around the IV site. The IV catheter was wired using an .018 guidewire. The modified Seldinger technique was used to place the 5 [place] sheath. Double glove technique was used to enhance sterility. After sheath insertion, right heart cath was performed using a 5 French balloon tipped catheter and fluoroscopic guidance. Pressures were recorded in each chamber and in the pulmonary capillary wedge position.. The main pulmonary artery O2 saturation was sampled.   Hemostasis was achieved using a pneumatic band.  During this procedure the patient is administered a total of Versed 1.5 mg and Fentanyl 25 mg to achieve and maintain moderate conscious sedation. The patient's heart rate, blood pressure, and oxygen saturation are monitored continuously during the procedure. The period  of conscious sedation is 35 minutes, of which I was present face-to-face 100% of this time.   Estimated blood loss <50 mL.  During this procedure the patient was administered the following to achieve and maintain moderate conscious sedation: Versed 1.5 mg, Fentanyl 25 mcg, while the patient's heart rate, blood pressure, and oxygen saturation were continuously monitored. The period of conscious sedation was 35 minutes, of which I was present face-to-face 100% of this time.  Coronary Findings  Diagnostic  Dominance: Co-dominant  Left Anterior Descending  Ost LAD to Mid LAD lesion 40% stenosed  Ost LAD to Mid LAD lesion is 40% stenosed.  Ramus Intermedius  Vessel is small.  Left Circumflex  Ost Cx to Prox Cx lesion 40% stenosed  Ost Cx to Prox Cx lesion is 40% stenosed.  Right Coronary Artery  Ost RCA to Prox RCA lesion 100% stenosed  Ost RCA to Prox RCA lesion is 100% stenosed.  First Right Posterolateral  Collaterals  1st RPLB filled by collaterals from 3rd Mrg.    Intervention  No interventions have been documented.  Right Heart  Right Heart Pressures Hemodynamic findings consistent with pulmonary hypertension. LV EDP is normal.  Left Heart  Left Ventricle LV end diastolic pressure is normal.  Aortic Valve There is moderate aortic valve stenosis. There is no aortic valve regurgitation. The aortic valve is calcified. There is restricted aortic valve motion.  Coronary Diagrams  Diagnostic Diagram     Implants     No implant documentation for this case.  MERGE Images  Link to Procedure Log   Show images for CARDIAC CATHETERIZATION Procedure Log  Hemo Data   Most Recent Value  Fick Cardiac Output 5.44 L/min  Fick Cardiac Output Index 2.75 (L/min)/BSA  Aortic Mean Gradient 17.26 mmHg  Aortic Peak Gradient 21 mmHg  Aortic Valve Area 1.72  Aortic Value Area Index 0.87 cm2/BSA  RA A Wave 8 mmHg  RA V Wave 6 mmHg  RA Mean 5 mmHg  RV Systolic Pressure 34 mmHg  RV Diastolic  Pressure 1 mmHg  RV EDP 8 mmHg  PA Systolic Pressure 33 mmHg  PA Diastolic Pressure 9 mmHg  PA Mean 19 mmHg  PW A Wave 15 mmHg  PW V Wave 15 mmHg  PW Mean 11 mmHg  AO Systolic Pressure 595 mmHg  AO Diastolic Pressure 55 mmHg  AO Mean 77 mmHg  LV Systolic Pressure 638 mmHg  LV Diastolic Pressure 4 mmHg  LV EDP 15 mmHg  AOp Systolic  Pressure 112 mmHg  AOp Diastolic Pressure 56 mmHg  AOp Mean Pressure 77 mmHg  LVp Systolic Pressure 292 mmHg  LVp Diastolic Pressure 4 mmHg  LVp EDP Pressure 15 mmHg  QP/QS 1  TPVR Index 6.91 HRUI  TSVR Index 28.01 HRUI  PVR SVR Ratio 0.11  TPVR/TSVR Ratio 0.25   Impression:   This 77 year old gentleman has severe, symptomatic aortic stenosis with New York Heart Association class Il symptoms of exertional fatigue and shortness of breath consistent with chronic diastolic congestive heart failure. He also has severe single-vessel coronary artery disease with a chronic total occlusion of the ostial right coronary artery with filling of the distal vessel by collaterals from the left. He has had recurrent exertional angina. I agree that aortic valve replacement and coronary bypass graft surgery to the right coronary artery is the best treatment for this patient for improvement of his quality of life and to prevent further left ventricular deterioration. I reviewed his catheterization and an echocardiogram images with him and his wife and answered their questions. I discussed the operative procedure with the patient and family including alternatives, benefits and risks; including but not limited to bleeding, blood transfusion, infection, stroke, myocardial infarction, graft failure, heart block requiring a permanent pacemaker, organ dysfunction, and death. [name] understands and agrees to proceed. Given his age of 52 I would plan to use a bioprosthetic valve.   Plan:   Aortic valve replacement using a bioprosthetic valve and coronary artery bypass graft  surgery x1 to the right coronary artery.    Gaye Pollack, MD  [facility]  [phone]

## 2018-09-18 NOTE — Anesthesia Postprocedure Evaluation (Signed)
Anesthesia Post Note  Patient: JACEION ADAY  Procedure(s) Performed: CORONARY ARTERY BYPASS GRAFTING (CABG) X 1 using right greater saphenous vein harvested endoscopically. (N/A Chest) AORTIC VALVE REPLACEMENT (AVR) using inspiris valve - size 25 (N/A Chest) TRANSESOPHAGEAL ECHOCARDIOGRAM (TEE) (N/A )     Patient location during evaluation: SICU Anesthesia Type: General Level of consciousness: sedated Pain management: pain level controlled Vital Signs Assessment: post-procedure vital signs reviewed and stable Respiratory status: patient remains intubated per anesthesia plan Cardiovascular status: stable Postop Assessment: no apparent nausea or vomiting Anesthetic complications: no    Last Vitals:  Vitals:   09/18/18 1415 09/18/18 1430  BP:  105/76  Pulse: 89 89  Resp: 13 12  Temp: 36.5 C 36.6 C  SpO2: 99% 99%    Last Pain:  Vitals:   09/18/18 0616  TempSrc:   PainSc: 0-No pain                 Bela Nyborg DAVID

## 2018-09-18 NOTE — Plan of Care (Signed)
  Problem: Clinical Measurements: Goal: Will remain free from infection Outcome: Progressing Goal: Diagnostic test results will improve Outcome: Progressing Goal: Respiratory complications will improve Outcome: Progressing Goal: Cardiovascular complication will be avoided Outcome: Progressing   Problem: Safety: Goal: Ability to remain free from injury will improve Outcome: Progressing   Problem: Skin Integrity: Goal: Risk for impaired skin integrity will decrease Outcome: Progressing

## 2018-09-18 NOTE — Interval H&P Note (Signed)
History and Physical Interval Note:  09/18/2018 5:45 AM  Daniel Mendez  has presented today for surgery, with the diagnosis of CAD SEVERE AS  The various methods of treatment have been discussed with the patient and family. After consideration of risks, benefits and other options for treatment, the patient has consented to  Procedure(s): CORONARY ARTERY BYPASS GRAFTING (CABG) X 1 (N/A) AORTIC VALVE REPLACEMENT (AVR) (N/A) TRANSESOPHAGEAL ECHOCARDIOGRAM (TEE) (N/A) as a surgical intervention .  The patient's history has been reviewed, patient examined, no change in status, stable for surgery.  I have reviewed the patient's chart and labs.  Questions were answered to the patient's satisfaction.     Gaye Pollack

## 2018-09-18 NOTE — Anesthesia Procedure Notes (Signed)
Arterial Line Insertion Start/End10/28/2019 6:40 AM, 09/18/2018 7:00 AM Performed by: Lillia Abed, MD, Milford Cage, CRNA, CRNA  Patient location: Pre-op. Preanesthetic checklist: patient identified, IV checked, site marked, risks and benefits discussed, surgical consent, monitors and equipment checked, pre-op evaluation, timeout performed and anesthesia consent Lidocaine 1% used for infiltration Left, radial was placed Catheter size: 20 G Hand hygiene performed , maximum sterile barriers used  and Seldinger technique used Allen's test indicative of satisfactory collateral circulation Attempts: 2 Procedure performed without using ultrasound guided technique. Following insertion, dressing applied and Biopatch. Post procedure assessment: normal and unchanged  Patient tolerated the procedure well with no immediate complications.

## 2018-09-18 NOTE — Anesthesia Procedure Notes (Signed)
Procedure Name: Intubation Date/Time: 09/18/2018 7:46 AM Performed by: Milford Cage, CRNA Pre-anesthesia Checklist: Patient identified, Emergency Drugs available, Suction available and Patient being monitored Patient Re-evaluated:Patient Re-evaluated prior to induction Oxygen Delivery Method: Circle System Utilized Preoxygenation: Pre-oxygenation with 100% oxygen Induction Type: IV induction Ventilation: Mask ventilation without difficulty Laryngoscope Size: Mac and 3 Grade View: Grade I Tube type: Oral Tube size: 8.0 mm Number of attempts: 1 Airway Equipment and Method: Stylet Placement Confirmation: ETT inserted through vocal cords under direct vision,  positive ETCO2 and breath sounds checked- equal and bilateral Secured at: 24 cm Tube secured with: Tape Dental Injury: Teeth and Oropharynx as per pre-operative assessment

## 2018-09-18 NOTE — Anesthesia Preprocedure Evaluation (Addendum)
Anesthesia Evaluation  Patient identified by MRN, date of birth, ID band Patient awake    Reviewed: Allergy & Precautions, NPO status , Patient's Chart, lab work & pertinent test results  History of Anesthesia Complications Negative for: history of anesthetic complications  Airway Mallampati: II  TM Distance: >3 FB Neck ROM: Full    Dental  (+) Edentulous Upper, Edentulous Lower   Pulmonary shortness of breath, sleep apnea ,    breath sounds clear to auscultation       Cardiovascular hypertension, Pt. on medications + angina with exertion + CAD and + Peripheral Vascular Disease  + Valvular Problems/Murmurs AS  Rhythm:Regular     Neuro/Psych negative neurological ROS  negative psych ROS   GI/Hepatic GERD  Medicated and Controlled,  Endo/Other  diabetes, Type 2, Oral Hypoglycemic Agents  Renal/GU      Musculoskeletal  (+) Arthritis ,   Abdominal   Peds  Hematology  (+) anemia ,   Anesthesia Other Findings   Reproductive/Obstetrics                            Anesthesia Physical Anesthesia Plan  ASA: IV  Anesthesia Plan: General   Post-op Pain Management:    Induction: Intravenous  PONV Risk Score and Plan: 2 and Ondansetron and Treatment may vary due to age or medical condition  Airway Management Planned: Oral ETT  Additional Equipment: Arterial line, PA Cath, CVP, TEE and Ultrasound Guidance Line Placement  Intra-op Plan:   Post-operative Plan: Post-operative intubation/ventilation  Informed Consent: I have reviewed the patients History and Physical, chart, labs and discussed the procedure including the risks, benefits and alternatives for the proposed anesthesia with the patient or authorized representative who has indicated his/her understanding and acceptance.     Plan Discussed with: CRNA  Anesthesia Plan Comments:        Anesthesia Quick Evaluation

## 2018-09-18 NOTE — Progress Notes (Signed)
NIF -18, VC 1.6 L

## 2018-09-18 NOTE — Op Note (Signed)
CARDIOVASCULAR SURGERY OPERATIVE NOTE  09/18/2018  Surgeon:  Gaye Pollack, MD  First Assistant: Lars Pinks,  PA-C   Preoperative Diagnosis:  Severe single-vessel coronary artery disease and severe aortic stenosis.   Postoperative Diagnosis:  Same   Procedure:  1. Median Sternotomy 2. Extracorporeal circulation 3.   Coronary artery bypass grafting x 1   SVG to RCA  4.   Endoscopic vein harvest from the right leg 5.   Aortic valve replacement using a 25 mm Edwards INSPIRIS RESILIA pericardial valve.   Anesthesia:  General Endotracheal   Clinical History/Surgical Indication:  This 77 year old gentleman has severe, symptomatic aortic stenosis with New York Heart Association class Il symptoms of exertional fatigue and shortness of breath consistent with chronic diastolic congestive heart failure. He also has severe single-vessel coronary artery disease with a chronic total occlusion of the ostial right coronary artery with filling of the distal vessel by collaterals from the left. He has had recurrent exertional angina. I agree that aortic valve replacement and coronary bypass graft surgery to the right coronary artery is the best treatment for this patient for improvement of his quality of life and to prevent further left ventricular deterioration. I reviewed his catheterization and an echocardiogram images with him and his wife and answered their questions. I discussed the operative procedure with the patient and family including alternatives, benefits and risks; including but not limited to bleeding, blood transfusion, infection, stroke, myocardial infarction, graft failure, heart block requiring a permanent pacemaker, organ dysfunction, and death. [name] understands and agrees to proceed.   Preparation:  The patient was seen in the preoperative holding area and the  correct patient, correct operation were confirmed with the patient after reviewing the medical record and catheterization. The consent was signed by me. Preoperative antibiotics were given. A pulmonary arterial line and radial arterial line were placed by the anesthesia team. The patient was taken back to the operating room and positioned supine on the operating room table. After being placed under general endotracheal anesthesia by the anesthesia team a foley catheter was placed. The neck, chest, abdomen, and both legs were prepped with betadine soap and solution and draped in the usual sterile manner. A surgical time-out was taken and the correct patient and operative procedure were confirmed with the nursing and anesthesia staff.  TEE: performed by Dr. Lillia Abed. This showed a heavily calcified trileaflet aortic valve with moderate to severe stenosis. Restricted leaflet mobility. Normal LV systolic function.    Cardiopulmonary Bypass:  A median sternotomy was performed. The pericardium was opened in the midline. Right ventricular function appeared normal. The ascending aorta was of normal size and had no palpable plaque. There were no contraindications to aortic cannulation or cross-clamping. The patient was fully systemically heparinized and the ACT was maintained > 400 sec. The proximal aortic arch was cannulated with a 20 F aortic cannula for arterial inflow. Venous cannulation was performed via the right atrial appendage using a two-staged venous cannula. An antegrade cardioplegia/vent cannula was inserted into the mid-ascending aorta. A left ventricular vent was placed via the right superior pulmonary vein. A retrograde cardioplegia cannula was placed into the coronary sinus via the right atrium. Aortic occlusion was performed with a single cross-clamp. Systemic cooling to 32 degrees Centigrade and topical cooling of the heart with iced saline were used. Hyperkalemic antegrade cold blood  cardioplegia was used to induce diastolic arrest and was then cold blood retrograde cardioplegia was given at about 20 minute intervals  throughout the period of arrest to maintain myocardial temperature at or below 10 degrees centigrade. A temperature probe was inserted into the interventricular septum and an insulating pad was placed in the pericardium. Carbon dioxide was insufflated into the pericardium to minimize intracardiac air.   Endoscopic vein harvest:  The right greater saphenous vein was harvested endoscopically through a 2 cm incision medial to the right knee. It was harvested from the thigh. It was a medium-sized vein of good quality. The side branches were all ligated with 4-0 silk ties.    Coronary arteries:  The coronary arteries were examined.   LAD:  Mild segmental plaque noted  LCX:  Mild segmental plaque noted in the marginal branches.  RCA:  Moderate sized vessel with thickened walls.   Grafts:  1. SVG to RCA: 1.75 mm. Anastomosed in end to side manner using continuous 7-0 prolene suture.  The proximal vein graft anastomosis was performed to the mid-ascending aorta using continuous 6-0 prolene suture. A graft marker was placed around the proximal anastomosis. The ascending aorta was thick.   Aortic Valve Replacement:   A transverse aortotomy was performed 1 cm above the take-off of the right coronary artery. The native valve was tricuspid with calcified leaflets and mild annular calcification. The ostia of the coronary arteries were in normal position and were not obstructed. The native valve leaflets were excised and the annulus was decalcified with rongeurs. Care was taken to remove all particulate debris. The left ventricle was directly inspected for debris and then irrigated with ice saline solution. The annulus was sized and a size 25 mm Edwards INSPIRIS RESILIA pericardial valve was chosen. The model number was 11500A and the serial number was [id number]. While the  valve was being prepared 2-0 Ethibond pledgeted horizontal mattress sutures were placed around the annulus with the pledgets in a sub-annular position. The sutures were placed through the sewing ring and the valve lowered into place. The sutures were tied sequentially. The valve seated nicely and the coronary ostia were not obstructed. The prosthetic valve leaflets moved normally and there was no sub-valvular obstruction. The aortotomy was closed using 4-0 Prolene suture in 2 layers with felt strips to reinforce the closure.  Completion:  The patient was rewarmed to 37 degrees Centigrade.  The crossclamp was removed with a time of 113 minutes. There was spontaneous return of sinus rhythm. The distal and proximal anastomoses were checked for hemostasis. The position of the graft was satisfactory. Two temporary epicardial pacing wires were placed on the right atrium and two on the right ventricle. The patient was weaned from CPB without difficulty on no inotropes. CPB time was 137 minutes. Cardiac output was 6 LPM. TEE showed a normally functioning aortic vavle prosthesis with no paravalvular leak. Mean pressure across the valve was 5 mm Hg and peak pressure was 9 mm Hg. LV systolic function was normal. Trivial and unchanged MR. Heparin was fully reversed with protamine and the aortic and venous cannulas removed. Hemostasis was achieved. Mediastinal drainage tubes were placed. The sternum was closed with double #6 stainless steel wires. The fascia was closed with continuous # 1 vicryl suture. The subcutaneous tissue was closed with 2-0 vicryl continuous suture. The skin was closed with 3-0 vicryl subcuticular suture. All sponge, needle, and instrument counts were reported correct at the end of the case. Dry sterile dressings were placed over the incisions and around the chest tubes which were connected to pleurevac suction. The patient was then transported to the  surgical intensive care unit in stable  condition.

## 2018-09-18 NOTE — Progress Notes (Signed)
  Echocardiogram Echocardiogram Transesophageal has been performed.  Daniel Mendez 09/18/2018, 2:52 PM

## 2018-09-18 NOTE — Brief Op Note (Signed)
09/18/2018  11:00 AM  PATIENT:  Daniel Mendez  77 y.o. male  PRE-OPERATIVE DIAGNOSIS:  1. Single vessel CAD 2. SEVERE AS  POST-OPERATIVE DIAGNOSIS:  1. Single vessel CAD 2. SEVERE AS  PROCEDURE:  TRANSESOPHAGEAL ECHOCARDIOGRAM (TEE), MEDIAN STERNOTOMY for CORONARY ARTERY BYPASS GRAFTING (CABG) X 1 (SVG to RCA) using right greater saphenous vein harvested endoscopically and AORTIC VALVE REPLACEMENT (AVR) using Inspiris valve (Model # 11500A, Serial # [id number],  size 25)  SURGEON:  Surgeon(s) and Role:    Gaye Pollack, MD - Primary  PHYSICIAN ASSISTANT: Lars Pinks PA-C  ASSISTANTS: Dineen Kid RNFA  ANESTHESIA:   general  EBL: Per anesthesia and perfusion record  DRAINS: Chest tubes placed in the mediastinal and pleural spaces   SPECIMEN:  Source of Specimen:  Native AV leaflets  DISPOSITION OF SPECIMEN:  PATHOLOGY  COUNTS CORRECT:  YES  DICTATION: .Dragon Dictation  PLAN OF CARE: Admit to inpatient   PATIENT DISPOSITION:  ICU - intubated and hemodynamically stable.   Delay start of Pharmacological VTE agent (>24hrs) due to surgical blood loss or risk of bleeding: yes  BASELINE WEIGHT: 81 kg

## 2018-09-18 NOTE — Progress Notes (Addendum)
EVENING ROUNDS NOTE :     [address]       [place] [address]             [phone]                 Day of Surgery Procedure(s) (LRB): CORONARY ARTERY BYPASS GRAFTING (CABG) X 1 using right greater saphenous vein harvested endoscopically. (N/A) AORTIC VALVE REPLACEMENT (AVR) using inspiris valve - size 25 (N/A) TRANSESOPHAGEAL ECHOCARDIOGRAM (TEE) (N/A)  Total Length of Stay:  LOS: 0 days  BP 120/79   Pulse 80   Temp 98.8 F (37.1 C)   Resp 15   SpO2 93%   .Intake/Output      10/27 0701 - 10/28 0700 10/28 0701 - 10/29 0700   I.V.  2658.7   Blood  355   IV Piggyback  489.4   Total Intake  3503.1   Urine  1500   Emesis/NG output  95   Blood  725   Chest Tube  340   Total Output  2660   Net  +843.1          . sodium chloride 20 mL/hr at 09/18/18 1800  . [START ON 09/19/2018] sodium chloride    . sodium chloride 10 mL/hr at 09/18/18 1426  . albumin human Stopped (09/18/18 1618)  . cefUROXime (ZINACEF)  IV Stopped (09/18/18 1655)  . dexmedetomidine (PRECEDEX) IV infusion Stopped (09/18/18 1447)  . famotidine (PEPCID) IV Stopped (09/18/18 1338)  . insulin 3.9 Units/hr (09/18/18 1817)  . lactated ringers    . lactated ringers 10 mL/hr at 09/18/18 1800  . lactated ringers 10 mL/hr at 09/18/18 1800  . magnesium sulfate 20 mL/hr at 09/18/18 1800  . nitroGLYCERIN 5 mcg/min (09/18/18 1752)  . phenylephrine (NEO-SYNEPHRINE) Adult infusion Stopped (09/18/18 1600)  . vancomycin       Lab Results  Component Value Date   WBC 11.4 (H) 09/18/2018   HGB 10.2 (L) 09/18/2018   HCT 30.0 (L) 09/18/2018   PLT 115 (L) 09/18/2018   GLUCOSE 141 (H) 09/18/2018   ALT 25 09/14/2018   AST 23 09/14/2018   NA 138 09/18/2018   K 4.7 09/18/2018   CL 105 09/18/2018   CREATININE 1.00 09/18/2018   BUN 13 09/18/2018   CO2 17 (L) 09/14/2018   INR 1.30 09/18/2018   HGBA1C 6.6 (H) 09/14/2018  Patient extubated, awake and alert. Neuro grossly intact without focal  deficits.  Paced. On Nitro and Insulin drips Chest tube output 340 cc since surgery. Continue present post op management  Lars Pinks PA-C 09/18/2018 6:28 PM  I have seen and examined the patient and agree with the assessment and plan as outlined.  Doing well.  Extubated uneventfully.  Rexene Alberts, MD 09/18/2018 9:05 PM

## 2018-09-19 ENCOUNTER — Inpatient Hospital Stay (HOSPITAL_COMMUNITY): Payer: Medicare Other

## 2018-09-19 ENCOUNTER — Encounter (HOSPITAL_COMMUNITY): Payer: Self-pay | Admitting: Surgery

## 2018-09-19 LAB — GLUCOSE, CAPILLARY
GLUCOSE-CAPILLARY: 96 mg/dL (ref 70–99)
GLUCOSE-CAPILLARY: 107 mg/dL — AB (ref 70–99)
GLUCOSE-CAPILLARY: 103 mg/dL — AB (ref 70–99)
GLUCOSE-CAPILLARY: 137 mg/dL — AB (ref 70–99)
GLUCOSE-CAPILLARY: 154 mg/dL — AB (ref 70–99)
GLUCOSE-CAPILLARY: 167 mg/dL — AB (ref 70–99)
GLUCOSE-CAPILLARY: 142 mg/dL — AB (ref 70–99)
Glucose-Capillary: 111 mg/dL — ABNORMAL HIGH (ref 70–99)
Glucose-Capillary: 106 mg/dL — ABNORMAL HIGH (ref 70–99)
Glucose-Capillary: 103 mg/dL — ABNORMAL HIGH (ref 70–99)
Glucose-Capillary: 108 mg/dL — ABNORMAL HIGH (ref 70–99)
Glucose-Capillary: 99 mg/dL (ref 70–99)
Glucose-Capillary: 103 mg/dL — ABNORMAL HIGH (ref 70–99)
Glucose-Capillary: 125 mg/dL — ABNORMAL HIGH (ref 70–99)
Glucose-Capillary: 123 mg/dL — ABNORMAL HIGH (ref 70–99)
Glucose-Capillary: 215 mg/dL — ABNORMAL HIGH (ref 70–99)

## 2018-09-19 LAB — POCT I-STAT, CHEM 8
BUN: 11 mg/dL (ref 8–23)
Calcium, Ion: 1.29 mmol/L (ref 1.15–1.40)
Chloride: 97 mmol/L — ABNORMAL LOW (ref 98–111)
Creatinine, Ser: 1.2 mg/dL (ref 0.61–1.24)
Glucose, Bld: 189 mg/dL — ABNORMAL HIGH (ref 70–99)
HEMATOCRIT: 31 % — AB (ref 39.0–52.0)
HEMOGLOBIN: 10.5 g/dL — AB (ref 13.0–17.0)
Potassium: 4.6 mmol/L (ref 3.5–5.1)
SODIUM: 133 mmol/L — AB (ref 135–145)
TCO2: 26 mmol/L (ref 22–32)

## 2018-09-19 LAB — BASIC METABOLIC PANEL
ANION GAP: 4 — AB (ref 5–15)
BUN: 11 mg/dL (ref 8–23)
CO2: 24 mmol/L (ref 22–32)
Calcium: 8.5 mg/dL — ABNORMAL LOW (ref 8.9–10.3)
Chloride: 108 mmol/L (ref 98–111)
Creatinine, Ser: 1.06 mg/dL (ref 0.61–1.24)
GLUCOSE: 101 mg/dL — AB (ref 70–99)
POTASSIUM: 4.7 mmol/L (ref 3.5–5.1)
Sodium: 136 mmol/L (ref 135–145)

## 2018-09-19 LAB — CBC
HCT: 31.6 % — ABNORMAL LOW (ref 39.0–52.0)
HEMATOCRIT: 29.6 % — AB (ref 39.0–52.0)
Hemoglobin: 9 g/dL — ABNORMAL LOW (ref 13.0–17.0)
Hemoglobin: 9.8 g/dL — ABNORMAL LOW (ref 13.0–17.0)
MCH: 26.3 pg (ref 26.0–34.0)
MCH: 27.3 pg (ref 26.0–34.0)
MCHC: 30.4 g/dL (ref 30.0–36.0)
MCHC: 31 g/dL (ref 30.0–36.0)
MCV: 86.5 fL (ref 80.0–100.0)
MCV: 88 fL (ref 80.0–100.0)
NRBC: 0 % (ref 0.0–0.2)
NRBC: 0 % (ref 0.0–0.2)
PLATELETS: 109 10*3/uL — AB (ref 150–400)
PLATELETS: 133 10*3/uL — AB (ref 150–400)
RBC: 3.42 MIL/uL — AB (ref 4.22–5.81)
RBC: 3.59 MIL/uL — AB (ref 4.22–5.81)
RDW: 15.2 % (ref 11.5–15.5)
RDW: 15.4 % (ref 11.5–15.5)
WBC: 10.3 10*3/uL (ref 4.0–10.5)
WBC: 14 10*3/uL — ABNORMAL HIGH (ref 4.0–10.5)

## 2018-09-19 LAB — MAGNESIUM
Magnesium: 2.2 mg/dL (ref 1.7–2.4)
Magnesium: 2 mg/dL (ref 1.7–2.4)

## 2018-09-19 LAB — CREATININE, SERUM
CREATININE: 1.27 mg/dL — AB (ref 0.61–1.24)
GFR, EST NON AFRICAN AMERICAN: 52 mL/min — AB (ref >60)

## 2018-09-19 MED ORDER — FUROSEMIDE 10 MG/ML IJ SOLN
40.0000 mg | Freq: Once | INTRAMUSCULAR | Status: AC
  Administered 2018-09-19: 40 mg via INTRAVENOUS
  Filled 2018-09-19: qty 4

## 2018-09-19 MED ORDER — ENOXAPARIN SODIUM 40 MG/0.4ML ~~LOC~~ SOLN
40.0000 mg | Freq: Every day | SUBCUTANEOUS | Status: DC
  Administered 2018-09-19: 40 mg via SUBCUTANEOUS
  Filled 2018-09-19: qty 0.4

## 2018-09-19 MED ORDER — INSULIN ASPART 100 UNIT/ML ~~LOC~~ SOLN
0.0000 [IU] | SUBCUTANEOUS | Status: DC
  Administered 2018-09-19: 2 [IU] via SUBCUTANEOUS
  Administered 2018-09-19: 4 [IU] via SUBCUTANEOUS
  Administered 2018-09-19: 8 [IU] via SUBCUTANEOUS
  Administered 2018-09-20: 2 [IU] via SUBCUTANEOUS

## 2018-09-19 MED ORDER — PANTOPRAZOLE SODIUM 40 MG PO TBEC
40.0000 mg | DELAYED_RELEASE_TABLET | Freq: Every day | ORAL | Status: DC
  Administered 2018-09-19: 40 mg via ORAL
  Filled 2018-09-19: qty 1

## 2018-09-19 MED ORDER — INSULIN DETEMIR 100 UNIT/ML ~~LOC~~ SOLN: 15.0000 [IU] | Freq: Every day | SUBCUTANEOUS | Status: DC

## 2018-09-19 MED ORDER — INSULIN DETEMIR 100 UNIT/ML ~~LOC~~ SOLN
15.0000 [IU] | Freq: Every day | SUBCUTANEOUS | Status: DC
  Administered 2018-09-19: 15 [IU] via SUBCUTANEOUS
  Filled 2018-09-19 (×2): qty 0.15

## 2018-09-19 MED FILL — Heparin Sodium (Porcine) Inj 1000 Unit/ML: INTRAMUSCULAR | Qty: 30 | Status: AC

## 2018-09-19 MED FILL — Magnesium Sulfate Inj 50%: INTRAMUSCULAR | Qty: 10 | Status: AC

## 2018-09-19 MED FILL — Potassium Chloride Inj 2 mEq/ML: INTRAVENOUS | Qty: 40 | Status: AC

## 2018-09-19 NOTE — Progress Notes (Signed)
1 Day Post-Op Procedure(s) (LRB): CORONARY ARTERY BYPASS GRAFTING (CABG) X 1 using right greater saphenous vein harvested endoscopically. (N/A) AORTIC VALVE REPLACEMENT (AVR) using inspiris valve - size 25 (N/A) TRANSESOPHAGEAL ECHOCARDIOGRAM (TEE) (N/A) Subjective: No complaints. Feels better than he thought he would.  Objective: Vital signs in last 24 hours: Temp:  [96.8 F (36 C)-99.1 F (37.3 C)] 98.6 F (37 C) (10/29 0700) Pulse Rate:  [80-90] 89 (10/29 0700) Cardiac Rhythm: Atrial paced (10/29 0400) Resp:  [9-26] 21 (10/29 0700) BP: (89-127)/(45-79) 101/61 (10/29 0700) SpO2:  [93 %-100 %] 96 % (10/29 0700) Arterial Line BP: (52-203)/(32-199) 127/51 (10/29 0700) FiO2 (%):  [40 %-50 %] 40 % (10/28 1715) Weight:  [84.2 kg] 84.2 kg (10/29 0627)  Hemodynamic parameters for last 24 hours: PAP: (26-40)/(14-24) 36/20 CO:  [3.2 L/min-7 L/min] 4.5 L/min CI:  [1.6 L/min/m2-3.5 L/min/m2] 2.2 L/min/m2  Intake/Output from previous day: 10/28 0701 - 10/29 0700 In: 4468.5 [I.V.:3310.7; Blood:355; IV Piggyback:801.8] Out: 3650 [Urine:2140; Emesis/NG output:95; Blood:725; Chest Tube:690] Intake/Output this shift: No intake/output data recorded.  General appearance: alert and cooperative Neurologic: intact Heart: regular rate and rhythm, S1, S2 normal, no murmur, click, rub or gallop Lungs: clear to auscultation bilaterally Extremities: edema minimal Wound: dressings dry  Lab Results: Recent Labs    09/18/18 1805 09/18/18 1822 09/19/18 0413  WBC 13.8*  --  10.3  HGB 10.4* 10.2* 9.0*  HCT 33.5* 30.0* 29.6*  PLT 125*  --  109*   BMET:  Recent Labs    09/18/18 1822 09/19/18 0413  NA 138 136  K 4.7 4.7  CL 105 108  CO2  --  24  GLUCOSE 141* 101*  BUN 13 11  CREATININE 1.00 1.06  CALCIUM  --  8.5*    PT/INR:  Recent Labs    09/18/18 1301  LABPROT 16.0*  INR 1.30   ABG    Component Value Date/Time   PHART 7.323 (L) 09/18/2018 1815   HCO3 23.4 09/18/2018 1815    TCO2 24 09/18/2018 1822   ACIDBASEDEF 3.0 (H) 09/18/2018 1815   O2SAT 94.0 09/18/2018 1815   CBG (last 3)  Recent Labs    09/19/18 0409 09/19/18 0516 09/19/18 0612  GLUCAP 99 103* 107*   CXR: ok  ECG: sinus 75, nonspecific changes.  ee Assessment/Plan: S/P Procedure(s) (LRB): CORONARY ARTERY BYPASS GRAFTING (CABG) X 1 using right greater saphenous vein harvested endoscopically. (N/A) AORTIC VALVE REPLACEMENT (AVR) using inspiris valve - size 25 (N/A) TRANSESOPHAGEAL ECHOCARDIOGRAM (TEE) (N/A)  POD 1 Hemodynamically stable on low dose neo. Wean as tolerated and start low dose Lopressor.  DC chest tubes, swan, arterial line.  Mild volume excess: diurese once off neo.  DM: preop Hgb A1c 6.6 on oral agents. Start Levemir and SSI today and stop insulin drip. Transition to oral agents once eating well.  IS, ambulate.   LOS: 1 day    Gaye Pollack 09/19/2018

## 2018-09-19 NOTE — Progress Notes (Signed)
Patient ID: Daniel Mendez, male   DOB: 06/15/1941, 77 y.o.   MRN: [id number] T[facility] Evening Rounds:  Hemodynamically stable in sinus rhythm.  Up in chair and feels well Diuresing well.

## 2018-09-20 ENCOUNTER — Other Ambulatory Visit: Payer: Self-pay

## 2018-09-20 ENCOUNTER — Inpatient Hospital Stay (HOSPITAL_COMMUNITY): Payer: Medicare Other

## 2018-09-20 LAB — CBC
HEMATOCRIT: 28.8 % — AB (ref 39.0–52.0)
HEMOGLOBIN: 8.7 g/dL — AB (ref 13.0–17.0)
MCH: 26.5 pg (ref 26.0–34.0)
MCHC: 30.2 g/dL (ref 30.0–36.0)
MCV: 87.8 fL (ref 80.0–100.0)
Platelets: 92 10*3/uL — ABNORMAL LOW (ref 150–400)
RBC: 3.28 MIL/uL — ABNORMAL LOW (ref 4.22–5.81)
RDW: 15.4 % (ref 11.5–15.5)
WBC: 10.1 10*3/uL (ref 4.0–10.5)
nRBC: 0 % (ref 0.0–0.2)

## 2018-09-20 LAB — GLUCOSE, CAPILLARY
GLUCOSE-CAPILLARY: 167 mg/dL — AB (ref 70–99)
GLUCOSE-CAPILLARY: 184 mg/dL — AB (ref 70–99)
Glucose-Capillary: 102 mg/dL — ABNORMAL HIGH (ref 70–99)
Glucose-Capillary: 194 mg/dL — ABNORMAL HIGH (ref 70–99)
Glucose-Capillary: 139 mg/dL — ABNORMAL HIGH (ref 70–99)

## 2018-09-20 LAB — BASIC METABOLIC PANEL
Anion gap: 4 — ABNORMAL LOW (ref 5–15)
BUN: 12 mg/dL (ref 8–23)
CHLORIDE: 104 mmol/L (ref 98–111)
CO2: 27 mmol/L (ref 22–32)
CREATININE: 1.14 mg/dL (ref 0.61–1.24)
Calcium: 8.8 mg/dL — ABNORMAL LOW (ref 8.9–10.3)
GFR calc Af Amer: >60 mL/min (ref >60)
GFR calc non Af Amer: 59 mL/min — ABNORMAL LOW (ref >60)
GLUCOSE: 147 mg/dL — AB (ref 70–99)
Potassium: 4.3 mmol/L (ref 3.5–5.1)
SODIUM: 135 mmol/L (ref 135–145)

## 2018-09-20 MED ORDER — INSULIN ASPART 100 UNIT/ML ~~LOC~~ SOLN
0.0000 [IU] | Freq: Three times a day (TID) | SUBCUTANEOUS | Status: DC
  Administered 2018-09-20 (×2): 4 [IU] via SUBCUTANEOUS
  Administered 2018-09-20: 2 [IU] via SUBCUTANEOUS
  Administered 2018-09-21 (×3): 4 [IU] via SUBCUTANEOUS
  Administered 2018-09-22 (×3): 2 [IU] via SUBCUTANEOUS
  Administered 2018-09-23: 4 [IU] via SUBCUTANEOUS

## 2018-09-20 MED ORDER — ONDANSETRON HCL 4 MG PO TABS: 4.0000 mg | ORAL_TABLET | Freq: Four times a day (QID) | ORAL | Status: DC | PRN

## 2018-09-20 MED ORDER — METOPROLOL TARTRATE 12.5 MG HALF TABLET
12.5000 mg | ORAL_TABLET | Freq: Two times a day (BID) | ORAL | Status: DC
  Administered 2018-09-20 – 2018-09-23 (×7): 12.5 mg via ORAL
  Filled 2018-09-20 (×7): qty 1

## 2018-09-20 MED ORDER — AMIODARONE HCL IN DEXTROSE 360-4.14 MG/200ML-% IV SOLN
60.0000 mg/h | INTRAVENOUS | Status: AC
  Administered 2018-09-20 – 2018-09-21 (×2): 60 mg/h via INTRAVENOUS

## 2018-09-20 MED ORDER — FUROSEMIDE 40 MG PO TABS
40.0000 mg | ORAL_TABLET | Freq: Every day | ORAL | Status: DC
  Administered 2018-09-20: 40 mg via ORAL
  Filled 2018-09-20: qty 1

## 2018-09-20 MED ORDER — AMIODARONE HCL 200 MG PO TABS: 200.0000 mg | ORAL_TABLET | Freq: Every day | ORAL | Status: DC

## 2018-09-20 MED ORDER — SODIUM CHLORIDE 0.9% FLUSH
3.0000 mL | Freq: Two times a day (BID) | INTRAVENOUS | Status: DC
  Administered 2018-09-20 – 2018-09-22 (×5): 3 mL via INTRAVENOUS

## 2018-09-20 MED ORDER — BISACODYL 10 MG RE SUPP: 10.0000 mg | Freq: Every day | RECTAL | Status: DC | PRN

## 2018-09-20 MED ORDER — SODIUM CHLORIDE 0.9% FLUSH: 3.0000 mL | INTRAVENOUS | Status: DC | PRN

## 2018-09-20 MED ORDER — AMIODARONE LOAD VIA INFUSION: 150.0000 mg | Freq: Once | INTRAVENOUS | Status: DC

## 2018-09-20 MED ORDER — AMIODARONE LOAD VIA INFUSION
150.0000 mg | Freq: Once | INTRAVENOUS | Status: AC
  Administered 2018-09-20: 150 mg via INTRAVENOUS

## 2018-09-20 MED ORDER — AMIODARONE HCL IN DEXTROSE 360-4.14 MG/200ML-% IV SOLN
INTRAVENOUS | Status: AC
  Filled 2018-09-20: qty 200

## 2018-09-20 MED ORDER — POTASSIUM CHLORIDE CRYS ER 20 MEQ PO TBCR
20.0000 meq | EXTENDED_RELEASE_TABLET | Freq: Every day | ORAL | Status: DC
  Administered 2018-09-20: 20 meq via ORAL
  Filled 2018-09-20: qty 1

## 2018-09-20 MED ORDER — AMIODARONE HCL 200 MG PO TABS
200.0000 mg | ORAL_TABLET | Freq: Two times a day (BID) | ORAL | Status: DC
  Administered 2018-09-21 – 2018-09-23 (×5): 200 mg via ORAL
  Filled 2018-09-20 (×5): qty 1

## 2018-09-20 MED ORDER — AMIODARONE HCL IN DEXTROSE 360-4.14 MG/200ML-% IV SOLN
30.0000 mg/h | INTRAVENOUS | Status: DC
  Administered 2018-09-21 (×2): 30 mg/h via INTRAVENOUS
  Filled 2018-09-20 (×2): qty 200

## 2018-09-20 MED ORDER — ASPIRIN EC 325 MG PO TBEC
325.0000 mg | DELAYED_RELEASE_TABLET | Freq: Every day | ORAL | Status: DC
  Administered 2018-09-20 – 2018-09-23 (×4): 325 mg via ORAL
  Filled 2018-09-20 (×4): qty 1

## 2018-09-20 MED ORDER — BISACODYL 5 MG PO TBEC
10.0000 mg | DELAYED_RELEASE_TABLET | Freq: Every day | ORAL | Status: DC | PRN
  Administered 2018-09-21 – 2018-09-22 (×2): 10 mg via ORAL
  Filled 2018-09-20 (×2): qty 2

## 2018-09-20 MED ORDER — OXYCODONE HCL 5 MG PO TABS
5.0000 mg | ORAL_TABLET | ORAL | Status: DC | PRN
  Administered 2018-09-20 – 2018-09-21 (×2): 10 mg via ORAL
  Filled 2018-09-20 (×2): qty 2

## 2018-09-20 MED ORDER — ACETAMINOPHEN 325 MG PO TABS
650.0000 mg | ORAL_TABLET | Freq: Four times a day (QID) | ORAL | Status: DC | PRN
  Administered 2018-09-22 (×2): 650 mg via ORAL
  Filled 2018-09-20 (×2): qty 2

## 2018-09-20 MED ORDER — ONDANSETRON HCL 4 MG/2ML IJ SOLN: 4.0000 mg | Freq: Four times a day (QID) | INTRAMUSCULAR | Status: DC | PRN

## 2018-09-20 MED ORDER — SODIUM CHLORIDE 0.9 % IV SOLN: 250.0000 mL | INTRAVENOUS | Status: DC | PRN

## 2018-09-20 MED ORDER — PANTOPRAZOLE SODIUM 40 MG PO TBEC
40.0000 mg | DELAYED_RELEASE_TABLET | Freq: Every day | ORAL | Status: DC
  Administered 2018-09-20 – 2018-09-23 (×4): 40 mg via ORAL
  Filled 2018-09-20 (×4): qty 1

## 2018-09-20 MED ORDER — MOVING RIGHT ALONG BOOK
Freq: Once | Status: AC
  Administered 2018-09-20: 09:00:00
  Filled 2018-09-20: qty 1

## 2018-09-20 MED ORDER — METFORMIN HCL 500 MG PO TABS
1000.0000 mg | ORAL_TABLET | Freq: Two times a day (BID) | ORAL | Status: DC
  Administered 2018-09-20 – 2018-09-23 (×7): 1000 mg via ORAL
  Filled 2018-09-20 (×7): qty 2

## 2018-09-20 MED ORDER — INSULIN DETEMIR 100 UNIT/ML ~~LOC~~ SOLN
10.0000 [IU] | Freq: Every day | SUBCUTANEOUS | Status: DC
  Administered 2018-09-20 – 2018-09-23 (×4): 10 [IU] via SUBCUTANEOUS
  Filled 2018-09-20 (×4): qty 0.1

## 2018-09-20 MED ORDER — DOCUSATE SODIUM 100 MG PO CAPS
200.0000 mg | ORAL_CAPSULE | Freq: Every day | ORAL | Status: DC
  Administered 2018-09-20 – 2018-09-21 (×2): 200 mg via ORAL
  Filled 2018-09-20 (×3): qty 2

## 2018-09-20 NOTE — Progress Notes (Signed)
2 Days Post-Op Procedure(s) (LRB): CORONARY ARTERY BYPASS GRAFTING (CABG) X 1 using right greater saphenous vein harvested endoscopically. (N/A) AORTIC VALVE REPLACEMENT (AVR) using inspiris valve - size 25 (N/A) TRANSESOPHAGEAL ECHOCARDIOGRAM (TEE) (N/A) Subjective:  Sore but overall feels good. Ambulating well  Objective: Vital signs in last 24 hours: Temp:  [98.3 F (36.8 C)-98.8 F (37.1 C)] 98.3 F (36.8 C) (10/30 0400) Pulse Rate:  [60-89] 75 (10/30 0400) Cardiac Rhythm: Normal sinus rhythm;Heart block (10/29 2000) Resp:  [13-24] 18 (10/30 0400) BP: (94-152)/(53-67) 131/56 (10/30 0400) SpO2:  [87 %-98 %] 95 % (10/30 0400) Arterial Line BP: (101-144)/(43-55) 140/52 (10/29 1000) Weight:  [82.7 kg] 82.7 kg (10/30 0500)  Hemodynamic parameters for last 24 hours: PAP: (36-47)/(17-24) 45/17 CO:  [5.6 L/min] 5.6 L/min CI:  [2.8 L/min/m2] 2.8 L/min/m2  Intake/Output from previous day: 10/29 0701 - 10/30 0700 In: 855.4 [P.O.:480; I.V.:275.2; IV Piggyback:100.2] Out: 1380 [Urine:1300; Chest Tube:80] Intake/Output this shift: No intake/output data recorded.  General appearance: alert and cooperative Neurologic: intact Heart: regular rate and rhythm, S1, S2 normal, no murmur, click, rub or gallop Lungs: clear to auscultation bilaterally Extremities: extremities normal, atraumatic, no cyanosis or edema Wound: dressing dry  Lab Results: Recent Labs    09/19/18 1645 09/19/18 1650 09/20/18 0448  WBC 14.0*  --  10.1  HGB 9.8* 10.5* 8.7*  HCT 31.6* 31.0* 28.8*  PLT 133*  --  92*   BMET:  Recent Labs    09/19/18 0413  09/19/18 1650 09/20/18 0448  NA 136  --  133* 135  K 4.7  --  4.6 4.3  CL 108  --  97* 104  CO2 24  --   --  27  GLUCOSE 101*  --  189* 147*  BUN 11  --  11 12  CREATININE 1.06   < > 1.20 1.14  CALCIUM 8.5*  --   --  8.8*   < > = values in this interval not displayed.    PT/INR:  Recent Labs    09/18/18 1301  LABPROT 16.0*  INR 1.30   ABG    Component Value Date/Time   PHART 7.323 (L) 09/18/2018 1815   HCO3 23.4 09/18/2018 1815   TCO2 26 09/19/2018 1650   ACIDBASEDEF 3.0 (H) 09/18/2018 1815   O2SAT 94.0 09/18/2018 1815   CBG (last 3)  Recent Labs    09/19/18 2006 09/19/18 2327 09/20/18 0335  GLUCAP 215* 142* 102*   CXR: pending  Assessment/Plan: S/P Procedure(s) (LRB): CORONARY ARTERY BYPASS GRAFTING (CABG) X 1 using right greater saphenous vein harvested endoscopically. (N/A) AORTIC VALVE REPLACEMENT (AVR) using inspiris valve - size 25 (N/A) TRANSESOPHAGEAL ECHOCARDIOGRAM (TEE) (N/A)  POD 2 Hemodynamically stable in sinus rhythm. Continue low dose beta blocker.  Continue diuresis for a few more days.  Thrombocytopenia likely related to bypass and heparin. Will hold off on Lovenox.   DM: glucose under adequate control. Will resume Metformin and continue Levemir at 10 units with SSI. Resume other oral meds when eating better.  Transfer to 4E and continue mobilization.   LOS: 2 days    Gaye Pollack 09/20/2018

## 2018-09-20 NOTE — Discharge Instructions (Signed)
Discharge Instructions:  1. You may shower, please wash incisions daily with soap and water and keep dry.  If you wish to cover wounds with dressing you may do so but please keep clean and change daily.  No tub baths or swimming until incisions have completely healed.  If your incisions become red or develop any drainage please call our office at [phone]  2. No Driving until cleared by Dr. Vivi Martens office and you are no longer using narcotic pain medications  3. Monitor your weight daily.. Please use the same scale and weigh at same time... If you gain 5-10 lbs in 48 hours with associated lower extremity swelling, please contact our office at [phone]  4. Fever of 101.5 for at least 24 hours with no source, please contact our office at [phone]  5. Activity- up as tolerated, please walk at least 3 times per day.  Avoid strenuous activity, no lifting, pushing, or pulling with your arms over 8-10 lbs for a minimum of 6 weeks  6. If any questions or concerns arise, please do not hesitate to contact our office at [phone]   Coronary Artery Bypass Grafting, Care After These instructions give you information on caring for yourself after your procedure. Your doctor may also give you more specific instructions. Call your doctor if you have any problems or questions after your procedure. Follow these instructions at home:  Only take medicine as told by your doctor. Take medicines exactly as told. Do not stop taking medicines or start any new medicines without talking to your doctor first.  Take your pulse as told by your doctor.  Do deep breathing as told by your doctor. Use your breathing device (incentive spirometer), if given, to practice deep breathing several times a day. Support your chest with a pillow or your arms when you take deep breaths or cough.  Keep the area clean, dry, and protected where the surgery cuts (incisions) were made. Remove bandages (dressings) only as told  by your doctor. If strips were applied to surgical area, do not take them off. They fall off on their own.  Check the surgery area daily for puffiness (swelling), redness, or leaking fluid.  If surgery cuts were made in your legs: ? Avoid crossing your legs. ? Avoid sitting for long periods of time. Change positions every 30 minutes. ? Raise your legs when you are sitting. Place them on pillows.  Wear stockings that help keep blood clots from forming in your legs (compression stockings).  Only take sponge baths until your doctor says it is okay to take showers. Pat the surgery area dry. Do not rub the surgery area with a washcloth or towel. Do not bathe, swim, or use a hot tub until your doctor says it is okay.  Eat foods that are high in fiber. These include raw fruits and vegetables, whole grains, beans, and nuts. Choose lean meats. Avoid canned, processed, and fried foods.  Drink enough fluids to keep your pee (urine) clear or pale yellow.  Weigh yourself every day.  Rest and limit activity as told by your doctor. You may be told to: ? Stop any activity if you have chest pain, shortness of breath, changes in heartbeat, or dizziness. Get help right away if this happens. ? Move around often for short amounts of time or take short walks as told by your doctor. Gradually become more active. You may need help to strengthen your muscles and build endurance. ? Avoid lifting, pushing, or pulling anything  heavier than 10 pounds (4.5 kg) for at least 6 weeks after surgery.  Do not drive until your doctor says it is okay.  Ask your doctor when you can go back to work.  Ask your doctor when you can begin sexual activity again.  Follow up with your doctor as told. Contact a doctor if:  You have puffiness, redness, more pain, or fluid draining from the incision site.  You have a fever.  You have puffiness in your ankles or legs.  You have pain in your legs.  You gain 2 or more pounds  (0.9 kg) a day.  You feel sick to your stomach (nauseous) or throw up (vomit).  You have watery poop (diarrhea). Get help right away if:  You have chest pain that goes to your jaw or arms.  You have shortness of breath.  You have a fast or irregular heartbeat.  You notice a "clicking" in your breastbone when you move.  You have numbness or weakness in your arms or legs.  You feel dizzy or light-headed. This information is not intended to replace advice given to you by your health care provider. Make sure you discuss any questions you have with your health care provider. Document Released: 11/13/2013 Document Revised: 04/15/2016 Document Reviewed: 04/17/2013 Elsevier Interactive Patient Education  2017 [employer].

## 2018-09-20 NOTE — Progress Notes (Addendum)
Patient's HR up to 180's. In to room, patient was sitting up on the side of the bed trying to take his gown off. Thought he was at home and taking off his clothes for bed. Monitor shows patient in A fib. EKG done. Patient asymptomatic with no chest pain. BP 108/74. EKG shows A fib with RVR. Will continue to monitor and notify MD  2239: Dr. Servando Snare notified. New orders received.

## 2018-09-20 NOTE — Discharge Summary (Signed)
Physician Discharge Summary       [address]       [place], [address]             [phone]    Patient ID: Daniel Mendez MRN: [id number] DOB/AGE: 79-Jun-1942 79 y.o.  Admit date: 09/18/2018 Discharge date: 09/23/2018  Admission Diagnoses: 1. Single vessel coronary artery disease 2. Severe aortic stenosis  Discharge Diagnoses:  1. S/P CABG x 1 2. S/P aortic valve replacement with bioprosthetic valve 3. Post op a fib-converted to SR 4. ABL anemia 5. Thrombocytopenia 6. History of Diabetes mellitus type 2(HCC) 7. History of HTN (hypertension) 8. History of Hyperlipidemia 9. History of Peripheral vascular disease ([place]) 10. History of Sleep apnea-not on CPAP 11. History of GERD (gastroesophageal reflux disease) 12. History of Hypercalcemia 13. History of hiatal hernia 14. History of kidney stones  Procedure (s):  1. Median Sternotomy 2. Extracorporeal circulation 3.   Coronary artery bypass grafting x 1   SVG to RCA  4.   Endoscopic vein harvest from the right leg 5.   Aortic valve replacement using a 25 mm Edwards INSPIRIS RESILIA pericardial valve by Dr. Cyndia Bent on 09/18/2018.  History of Presenting Illness: The patient is a 77 year old gentleman with a history of hypertension, hyperlipidemia, type 2 diabetes, cerebrovascular disease status post left carotid endarterectomy, coronary disease with known ostial/proximal occlusion of the right coronary artery by catheterization in 06/2017, and severe aortic stenosis. 2D echocardiogram in 07/2017 showed a mean gradient across aortic valve of 22 mmHg with a dimensionless index of 0.38 and a valve area of 1.18 cm. His cardiac catheterization at that time showed chronic total occlusion of the RCA as mentioned above with collaterals from the left coronary system. There were luminal irregularities in the proximal and mid LAD without significant stenosis. The left circumflex had no significant disease. He was  continued on medical therapy for exertional angina. A follow-up echocardiogram on 08/14/2018 shows progression of his aortic stenosis with a peak velocity of 4 m/s corresponding to a mean gradient of 39 mmHg with a dimensionless index of 0.3. The aortic valve area was measured at 1.15 cm. Left ventricular ejection fraction was 60 to 65%. Cardiac catheterization was performed again on 09/01/2018. The gradient across aortic valve was measured at 21 mmHg. The chronic total occlusion of the right coronary artery was unchanged. The LAD and left circumflex had nonobstructive disease.  The patient is here today with his wife. He reports increasing exertional shortness of breath and chest tightness with activity. He has not been able to walk as far as he was 6 months ago and has difficulty doing his yard work. He denies any dizziness or syncope. He has not had any orthopnea.   This 77 year old gentleman has severe, symptomatic aortic stenosis with New York Heart Association class Il symptoms of exertional fatigue and shortness of breath consistent with chronic diastolic congestive heart failure. He also has severe single-vessel coronary artery disease with a chronic total occlusion of the ostial right coronary artery with filling of the distal vessel by collaterals from the left. He has had recurrent exertional angina. I agree that aortic valve replacement and coronary bypass graft surgery to the right coronary artery is the best treatment for this patient for improvement of his quality of life and to prevent further left ventricular deterioration. Dr. Cyndia Bent  reviewed his catheterization and an echocardiogram images with him and his wife and answered their questions. Dr. Cyndia Bent discussed the operative procedure  with the patient and family including alternatives, benefits and risks. Patient agreed to proceed with surgery. Pre operative carotid duplex US showed no significant internal carotid artery stenosis bilaterally.  He underwent a CABG x 1 and AVR on 09/18/2018.  Brief Hospital Course:  The patient was extubated the evening of surgery without difficulty. He remained afebrile and hemodynamically stable. Gordy Councilman, a line, chest tubes, and foley were removed early in the post operative course. Lopressor was started and titrated accordingly. He was volume over loaded and diuresed. He had ABL anemia. He did not require a post op transfusion. Last H and H was 8.7 and 28.8.  He had thrombocytopenia. His last platelet count was 92,000. He was weaned off the insulin drip.  Once he was tolerating a diet, home diabetic medicines (Metformin, Glimepiride, and Sitagliptin) were restarted.  The patient's glucose remained well controlled on Metformin 1000 mg bid, Glimeperide 4 mg daily, and Linagliptin (substitute for Sitagliptin). The patient's HGA1C pre op was  6.6. The patient was felt surgically stable for transfer from the ICU to PCTU for further convalescence on 09/20/2018. He had mildly elevated creatinine post op. It went as high as 1.36;however on 11/02, it was down to 1.21 . He continues to maintain sinus rhythm. He continues to progress with cardiac rehab. He was ambulating on room air. He has been tolerating a diet and has had a bowel movement. Epicardial pacing wires were removed on 09/22/2018. Chest tube sutures will be removed in the office after discharge. As discussed with Dr. Cyndia Bent, the patient is felt surgically stable for discharge today.   Latest Vital Signs: Blood pressure (!) 155/69, pulse 70, temperature 98.2 F (36.8 C), temperature source Oral, resp. rate (!) 21, height 5\' 10"  (1.778 m), weight 80.1 kg, SpO2 95 %.  Physical Exam: Cardiovascular: RRR Pulmonary: Clear to auscultation bilaterally Abdomen: Soft, non tender, bowel sounds present. Extremities: Trace bilateral lower extremity edema. Wounds: Sternal and right lower extremity  wounds are clean and dry.  No erythema or signs of  infection.   Discharge Condition: Stable and discharged to home.  Recent laboratory studies:  Lab Results  Component Value Date   WBC 10.1 09/20/2018   HGB 8.7 (L) 09/20/2018   HCT 28.8 (L) 09/20/2018   MCV 87.8 09/20/2018   PLT 92 (L) 09/20/2018   Lab Results  Component Value Date   NA 137 09/23/2018   K 3.9 09/23/2018   CL 107 09/23/2018   CO2 25 09/23/2018   CREATININE 1.21 09/23/2018   GLUCOSE 164 (H) 09/23/2018    Diagnostic Studies: Dg Chest 2 View  Result Date: 09/14/2018 CLINICAL DATA:  Preoperative evaluation for heart surgery, history diabetes mellitus, hypertension, coronary artery disease EXAM: CHEST - 2 VIEW COMPARISON:  09/25/2014 FINDINGS: Normal heart size, mediastinal contours, and pulmonary vascularity. Linear scarring LEFT upper lobe. Lungs otherwise clear. No acute infiltrate, pleural effusion or pneumothorax. Bones unremarkable. IMPRESSION: Linear scarring LEFT upper lobe. No acute abnormalities. Electronically Signed   By: Lavonia Dana M.D.   On: 09/14/2018 16:41   Dg Chest Port 1 View  Result Date: 09/20/2018 CLINICAL DATA:  Status post CABG and aortic valve replacement 2 days ago. Interval removal of the chest tubes. EXAM: PORTABLE CHEST 1 VIEW COMPARISON:  Portable chest x-ray of September 19, 2018 FINDINGS: The lungs are well-expanded. There is decreasing atelectasis at the left lung base. There is no pneumothorax or pleural effusion. The cardiac silhouette is enlarged. The pulmonary vascularity is normal. There is  calcification in the wall of the aortic arch. The sternal wires are intact. The right internal jugular Cordis sheath tip projects over the proximal SVC with air having been interval removal of the Swan-Ganz catheter. The observed bony thorax exhibits no acute abnormality. IMPRESSION: No evidence of pleural effusion or pneumothorax since chest tube removal. Improving left basilar atelectasis. No pulmonary edema. Electronically Signed   By: David   Martinique M.D.   On: 09/20/2018 10:46   Vas US Doppler Pre Cabg  Result Date: 09/14/2018 PREOPERATIVE VASCULAR EVALUATION  Indications:  Pre-surgical evaluation. Risk Factors: Hypertension, hyperlipidemia, Diabetes. Performing Technologist: Darlina Sicilian, RDCS Supporting Technologist: Maudry Mayhew MHA, RDMS, RVT, RDCS  Examination Guidelines: A complete evaluation includes B-mode imaging, spectral Doppler, color Doppler, and power Doppler as needed of all accessible portions of each vessel. Bilateral testing is considered an integral part of a complete examination. Limited examinations for reoccurring indications may be performed as noted.  Right Carotid Findings: +----------+--------+-------+--------+----------------------+------------------+           PSV cm/sEDV    StenosisDescribe              Comments                             cm/s                                                    +----------+--------+-------+--------+----------------------+------------------+ CCA Prox  106     2                                                       +----------+--------+-------+--------+----------------------+------------------+ CCA Distal82      9                                    intimal thickening +----------+--------+-------+--------+----------------------+------------------+ ICA Prox  138     29             smooth and                                                                heterogenous                             +----------+--------+-------+--------+----------------------+------------------+ ICA Distal106     28                                                      +----------+--------+-------+--------+----------------------+------------------+ ECA       110     2              smooth and  heterogenous                              +----------+--------+-------+--------+----------------------+------------------+ Portions of this table do not appear on this page. +----------+--------+-------+----------------+------------+           PSV cm/sEDV cmsDescribe        Arm Pressure +----------+--------+-------+----------------+------------+ Subclavian202            Multiphasic, [id number]          +----------+--------+-------+----------------+------------+ +---------+--------+--+--------+--+---------+ VertebralPSV cm/s86EDV cm/s16Antegrade +---------+--------+--+--------+--+---------+ Left Carotid Findings: +----------+--------+--------+--------+-----------------------+--------+           PSV cm/sEDV cm/sStenosisDescribe               Comments +----------+--------+--------+--------+-----------------------+--------+ CCA Prox  149     14                                              +----------+--------+--------+--------+-----------------------+--------+ CCA Distal122     14                                              +----------+--------+--------+--------+-----------------------+--------+ ICA Prox  43      11              smooth and heterogenous         +----------+--------+--------+--------+-----------------------+--------+ ICA Distal64      16                                              +----------+--------+--------+--------+-----------------------+--------+ ECA       89      4                                               +----------+--------+--------+--------+-----------------------+--------+ +----------+--------+--------+----------------+------------+ SubclavianPSV cm/sEDV cm/sDescribe        Arm Pressure +----------+--------+--------+----------------+------------+           165     137     Multiphasic, WNL161          +----------+--------+--------+----------------+------------+ +---------+--------+--+--------+--+---------+ VertebralPSV cm/s53EDV cm/s14Antegrade  +---------+--------+--+--------+--+---------+  ABI Findings: +--------+------------------+-----+---------+--------+ Right   Rt Pressure (mmHg)IndexWaveform Comment  +--------+------------------+-----+---------+--------+ [id number]                    triphasic         +--------+------------------+-----+---------+--------+ PTA     97                0.53 biphasic          +--------+------------------+-----+---------+--------+ DP      138               0.76 triphasic         +--------+------------------+-----+---------+--------+ +--------+------------------+-----+---------+-------+ Left    Lt Pressure (mmHg)IndexWaveform Comment +--------+------------------+-----+---------+-------+ [id number]                    triphasic        +--------+------------------+-----+---------+-------+ PTA     145  0.80 triphasic        +--------+------------------+-----+---------+-------+ DP      112                    triphasic        +--------+------------------+-----+---------+-------+ +-------+---------------+----------------+ ABI/TBIToday's ABI/TBIPrevious ABI/TBI +-------+---------------+----------------+ Right  0.76                            +-------+---------------+----------------+ Left   0.80                            +-------+---------------+----------------+  Right Doppler Findings: +-----------+--------+-----+---------+--------------------+ Site       PressureIndexDoppler  Comments             +-----------+--------+-----+---------+--------------------+ Brachial   182          triphasic                     +-----------+--------+-----+---------+--------------------+ Radial                  triphasic                     +-----------+--------+-----+---------+--------------------+ Ulnar                   triphasic                     +-----------+--------+-----+---------+--------------------+ Palmar Arch                       Within normal limits +-----------+--------+-----+---------+--------------------+  Left Doppler Findings: +-----------+--------+-----+---------+--------------------+ Site       PressureIndexDoppler  Comments             +-----------+--------+-----+---------+--------------------+ Subclavian                                            +-----------+--------+-----+---------+--------------------+ Axillary                                              +-----------+--------+-----+---------+--------------------+ Brachial   161          triphasic                     +-----------+--------+-----+---------+--------------------+ Forearm                                               +-----------+--------+-----+---------+--------------------+ Radial                  triphasic                     +-----------+--------+-----+---------+--------------------+ Ulnar                   triphasic                     +-----------+--------+-----+---------+--------------------+ Palmar Arch                      Within normal limits +-----------+--------+-----+---------+--------------------+ Digit                                                 +-----------+--------+-----+---------+--------------------+  Summary: Right Carotid: Velocities in the right ICA are consistent with a 1-39% stenosis. Left Carotid: Velocities in the left ICA are consistent with a 1-39% stenosis. Vertebrals:  Bilateral vertebral arteries demonstrate antegrade flow. Subclavians: Normal flow hemodynamics were seen in bilateral subclavian              arteries. Right ABI: Resting right ankle-brachial index indicates mild right lower extremity arterial disease. Left ABI: Resting left ankle-brachial index indicates moderate left lower extremity arterial disease.   Electronically signed by Servando Snare MD on 09/14/2018 at 3:58:05 PM.    Final        Discharge Instructions    Amb Referral to Cardiac Rehabilitation    Complete by:  As directed    Diagnosis:   CABG Valve Replacement     Valve:  Aortic   CABG X ___:  1      Discharge Medications: Allergies as of 09/23/2018   No Known Allergies     Medication List    STOP taking these medications   ranolazine 1000 MG SR tablet Commonly known as:  RANEXA     TAKE these medications   acetaminophen 325 MG tablet Commonly known as:  TYLENOL Take 2 tablets (650 mg total) by mouth every 6 (six) hours as needed for mild pain.   amiodarone 200 MG tablet Commonly known as:  PACERONE Take 1 tablet (200 mg total) by mouth every 12 (twelve) hours. For 5 days;then take 200 mg daily thereafter   aspirin 325 MG EC tablet Take 1 tablet (325 mg total) by mouth daily. What changed:    medication strength  how much to take  when to take this   atorvastatin 20 MG tablet Commonly known as:  LIPITOR Take 20 mg by mouth daily at 12 noon.   cyclobenzaprine 5 MG tablet Commonly known as:  FLEXERIL Take 2.5 mg by mouth daily as needed for muscle spasms.   Fish Oil 1000 MG Caps Take 1,000 mg by mouth 2 (two) times daily.   glimepiride 4 MG tablet Commonly known as:  AMARYL Take 4 mg by mouth daily with breakfast.   metFORMIN 500 MG tablet Commonly known as:  GLUCOPHAGE Take 1,000 mg by mouth 2 (two) times daily.   metoprolol succinate 25 MG 24 hr tablet Commonly known as:  TOPROL-XL Take 1 tablet (25 mg total) by mouth daily. What changed:  how much to take   omeprazole 20 MG capsule Commonly known as:  PRILOSEC Take 20 mg by mouth every other day. At noon   ONE TOUCH ULTRA TEST test strip Generic drug:  glucose blood 1 each by Other route daily.   ONETOUCH DELICA LANCETS 74Q Misc 1 each by Other route daily.   sitaGLIPtin 100 MG tablet Commonly known as:  JANUVIA Take 100 mg by mouth daily with lunch.   tamsulosin 0.4 MG Caps capsule Commonly known as:  FLOMAX Take 0.4 mg by mouth every evening.   telmisartan 40 MG  tablet Commonly known as:  MICARDIS Take 1 tablet (40 mg total) by mouth daily with lunch. What changed:    medication strength  how much to take   testosterone cypionate 200 MG/ML injection Commonly known as:  DEPOTESTOSTERONE CYPIONATE Inject 200 mg into the muscle every 14 (fourteen) days.   traMADol 50 MG tablet Commonly known as:  ULTRAM Take 50 mg by mouth every 4-6 hours PRN severe pain      The patient has been discharged on:  1.Beta  Blocker:  Yes [ x  ]                              No   [   ]                              If No, reason:  2.Ace Inhibitor/ARB: Yes [  x ]                                     No  [    ]                                     If No, reason:  3.Statin:   Yes [  x ]                  No  [   ]                  If No, reason:  4.Ecasa:  Yes  [ x  ]                  No   [   ]                  If No, reason:  Follow Up Appointments: Follow-up Information    Gaye Pollack, MD. Go on 10/30/2018.   Specialty:  Cardiothoracic Surgery Why:  PA/LAT CXR to be taken (at [facility] which is in the same building as Dr. Vivi Martens office) on 10/30/2018 at 10:30 am;Appointment time is at 11:00 am Contact information: [place] [address] [phone]        Burtis Junes, NP. Go on 10/11/2018.   Specialties:  Nurse Practitioner, Interventional Cardiology, Cardiology, Radiology Why:  Appointment time is at 11:00 am Contact information: [place]. [address] [place] [address] [phone]           Signed: Sharalyn Ink [facility] 09/23/2018, 12:59 PM

## 2018-09-20 NOTE — Progress Notes (Signed)
  Amiodarone Drug - Drug Interaction Consult Note  Recommendations: MONITOR HR, K+, AND SIGNS OF MYOPATHY.  Amiodarone is metabolized by the cytochrome P450 system and therefore has the potential to cause many drug interactions. Amiodarone has an average plasma half-life of 50 days (range 20 to 100 days).   There is potential for drug interactions to occur several weeks or months after stopping treatment and the onset of drug interactions may be slow after initiating amiodarone.   [x]  Statins: (ATORVASTATIN) Increased risk of myopathy. Simvastatin- restrict dose to 20mg  daily. Other statins: counsel patients to report any muscle pain or weakness immediately.  []  Anticoagulants: Amiodarone can increase anticoagulant effect. Consider warfarin dose reduction. Patients should be monitored closely and the dose of anticoagulant altered accordingly, remembering that amiodarone levels take several weeks to stabilize.  []  Antiepileptics: Amiodarone can increase plasma concentration of phenytoin, the dose should be reduced. Note that small changes in phenytoin dose can result in large changes in levels. Monitor patient and counsel on signs of toxicity.  [x]  Beta blockers: (LOPRESSOR) increased risk of bradycardia, AV block and myocardial depression. Sotalol - avoid concomitant use.  []   Calcium channel blockers (diltiazem and verapamil): increased risk of bradycardia, AV block and myocardial depression.  []   Cyclosporine: Amiodarone increases levels of cyclosporine. Reduced dose of cyclosporine is recommended.  []  Digoxin dose should be halved when amiodarone is started.  [x]  Diuretics: (LASIX x3) increased risk of cardiotoxicity if hypokalemia occurs.  []  Oral hypoglycemic agents (glyburide, glipizide, glimepiride): increased risk of hypoglycemia. Patient's glucose levels should be monitored closely when initiating amiodarone therapy.   []  Drugs that prolong the QT interval:  Torsades de pointes  risk may be increased with concurrent use - avoid if possible.  Monitor QTc, also keep magnesium/potassium WNL if concurrent therapy can't be avoided. [name] Kitchen Antibiotics: e.g. fluoroquinolones, erythromycin. . Antiarrhythmics: e.g. quinidine, procainamide, disopyramide, sotalol. . Antipsychotics: e.g. phenothiazines, haloperidol.  . Lithium, tricyclic antidepressants, and methadone.  Thank You, Wynona Neat, PharmD, BCPS 09/20/2018 11:39 PM

## 2018-09-21 LAB — BASIC METABOLIC PANEL
Anion gap: 4 — ABNORMAL LOW (ref 5–15)
BUN: 17 mg/dL (ref 8–23)
CO2: 27 mmol/L (ref 22–32)
Calcium: 9.2 mg/dL (ref 8.9–10.3)
Chloride: 104 mmol/L (ref 98–111)
Creatinine, Ser: 1.36 mg/dL — ABNORMAL HIGH (ref 0.61–1.24)
GFR calc Af Amer: 55 mL/min — ABNORMAL LOW (ref >60)
GFR calc non Af Amer: 48 mL/min — ABNORMAL LOW (ref >60)
Glucose, Bld: 185 mg/dL — ABNORMAL HIGH (ref 70–99)
Potassium: 4 mmol/L (ref 3.5–5.1)
Sodium: 135 mmol/L (ref 135–145)

## 2018-09-21 LAB — GLUCOSE, CAPILLARY
GLUCOSE-CAPILLARY: 170 mg/dL — AB (ref 70–99)
Glucose-Capillary: 175 mg/dL — ABNORMAL HIGH (ref 70–99)
Glucose-Capillary: 106 mg/dL — ABNORMAL HIGH (ref 70–99)
Glucose-Capillary: 181 mg/dL — ABNORMAL HIGH (ref 70–99)

## 2018-09-21 LAB — TSH: TSH: 1.507 u[IU]/mL (ref 0.350–4.500)

## 2018-09-21 LAB — MAGNESIUM
Magnesium: 1.7 mg/dL (ref 1.7–2.4)
Magnesium: 1.7 mg/dL (ref 1.7–2.4)

## 2018-09-21 LAB — DIGOXIN LEVEL: Digoxin Level: <0.2 ng/mL — ABNORMAL LOW (ref 0.8–2.0)

## 2018-09-21 LAB — HEPATIC FUNCTION PANEL
ALT: 20 U/L (ref 0–44)
AST: 18 U/L (ref 15–41)
Albumin: 2.6 g/dL — ABNORMAL LOW (ref 3.5–5.0)
Alkaline Phosphatase: 47 U/L (ref 38–126)
Bilirubin, Direct: 0.1 mg/dL (ref 0.0–0.2)
Indirect Bilirubin: 0.6 mg/dL (ref 0.3–0.9)
Total Bilirubin: 0.7 mg/dL (ref 0.3–1.2)
Total Protein: 5.5 g/dL — ABNORMAL LOW (ref 6.5–8.1)

## 2018-09-21 MED ORDER — LINAGLIPTIN 5 MG PO TABS
5.0000 mg | ORAL_TABLET | Freq: Every day | ORAL | Status: DC
  Administered 2018-09-21 – 2018-09-23 (×3): 5 mg via ORAL
  Filled 2018-09-21 (×3): qty 1

## 2018-09-21 MED ORDER — LACTULOSE 10 GM/15ML PO SOLN
20.0000 g | Freq: Once | ORAL | Status: AC
  Administered 2018-09-21: 20 g via ORAL
  Filled 2018-09-21: qty 30

## 2018-09-21 NOTE — Progress Notes (Addendum)
      [address]       [place] [address]             [phone]        3 Days Post-Op Procedure(s) (LRB): CORONARY ARTERY BYPASS GRAFTING (CABG) X 1 using right greater saphenous vein harvested endoscopically. (N/A) AORTIC VALVE REPLACEMENT (AVR) using inspiris valve - size 25 (N/A) TRANSESOPHAGEAL ECHOCARDIOGRAM (TEE) (N/A)  Subjective: Patient with constipation  Objective: Vital signs in last 24 hours: Temp:  [98.5 F (36.9 C)-98.7 F (37.1 C)] 98.5 F (36.9 C) (10/31 0532) Pulse Rate:  [76-106] 89 (10/31 0532) Cardiac Rhythm: Atrial fibrillation (10/30 2213) Resp:  [16-23] 23 (10/31 0532) BP: (85-137)/(53-67) 135/66 (10/31 0532) SpO2:  [90 %-100 %] 90 % (10/31 0532) Weight:  [81.6 kg] 81.6 kg (10/31 0532)  Pre op weight 81 kg Current Weight  09/21/18 81.6 kg      Intake/Output from previous day: 10/30 0701 - 10/31 0700 In: 277.4 [I.V.:277.4] Out: 1050 [Urine:1050]   Physical Exam:  Cardiovascular: RRR Pulmonary: Clear to auscultation bilaterally Abdomen: Soft, non tender, bowel sounds present. Extremities: Trace bilateral lower extremity edema. Wounds: Sternal and right lower extremity dressings removed and wounds are clean and dry.  No erythema or signs of infection.  Lab Results: CBC: Recent Labs    09/19/18 1645 09/19/18 1650 09/20/18 0448  WBC 14.0*  --  10.1  HGB 9.8* 10.5* 8.7*  HCT 31.6* 31.0* 28.8*  PLT 133*  --  92*   BMET:  Recent Labs    09/20/18 0448 09/21/18 0617  NA 135 135  K 4.3 4.0  CL 104 104  CO2 27 27  GLUCOSE 147* 185*  BUN 12 17  CREATININE 1.14 1.36*  CALCIUM 8.8* 9.2    PT/INR:  Lab Results  Component Value Date   INR 1.30 09/18/2018   INR 1.01 09/14/2018   INR 1.0 08/09/2017   ABG:  INR: Will add last result for INR, ABG once components are confirmed Will add last 4 CBG results once components are confirmed  Assessment/Plan:  1. CV - He went into a fib with RVR last evening.  First degree heart block,SR in the 60-70's this am Amiodarone drip and Lopressor 12.5 mg bid. Will continue with drip until later this am and then stop after oral given. Will titrate BB when BP allows. 2.  Pulmonary - On room air. Encourage incentive spirometer. 3. Volume Overload - On Lasix 40 mg daily 4.  Acute blood loss anemia - H and H yesterday stable at 8.7 and 28.8 5. Thrombocytopenia-platelets 92,000. Not on Lovenox. 6. Creatinine 1.36 this am. On Metformin and Lasix. Will recheck in am. 7. DM-CBGs 194/139/170. On Metformin 1000 mg bid and Insulin. Will restart Sitagliptin. Pre op HGA1C 6.6 8. Remove EPW in am 9. LOC constipation  Donielle M ZimmermanPA-C 09/21/2018,7:09 AM [phone]   Chart reviewed, patient examined, agree with above. Postop atrial fibrillation converted with amiodarone. Will transition to oral today. Creat up slightly today. Will hold lasix today. Wt is only a couple lbs over preop. He feels well overall.

## 2018-09-21 NOTE — Care Management Important Message (Signed)
Important Message  Patient Details  Name: Daniel Mendez MRN: [id number] Date of Birth: 15-Feb-1941   Medicare Important Message Given:  Yes    Barb Merino Makel Mcmann 09/21/2018, 4:11 PM

## 2018-09-21 NOTE — Progress Notes (Signed)
CARDIAC REHAB PHASE I   PRE:  Rate/Rhythm: 70 SR    BP: sitting 127/63    SaO2: 87 2L asleep, up to 95 2L awake  MODE:  Ambulation: 470 ft   POST:  Rate/Rhythm: 86 SR    BP: sitting 134 /59    SaO2: 91 RA  Pt moving well, adhering to precautions. Used RW and RA in hall, slight assist as he got dizzy x1. No c/o otherwise. Able to walk on RA but he does dip down when asleep. Left [place] close to him in room but on RA. Eager to walk again later. Practicing IS. [phone]   [place], ACSM 09/21/2018 9:49 AM

## 2018-09-22 LAB — BASIC METABOLIC PANEL
Anion gap: 4 — ABNORMAL LOW (ref 5–15)
BUN: 19 mg/dL (ref 8–23)
CO2: 27 mmol/L (ref 22–32)
Calcium: 9.2 mg/dL (ref 8.9–10.3)
Chloride: 104 mmol/L (ref 98–111)
Creatinine, Ser: 1.31 mg/dL — ABNORMAL HIGH (ref 0.61–1.24)
GFR calc Af Amer: 58 mL/min — ABNORMAL LOW (ref >60)
GFR calc non Af Amer: 50 mL/min — ABNORMAL LOW (ref >60)
Glucose, Bld: 170 mg/dL — ABNORMAL HIGH (ref 70–99)
Potassium: 4.1 mmol/L (ref 3.5–5.1)
Sodium: 135 mmol/L (ref 135–145)

## 2018-09-22 LAB — GLUCOSE, CAPILLARY
GLUCOSE-CAPILLARY: 168 mg/dL — AB (ref 70–99)
GLUCOSE-CAPILLARY: 121 mg/dL — AB (ref 70–99)
Glucose-Capillary: 160 mg/dL — ABNORMAL HIGH (ref 70–99)
Glucose-Capillary: 128 mg/dL — ABNORMAL HIGH (ref 70–99)

## 2018-09-22 NOTE — Progress Notes (Signed)
Pt assisted to ambulate 2 full laps of unit, using RW.  Slow steady gait, no complaints, no tele alarms.  To bed after walk for pulling of EPWs.  WIll cont plan of care.

## 2018-09-22 NOTE — Progress Notes (Signed)
EPWs pulled per order.  Pt tolerated very well, all tips intact, sites unremarkable.  Pt and wife understand bedrest for one hr with frequent VS checks.  VSS, see flowsheet.  CCMD notified, will monitor closely.

## 2018-09-22 NOTE — Progress Notes (Signed)
CARDIAC REHAB PHASE I   PRE:  Rate/Rhythm: 81 SR    BP: sitting 133/61    SaO2: 96 RA  MODE:  Ambulation: 890 ft   POST:  Rate/Rhythm: 102 ST    BP: sitting 141/55     SaO2: 97 RA  Pt moving well. Ambulated long distance with RW (has one at home). No c/o, SaO2 good on RA. He apparently needed O2 last night while sleeping. This will need to be documented well tonight if O2 is needed. Ed completed with pt and wife. Good reception. Will refer to [place]. Set up d/c video. Encouraged IS and more walking today. [phone]   [place], ACSM 09/22/2018 10:14 AM

## 2018-09-22 NOTE — Progress Notes (Signed)
      [address]       [place] [address]             [phone]        4 Days Post-Op Procedure(s) (LRB): CORONARY ARTERY BYPASS GRAFTING (CABG) X 1 using right greater saphenous vein harvested endoscopically. (N/A) AORTIC VALVE REPLACEMENT (AVR) using inspiris valve - size 25 (N/A) TRANSESOPHAGEAL ECHOCARDIOGRAM (TEE) (N/A)  Subjective: Patient had multiple bowel movements. He slept well. Wife at bedside and questions answered.  Objective: Vital signs in last 24 hours: Temp:  [98.3 F (36.8 C)-98.7 F (37.1 C)] 98.5 F (36.9 C) (11/01 0340) Pulse Rate:  [67-78] 74 (11/01 0340) Cardiac Rhythm: Normal sinus rhythm (11/01 0340) Resp:  [17-20] 20 (11/01 0340) BP: (119-132)/(50-61) 119/61 (11/01 0340) SpO2:  [90 %-96 %] 93 % (11/01 0340) Weight:  [81.4 kg] 81.4 kg (11/01 0441)  Pre op weight 81 kg Current Weight  09/22/18 81.4 kg      Intake/Output from previous day: 10/31 0701 - 11/01 0700 In: 600 [P.O.:600] Out: -    Physical Exam:  Cardiovascular: RRR Pulmonary: Clear to auscultation bilaterally Abdomen: Soft, non tender, bowel sounds present. Extremities: Trace bilateral lower extremity edema. Wounds: Sternal and right lower extremity  wounds are clean and dry.  No erythema or signs of infection.  Lab Results: CBC: Recent Labs    09/19/18 1645 09/19/18 1650 09/20/18 0448  WBC 14.0*  --  10.1  HGB 9.8* 10.5* 8.7*  HCT 31.6* 31.0* 28.8*  PLT 133*  --  92*   BMET:  Recent Labs    09/21/18 0617 09/22/18 0255  NA 135 135  K 4.0 4.1  CL 104 104  CO2 27 27  GLUCOSE 185* 170*  BUN 17 19  CREATININE 1.36* 1.31*  CALCIUM 9.2 9.2    PT/INR:  Lab Results  Component Value Date   INR 1.30 09/18/2018   INR 1.01 09/14/2018   INR 1.0 08/09/2017   ABG:  INR: Will add last result for INR, ABG once components are confirmed Will add last 4 CBG results once components are confirmed  Assessment/Plan:  1. CV - He went into a fib  with RVR last evening. First degree heart block,SR in the 60-70's this am Amiodarone 200 mg bid and Lopressor 12.5 mg bid.  2.  Pulmonary - On room air. Encourage incentive spirometer. 3. Volume Overload - On Lasix 40 mg daily 4.  Acute blood loss anemia - H and H yesterday stable at 8.7 and 28.8 5. Thrombocytopenia-platelets 92,000. Not on Lovenox. 6. Creatinine slightly decreased to 1.31 this am. On Metformin and Lasix. Will recheck in am. 7. DM-CBGs 106/181/168. On Metformin 1000 mg bid, Sitagliptin, and Insulin Pre op HGA1C 6.6 8. Remove EPW 9. Possible discharge in am if remains in [place] 09/22/2018,7:12 AM [phone]

## 2018-09-23 LAB — BASIC METABOLIC PANEL
Anion gap: 5 (ref 5–15)
BUN: 15 mg/dL (ref 8–23)
CO2: 25 mmol/L (ref 22–32)
Calcium: 9.6 mg/dL (ref 8.9–10.3)
Chloride: 107 mmol/L (ref 98–111)
Creatinine, Ser: 1.21 mg/dL (ref 0.61–1.24)
GFR calc Af Amer: >60 mL/min (ref >60)
GFR calc non Af Amer: 55 mL/min — ABNORMAL LOW (ref >60)
Glucose, Bld: 164 mg/dL — ABNORMAL HIGH (ref 70–99)
Potassium: 3.9 mmol/L (ref 3.5–5.1)
Sodium: 137 mmol/L (ref 135–145)

## 2018-09-23 LAB — GLUCOSE, CAPILLARY
Glucose-Capillary: 172 mg/dL — ABNORMAL HIGH (ref 70–99)
Glucose-Capillary: 166 mg/dL — ABNORMAL HIGH (ref 70–99)

## 2018-09-23 MED ORDER — AMIODARONE HCL 200 MG PO TABS: 200.0000 mg | ORAL_TABLET | Freq: Two times a day (BID) | ORAL | 1 refills | Status: DC

## 2018-09-23 MED ORDER — METOPROLOL SUCCINATE ER 25 MG PO TB24: 25.0000 mg | ORAL_TABLET | Freq: Every day | ORAL | 1 refills | Status: DC

## 2018-09-23 MED ORDER — ASPIRIN 325 MG PO TBEC: 325.0000 mg | DELAYED_RELEASE_TABLET | Freq: Every day | ORAL | 0 refills | Status: DC

## 2018-09-23 MED ORDER — TRAMADOL HCL 50 MG PO TABS: ORAL_TABLET | ORAL | 0 refills | Status: DC

## 2018-09-23 MED ORDER — ACETAMINOPHEN 325 MG PO TABS: 650.0000 mg | ORAL_TABLET | Freq: Four times a day (QID) | ORAL | Status: DC | PRN

## 2018-09-23 MED ORDER — POTASSIUM CHLORIDE CRYS ER 20 MEQ PO TBCR
20.0000 meq | EXTENDED_RELEASE_TABLET | Freq: Once | ORAL | Status: AC
  Administered 2018-09-23: 20 meq via ORAL
  Filled 2018-09-23: qty 1

## 2018-09-23 MED ORDER — IRBESARTAN 150 MG PO TABS
75.0000 mg | ORAL_TABLET | Freq: Every day | ORAL | Status: DC
  Administered 2018-09-23: 75 mg via ORAL
  Filled 2018-09-23: qty 1

## 2018-09-23 MED ORDER — TELMISARTAN 40 MG PO TABS: 40.0000 mg | ORAL_TABLET | Freq: Every day | ORAL | 1 refills | Status: DC

## 2018-09-23 NOTE — Sepsis Progress Note (Signed)
Discharged to home with family office visits in place teaching done  

## 2018-09-23 NOTE — Progress Notes (Addendum)
      [address]       [place] [address]             [phone]        5 Days Post-Op Procedure(s) (LRB): CORONARY ARTERY BYPASS GRAFTING (CABG) X 1 using right greater saphenous vein harvested endoscopically. (N/A) AORTIC VALVE REPLACEMENT (AVR) using inspiris valve - size 25 (N/A) TRANSESOPHAGEAL ECHOCARDIOGRAM (TEE) (N/A)  Subjective: Patient without complaints this am.  Objective: Vital signs in last 24 hours: Temp:  [98.2 F (36.8 C)-98.4 F (36.9 C)] 98.2 F (36.8 C) (11/02 0320) Pulse Rate:  [70-73] 70 (11/02 0320) Cardiac Rhythm: Normal sinus rhythm (11/01 1906) Resp:  [18-22] 21 (11/02 0320) BP: (134-155)/(60-76) 155/69 (11/02 0320) SpO2:  [95 %-99 %] 95 % (11/02 0320) Weight:  [80.1 kg] 80.1 kg (11/02 0323)  Pre op weight 81 kg Current Weight  09/23/18 80.1 kg      Intake/Output from previous day: 11/01 0701 - 11/02 0700 In: 600 [P.O.:600] Out: -    Physical Exam:  Cardiovascular: RRR Pulmonary: Clear to auscultation bilaterally Abdomen: Soft, non tender, bowel sounds present. Extremities: Trace bilateral lower extremity edema. Wounds: Sternal and right lower extremity  wounds are clean and dry.  No erythema or signs of infection.  Lab Results: CBC: No results for input(s): WBC, HGB, HCT, PLT in the last 72 hours. BMET:  Recent Labs    09/22/18 0255 09/23/18 0314  NA 135 137  K 4.1 3.9  CL 104 107  CO2 27 25  GLUCOSE 170* 164*  BUN 19 15  CREATININE 1.31* 1.21  CALCIUM 9.2 9.6    PT/INR:  Lab Results  Component Value Date   INR 1.30 09/18/2018   INR 1.01 09/14/2018   INR 1.0 08/09/2017   ABG:  INR: Will add last result for INR, ABG once components are confirmed Will add last 4 CBG results once components are confirmed  Assessment/Plan:  1. CV - Previous a fib with RVR. First degree heart block,SR in the 70-80's this am. On Amiodarone 200 mg bid and Lopressor 12.5 mg bid. Hypertensive so will restart low  dose Micardis as creatinine now normal. 2.  Pulmonary - On room air. Encourage incentive spirometer. 3.  Acute blood loss anemia - H and H yesterday stable at 4.7 and 28.8 5. Thrombocytopenia-platelets 92,000. Not on Lovenox. 6. Creatinine decreased to 1.21 this am.  7. DM-CBGs 128/121/172. On Metformin 1000 mg bid, Sitagliptin, and Insulin Pre op HGA1C 6.6 8. Supplement potassium 9. As discussed with Dr. Cyndia Bent, will discharge home. Chest tube sutures to remain as will remove in office.  Dyamond Tolosa M ZimmermanPA-C 09/23/2018,7:26 AM [phone]

## 2018-10-03 ENCOUNTER — Other Ambulatory Visit: Payer: Self-pay | Admitting: *Deleted

## 2018-10-03 ENCOUNTER — Ambulatory Visit (INDEPENDENT_AMBULATORY_CARE_PROVIDER_SITE_OTHER): Payer: Self-pay | Admitting: *Deleted

## 2018-10-03 VITALS — BP 118/60 | HR 54 | Resp 16

## 2018-10-03 DIAGNOSIS — Z951 Presence of aortocoronary bypass graft: Secondary | ICD-10-CM

## 2018-10-03 DIAGNOSIS — I2510 Atherosclerotic heart disease of native coronary artery without angina pectoris: Secondary | ICD-10-CM

## 2018-10-03 DIAGNOSIS — Z4802 Encounter for removal of sutures: Secondary | ICD-10-CM

## 2018-10-03 DIAGNOSIS — I350 Nonrheumatic aortic (valve) stenosis: Secondary | ICD-10-CM

## 2018-10-03 DIAGNOSIS — Z953 Presence of xenogenic heart valve: Secondary | ICD-10-CM

## 2018-10-04 ENCOUNTER — Encounter: Payer: Self-pay | Admitting: *Deleted

## 2018-10-04 ENCOUNTER — Telehealth: Payer: Self-pay | Admitting: *Deleted

## 2018-10-04 NOTE — Progress Notes (Signed)
Daniel Mendez returns s/p CABG X 1/AVR on 09/16/18. His sternal incision and two previous chest tube sites are very well healed without drainage or signs of infection. He reports a dizzy spell when getting out of the chair to come to the exam room. This has happened a couple of times in the last couple of days also. 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  On return his Metoprolol was reduced to 25 mg 1/2 tablet daily. This resolved the dizziness.  I recommended he do the same since he was prescribed Toprol 25 mg daily on discharge. The sutures were easily removed. Lungs CTA.  He will return next week with a CXR.

## 2018-10-04 NOTE — Telephone Encounter (Signed)
I called Daniel Mendez today to see how he was doing since he had decreased his Toprol from 25  to 12.5 mg twice daily. He said he felt great with no further dizzy spells. He will return next week as scheduled.

## 2018-10-11 ENCOUNTER — Encounter: Payer: Self-pay | Admitting: Nurse Practitioner

## 2018-10-11 ENCOUNTER — Telehealth: Payer: Self-pay | Admitting: *Deleted

## 2018-10-11 ENCOUNTER — Ambulatory Visit: Payer: Medicare Other | Admitting: Nurse Practitioner

## 2018-10-11 VITALS — BP 148/68 | HR 58 | Ht 70.0 in | Wt 178.1 lb

## 2018-10-11 DIAGNOSIS — Z952 Presence of prosthetic heart valve: Secondary | ICD-10-CM

## 2018-10-11 DIAGNOSIS — E875 Hyperkalemia: Secondary | ICD-10-CM

## 2018-10-11 DIAGNOSIS — I25119 Atherosclerotic heart disease of native coronary artery with unspecified angina pectoris: Secondary | ICD-10-CM

## 2018-10-11 DIAGNOSIS — Z951 Presence of aortocoronary bypass graft: Secondary | ICD-10-CM | POA: Diagnosis not present

## 2018-10-11 MED ORDER — AMIODARONE HCL 200 MG PO TABS: 200.0000 mg | ORAL_TABLET | Freq: Two times a day (BID) | ORAL | 1 refills | Status: DC

## 2018-10-11 MED ORDER — TELMISARTAN 80 MG PO TABS: 80.0000 mg | ORAL_TABLET | Freq: Every day | ORAL | 3 refills | Status: DC

## 2018-10-11 NOTE — Patient Instructions (Addendum)
We will be checking the following labs today - BMET & CBC   Medication Instructions:    Continue with your current medicines. BUT  I am increasing the Micardis to 80 mg a day  I would like you to stop your amiodarone as of November 05, 2018   If you need a refill on your cardiac medications before your next appointment, please call your pharmacy.     Testing/Procedures To Be Arranged:  Echocardiogram to follow up AVR  Follow-Up:   See Dr. [name] in January as planned. Needs EKG with that visit.     At [facility], you and your health needs are our priority.  As part of our continuing mission to provide you with exceptional heart care, we have created designated Provider Care Teams.  These Care Teams include your primary Cardiologist (physician) and Advanced Practice Providers (APPs -  Physician Assistants and Nurse Practitioners) who all work together to provide you with the care you need, when you need it.  Special Instructions:  . Will send a message to cardiac rehab.   Call the [place] office at [phone] if you have any questions, problems or concerns.

## 2018-10-11 NOTE — Progress Notes (Signed)
CARDIOLOGY OFFICE NOTE  Date:  10/11/2018    [name] Date of Birth: 03-Nov-1941 Medical Record #[id number]  PCP:  Seward Carol, MD  Cardiologist:  [name]    Chief Complaint  Patient presents with  . Coronary Artery Disease  . Cardiac Valve Problem    Post hospital visit - seen for Dr. [name]    History of Present Illness: GIOVONNIE TRETTEL is a 77 y.o. male who presents today for a post hospital visit. Seen for Dr. [name].   He has a history of HTN, HLD, type 2 diabetes, cerebrovascular disease status post left carotid endarterectomy, CAD with known ostial/proximal occlusion of the right coronary artery by catheterization in 06/2017, and severe aortic stenosis.   Seen back in October by Dr. [name]. He had been having increasing exertional dyspnea and chest pain. Echo was updated - showed worsening AS - left and right heart cath was performed - subsequently referred for surgical intervention with Dr. Cyndia Bent.   Pre operative carotid duplex US showed no significant internal carotid artery stenosis bilaterally. He underwent a CABG x 1 and AVR on 09/18/2018 per Dr. Cyndia Bent. Post op course was typicall. He had some volume overload and expected anemia along with thrombocytopenia. He did have AF with RVR and required amiodarone.   Comes in today. Here with his wife. Has been home 2 weeks. He has had his dose of beta blocker reduced due to dizziness from the surgeon's office. He has had issues with beta blockers in the past - typically cause pre-syncope. He has been able to be on low dose - he is back to his prior dose. He has several questions - wanting to drive, have sex, use a leaf blower, etc. No fever or chills. Some occasional shortness of breath. Weight is back to pre op weight. No chest pain. Back sleeping in the bed. Wants to do rehab at [facility]. Wears dentures - does not go to the dentist. BP running high at home.   Past Medical History:  Diagnosis Date  . Anginal pain  ([place])   . Arthritis    hand- right  . Coronary artery disease   . Diabetes ([place])    type 2  . GERD (gastroesophageal reflux disease)   . Heart murmur    discovered in 2015 - followed by Dr. Daneen Schick  . History of hiatal hernia   . History of kidney stones   . HTN (hypertension)   . Hypercalcemia   . Hyperglyceridemia   . Hyperlipidemia   . Mild aortic stenosis Oct 2014  . Peripheral vascular disease ([place])   . Pneumonia 07/28/2018  . Shortness of breath dyspnea    on exertion  . Sleep apnea    no cpap    Past Surgical History:  Procedure Laterality Date  . AORTIC VALVE REPLACEMENT N/A 09/18/2018   Procedure: AORTIC VALVE REPLACEMENT (AVR) using inspiris valve - size 25;  Surgeon: Gaye Pollack, MD;  Location: [facility] OR;  Service: Open Heart Surgery;  Laterality: N/A;  . CATARACT EXTRACTION Bilateral   . COLONOSCOPY WITH PROPOFOL N/A 11/19/2014   Procedure: COLONOSCOPY WITH PROPOFOL;  Surgeon: Garlan Fair, MD;  Location: [facility] ENDOSCOPY;  Service: Endoscopy;  Laterality: N/A;  . CORONARY ARTERY BYPASS GRAFT N/A 09/18/2018   Procedure: CORONARY ARTERY BYPASS GRAFTING (CABG) X 1 using right greater saphenous vein harvested endoscopically.;  Surgeon: Gaye Pollack, MD;  Location: [facility] OR;  Service: Open Heart Surgery;  Laterality: N/A;  .  dental implants     dental post implanted to hold dentures/partials in place  . ENDARTERECTOMY Left 03/10/2015   Procedure: Left Carotid ENDARTERECTOMY ;  Surgeon: Rosetta Posner, MD;  Location: [place];  Service: Vascular;  Laterality: Left;  . FRACTURE SURGERY Left 1958   Left hand   . HERNIA REPAIR     Bilateral inguinal   . hernia repair b/l inguinal Bilateral   . knee arthroscopy left Left   . RIGHT/LEFT HEART CATH AND CORONARY ANGIOGRAPHY N/A 08/12/2017   Procedure: RIGHT/LEFT HEART CATH AND CORONARY ANGIOGRAPHY;  Surgeon: Belva Crome, MD;  Location: [place] CV LAB;  Service: Cardiovascular;  Laterality: N/A;  . RIGHT/LEFT HEART  CATH AND CORONARY ANGIOGRAPHY N/A 09/01/2018   Procedure: RIGHT/LEFT HEART CATH AND CORONARY ANGIOGRAPHY;  Surgeon: Belva Crome, MD;  Location: [place] [facility] LAB;  Service: Cardiovascular;  Laterality: N/A;  . ROTATOR CUFF REPAIR    . TEE WITHOUT CARDIOVERSION N/A 09/18/2018   Procedure: TRANSESOPHAGEAL ECHOCARDIOGRAM (TEE);  Surgeon: Gaye Pollack, MD;  Location: [place];  Service: Open Heart Surgery;  Laterality: N/A;  . VASECTOMY       Medications: Current Meds  Medication Sig  . acetaminophen (TYLENOL) 325 MG tablet Take 2 tablets (650 mg total) by mouth every 6 (six) hours as needed for mild pain.  [name] Kitchen amiodarone (PACERONE) 200 MG tablet Take 1 tablet (200 mg total) by mouth every 12 (twelve) hours. Stay on 200 mg a day - stop on December 15th, 2019  . aspirin EC 325 MG EC tablet Take 1 tablet (325 mg total) by mouth daily.  [name] Kitchen atorvastatin (LIPITOR) 20 MG tablet Take 20 mg by mouth daily at 12 noon.   . cyclobenzaprine (FLEXERIL) 5 MG tablet Take 2.5 mg by mouth daily as needed for muscle spasms.   [name] Kitchen glimepiride (AMARYL) 4 MG tablet Take 4 mg by mouth daily with breakfast.  . metFORMIN (GLUCOPHAGE) 500 MG tablet Take 1,000 mg by mouth 2 (two) times daily.   . metoprolol succinate (TOPROL-XL) 25 MG 24 hr tablet Take 12.5 mg by mouth daily.  . Omega-3 Fatty Acids (FISH OIL) 1000 MG CAPS Take 1,000 mg by mouth 2 (two) times daily.   [name] Kitchen omeprazole (PRILOSEC) 20 MG capsule Take 20 mg by mouth every other day. At noon  . ONE TOUCH ULTRA TEST test strip 1 each by Other route daily.   Glory Rosebush DELICA LANCETS 92E MISC 1 each by Other route daily.   . sitaGLIPtin (JANUVIA) 100 MG tablet Take 100 mg by mouth daily with lunch.   . tamsulosin (FLOMAX) 0.4 MG CAPS capsule Take 0.4 mg by mouth every evening.  . testosterone cypionate (DEPOTESTOSTERONE CYPIONATE) 200 MG/ML injection Inject 200 mg into the muscle every 14 (fourteen) days.  . traMADol (ULTRAM) 50 MG tablet Take 50 mg by mouth every  4-6 hours PRN severe pain  . [DISCONTINUED] amiodarone (PACERONE) 200 MG tablet Take 1 tablet (200 mg total) by mouth every 12 (twelve) hours. For 5 days;then take 200 mg daily thereafter  . [DISCONTINUED] metoprolol succinate (TOPROL-XL) 25 MG 24 hr tablet Take 1 tablet (25 mg total) by mouth daily.  . [DISCONTINUED] telmisartan (MICARDIS) 40 MG tablet Take 1 tablet (40 mg total) by mouth daily with lunch.     Allergies: No Known Allergies  Social History: The patient  reports that he has never smoked. He has never used smokeless tobacco. He reports that he drinks alcohol. He reports that  he does not use drugs.   Family History: The patient's family history includes CVA in his mother; Cancer in his daughter; Diabetes in his brother; Heart disease in his brother and father; Peripheral vascular disease in his mother.   Review of Systems: Please see the history of present illness.   Otherwise, the review of systems is positive for none.   All other systems are reviewed and negative.   Physical Exam: VS:  BP (!) 148/68 (BP Location: Left Arm, Patient Position: Sitting, Cuff Size: Normal)   Pulse (!) 58   Ht 5\' 10"  (1.778 m)   Wt 178 lb 1.9 oz (80.8 kg)   BMI 25.56 kg/m  .  BMI Body mass index is 25.56 kg/m.  Wt Readings from Last 3 Encounters:  10/11/18 178 lb 1.9 oz (80.8 kg)  09/23/18 176 lb 9.6 oz (80.1 kg)  09/14/18 178 lb 9.6 oz (81 kg)    General: Pleasant. He looks younger than his stated age. He is alert and in no acute distress.   HEENT: Normal.  Neck: Supple, no JVD, carotid bruits, or masses noted.  Cardiac: Regular rate and rhythm. No murmur that I appreciate. Sternum looks good. No edema.  Respiratory:  Lungs are clear to auscultation bilaterally with normal work of breathing.  GI: Soft and nontender.  MS: No deformity or atrophy. Gait and ROM intact.  Skin: Warm and dry. Color is normal.  Neuro:  Strength and sensation are intact and no gross focal deficits noted.   Psych: Alert, appropriate and with normal affect.   LABORATORY DATA:  EKG:  EKG is ordered today. This demonstrates sinus brady - HR is 58 - nonspecific changes.  Lab Results  Component Value Date   WBC 10.1 09/20/2018   HGB 8.7 (L) 09/20/2018   HCT 28.8 (L) 09/20/2018   PLT 92 (L) 09/20/2018   GLUCOSE 164 (H) 09/23/2018   ALT 20 09/20/2018   AST 18 09/20/2018   NA 137 09/23/2018   K 3.9 09/23/2018   CL 107 09/23/2018   CREATININE 1.21 09/23/2018   BUN 15 09/23/2018   CO2 25 09/23/2018   TSH 1.507 09/20/2018   INR 1.30 09/18/2018   HGBA1C 6.6 (H) 09/14/2018       BNP (last 3 results) No results for input(s): BNP in the last 8760 hours.  ProBNP (last 3 results) Recent Labs    08/29/18 1239  PROBNP 156     Other Studies Reviewed Today:  Procedure 09/18/2018 Coronary artery bypass grafting x 1 with SVG to RCA & Aortic valve replacement using a 25 mm Edwards INSPIRIS RESILIA pericardial valve.   RIGHT/LEFT HEART CATH AND CORONARY ANGIOGRAPHY 08/2018  Conclusion    Calcific aortic stenosis, severe by echo criteria and mild to moderate by hemodynamic assessment.  By hemodynamic assessment, using a cardiac output of 5.4 L/min and gradient of 21 mmHg the valve area is 1.72 cm  Previously documented normal LV systolic function with EF greater than 60%.  Echo demonstrates mild to moderate concentric hypertrophy.  Coronary artery disease with ostial occlusion of the right coronary.  The right coronary is codominant.  The right coronary fills by left-to-right collaterals.  Widely patent left main  Diffuse atherosclerosis throughout the proximal to distal vessel with up to 40 to 50% mid vessel narrowing.  No focal high-grade obstruction is seen.  Circumflex contains eccentric 40% proximal stenosis.  RECOMMENDATIONS:   Despite discordance between hemodynamic and echo data, the valve has evidence of significant stenosis  by echo, a peak velocity greater than 4  m/s by echo, angiography demonstrates a chronically occluded small to moderate size RCA, and the patient is severely symptomatic from both angina and dyspnea.  Therefore, I will referred the patient for consideration of surgical aortic valve replacement and coronary bypass surgery.  I am somewhat concerned that there may be some other issue involved producing his symptoms.  I have personally reviewed the echocardiogram and it does not particularly appear to exhibit amyloid.  I cannot blame his symptoms on anything other than the combination of CAD and moderately severe aortic stenosis. Recommend Aspirin 81mg  daily for moderate CAD.   Doppler echocardiogram 08/14/2018: ------------------------------------------------------------------- Study Conclusions  - Left ventricle: The cavity size was normal. Wall thickness was increased in a pattern of mild LVH. Systolic function was normal. The estimated ejection fraction was in the range of 60% to 65%. Left ventricular diastolic function parameters were normal. - Aortic valve: Progressive AS since last study mean gradient has increased from 26 mmHg to 39 mmHg. Valve area 1.1 but indexes to .52 cm2. There was moderate to severe stenosis. - Left atrium: The atrium was moderately dilated. - Atrial septum: No defect or patent foramen ovale was identified.    Assessment/Plan:  1. Post op AVR/CABG x 1 - doing well. Needs labs today. Referral to cardiac rehab per Dr. [name] sent. Reviewed lifting restrictions. Would hold on sex, blowing leaves for now.  Arrange for follow up echo. Discussed SBE.   2. PAF - in sinus - back to low dose Toprol - symptoms of presyncope have improved. Will stop Amiodarone as of December 15th. Recheck EKG on return.   3. HTN - BP too high - running high at home - will go back to Micardis 80 mg a day - he was on this dose prior. He will continue to monitor.   4. HLD - on statin - labs from his KPN noted.   5. DM -  followed by PCP  6. CKD - recheck lab today.   7. Post op anemia - rechecking today.    Current medicines are reviewed with the patient today.  The patient does not have concerns regarding medicines other than what has been noted above.  The following changes have been made:  See above.  Labs/ tests ordered today include:    Orders Placed This Encounter  Procedures  . Basic metabolic panel  . CBC  . AMB referral to cardiac rehabilitation  . EKG 12-Lead  . ECHOCARDIOGRAM COMPLETE     Disposition:   FU with Dr. [name] in January as planned.  Will recheck EKG on return. I am happy to see back if needed.   Patient is agreeable to this plan and will call if any problems develop in the interim.   SignedTruitt Merle, NP  10/11/2018 10:29 AM  [facility] [address] [place], [place]  [address] Phone: [phone] Fax: [phone]

## 2018-10-11 NOTE — Telephone Encounter (Signed)
Result Notes for Basic metabolic panel   Notes recorded by Nuala Alpha, LPN on 45/99/7741 at 5:09 PM EST [facility] called in to report this pts abnormal K level of 5.8.  Went to the DOD Daniel Mendez, for Daniel Merle NP was already out of the office.  Per Daniel Mendez, he advised that the pt to HOLD Telmisartan, hydrate super well tonight and tomorrow, hold off on any foods containing potassium, and repeat his BMET tomorrow to verify if this lab was hemolyzed or not. Per Daniel Mendez, he advised to make Daniel Mendez aware of this, as well as the pts Primary Cardiologist Daniel Mendez, so further follow-up and recommendations can be made.  Spoke with the pt and endorsed to her DOD Daniel Jackalyn Lombard recommendations for him to HOLD off on taking Telmisartan, hydrate well with water tonight and tomorrow, avoid foods high in potassium tonight and tomorrow, and come in for a repeat BMET at our office tomorrow.   Scheduled the pts lab appt for tomorrow 11/21 to repeat his BMET. Reiterated to the pt the importance for him to really hydrate with water and avoid foods containing potassium, within the next 24 hours.  Informed the pt that I will still route these recommendations to both Daniel Merle NP and Daniel Mendez, for further follow-up and recommendations as needed.  Pt verbalized understanding and agrees with this plan.

## 2018-10-12 ENCOUNTER — Other Ambulatory Visit: Payer: Medicare Other | Admitting: *Deleted

## 2018-10-12 DIAGNOSIS — E875 Hyperkalemia: Secondary | ICD-10-CM

## 2018-10-12 LAB — BASIC METABOLIC PANEL
BUN / CREAT RATIO: 13 (ref 10–24)
BUN/Creatinine Ratio: 14 (ref 10–24)
BUN: 20 mg/dL (ref 8–27)
BUN: 20 mg/dL (ref 8–27)
CHLORIDE: 104 mmol/L (ref 96–106)
CO2: 21 mmol/L (ref 20–29)
CO2: 24 mmol/L (ref 20–29)
Calcium: 11.1 mg/dL — ABNORMAL HIGH (ref 8.6–10.2)
Calcium: 9.9 mg/dL (ref 8.6–10.2)
Chloride: 100 mmol/L (ref 96–106)
Creatinine, Ser: 1.48 mg/dL — ABNORMAL HIGH (ref 0.76–1.27)
Creatinine, Ser: 1.5 mg/dL — ABNORMAL HIGH (ref 0.76–1.27)
GFR calc Af Amer: 51 mL/min/{1.73_m2} — ABNORMAL LOW (ref >59)
GFR calc non Af Amer: 44 mL/min/{1.73_m2} — ABNORMAL LOW (ref >59)
GFR calc non Af Amer: 44 mL/min/{1.73_m2} — ABNORMAL LOW (ref >59)
GFR, EST AFRICAN AMERICAN: 50 mL/min/{1.73_m2} — AB (ref >59)
Glucose: 143 mg/dL — ABNORMAL HIGH (ref 65–99)
Glucose: 127 mg/dL — ABNORMAL HIGH (ref 65–99)
Potassium: 5.8 mmol/L (ref 3.5–5.2)
Potassium: 5.3 mmol/L — ABNORMAL HIGH (ref 3.5–5.2)
Sodium: 138 mmol/L (ref 134–144)
Sodium: 135 mmol/L (ref 134–144)

## 2018-10-12 LAB — CBC
Hematocrit: 32.6 % — ABNORMAL LOW (ref 37.5–51.0)
Hemoglobin: 10.5 g/dL — ABNORMAL LOW (ref 13.0–17.7)
MCH: 24.6 pg — ABNORMAL LOW (ref 26.6–33.0)
MCHC: 32.2 g/dL (ref 31.5–35.7)
MCV: 76 fL — ABNORMAL LOW (ref 79–97)
Platelets: 333 10*3/uL (ref 150–450)
RBC: 4.27 x10E6/uL (ref 4.14–5.80)
RDW: 15.2 % (ref 12.3–15.4)
WBC: 10.1 10*3/uL (ref 3.4–10.8)

## 2018-10-12 NOTE — Telephone Encounter (Signed)
Agree with holding the Micardis, but will need BP control - start Norvasc 5 mg a day.  Monitor BP and call office with update in about a week please.   Burtis Junes, RN, [place] [address] [place], [place]  [address] [phone]

## 2018-10-12 NOTE — Telephone Encounter (Signed)
Pt K came back 5.3.[name] KitchenMarland Kitchenper orders Dr. Meda Coffee to continue to hold Telmisartan and to recheck in 2 weeks. Pt verbalized understanding and will call if he develops any questions or problems.. Pt is f/u with Dr. [name] 1/20.

## 2018-10-12 NOTE — Addendum Note (Signed)
Addended by: Stephani Police on: 10/12/2018 05:40 PM   Modules accepted: Orders

## 2018-10-13 ENCOUNTER — Ambulatory Visit (HOSPITAL_COMMUNITY): Payer: Medicare Other | Attending: Cardiology

## 2018-10-13 ENCOUNTER — Other Ambulatory Visit: Payer: Self-pay

## 2018-10-13 ENCOUNTER — Other Ambulatory Visit: Payer: Self-pay | Admitting: Physician Assistant

## 2018-10-13 DIAGNOSIS — Z952 Presence of prosthetic heart valve: Secondary | ICD-10-CM | POA: Insufficient documentation

## 2018-10-13 DIAGNOSIS — Z951 Presence of aortocoronary bypass graft: Secondary | ICD-10-CM | POA: Insufficient documentation

## 2018-10-13 DIAGNOSIS — I25119 Atherosclerotic heart disease of native coronary artery with unspecified angina pectoris: Secondary | ICD-10-CM | POA: Diagnosis not present

## 2018-10-13 MED ORDER — AMLODIPINE BESYLATE 5 MG PO TABS: 5.0000 mg | ORAL_TABLET | Freq: Every day | ORAL | 3 refills | Status: DC

## 2018-10-13 NOTE — Addendum Note (Signed)
Addended by: Stephani Police on: 10/13/2018 09:45 AM   Modules accepted: Orders

## 2018-10-13 NOTE — Telephone Encounter (Signed)
Pt given Cecille Rubin Gerhardt's recommendations and will keep track of his BP readings and let us know how he is doing...   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 Will requiring standing for a period of time and will not be lifting over 5 pounds.   Advised him that I will forward to [facility] and will let him know when he can pick up the letter if she approves of his request. Pt verbalized understanding.

## 2018-10-16 ENCOUNTER — Encounter: Payer: Self-pay | Admitting: Nurse Practitioner

## 2018-10-16 ENCOUNTER — Other Ambulatory Visit: Payer: Self-pay | Admitting: *Deleted

## 2018-10-16 NOTE — Telephone Encounter (Signed)
This has been completed.

## 2018-10-16 NOTE — Telephone Encounter (Signed)
S/w pt to advise letter was left up front for church.  Also clarified medications.

## 2018-10-17 ENCOUNTER — Other Ambulatory Visit: Payer: Medicare Other | Admitting: *Deleted

## 2018-10-17 ENCOUNTER — Telehealth: Payer: Self-pay | Admitting: *Deleted

## 2018-10-17 DIAGNOSIS — E875 Hyperkalemia: Secondary | ICD-10-CM

## 2018-10-17 NOTE — Telephone Encounter (Signed)
-----   Message from Belva Crome, MD sent at 10/16/2018  8:36 PM EST ----- Let the patient know I would like him to have repeat BMET Tuesday or Wednesday A copy will be sent to Seward Carol, MD

## 2018-10-17 NOTE — Telephone Encounter (Signed)
Spoke with pt and he was agreeable to come today for labs.

## 2018-10-18 LAB — BASIC METABOLIC PANEL
BUN / CREAT RATIO: 13 (ref 10–24)
BUN: 19 mg/dL (ref 8–27)
CHLORIDE: 102 mmol/L (ref 96–106)
CO2: 20 mmol/L (ref 20–29)
Calcium: 10.6 mg/dL — ABNORMAL HIGH (ref 8.6–10.2)
Creatinine, Ser: 1.42 mg/dL — ABNORMAL HIGH (ref 0.76–1.27)
GFR calc Af Amer: 54 mL/min/{1.73_m2} — ABNORMAL LOW (ref >59)
GFR calc non Af Amer: 47 mL/min/{1.73_m2} — ABNORMAL LOW (ref >59)
GLUCOSE: 196 mg/dL — AB (ref 65–99)
POTASSIUM: 5.2 mmol/L (ref 3.5–5.2)
Sodium: 139 mmol/L (ref 134–144)

## 2018-10-19 ENCOUNTER — Other Ambulatory Visit: Payer: Self-pay | Admitting: Physician Assistant

## 2018-10-24 ENCOUNTER — Ambulatory Visit: Payer: Medicare Other | Admitting: Interventional Cardiology

## 2018-10-24 ENCOUNTER — Telehealth: Payer: Self-pay | Admitting: Interventional Cardiology

## 2018-10-24 DIAGNOSIS — I350 Nonrheumatic aortic (valve) stenosis: Secondary | ICD-10-CM

## 2018-10-24 DIAGNOSIS — I10 Essential (primary) hypertension: Secondary | ICD-10-CM

## 2018-10-24 MED ORDER — HYDROCHLOROTHIAZIDE 12.5 MG PO CAPS: 12.5000 mg | ORAL_CAPSULE | Freq: Every day | ORAL | 3 refills | Status: DC

## 2018-10-24 NOTE — Telephone Encounter (Signed)
Notes recorded by Belva Crome, MD on 10/19/2018 at 12:41 PM EST Let the patient know potassium is upper normal and needs to start HCTZ 12.5 mg daily. BMET in 1 month.  Spoke with pt and went over results and recommendations per Dr. [name].  Pt in agreement with plan.  He will come 11/23/2018 for labs.  Pt appreciative for call.

## 2018-10-24 NOTE — Telephone Encounter (Signed)
Patient returned call for lab results. Please call after 10:30am.

## 2018-10-24 NOTE — Addendum Note (Signed)
Addended by: Loren Racer on: 10/24/2018 11:09 AM   Modules accepted: Orders

## 2018-10-26 ENCOUNTER — Other Ambulatory Visit: Payer: Medicare Other

## 2018-10-26 ENCOUNTER — Encounter: Payer: Medicare Other | Attending: Interventional Cardiology | Admitting: *Deleted

## 2018-10-26 ENCOUNTER — Encounter: Payer: Self-pay | Admitting: *Deleted

## 2018-10-26 VITALS — Ht 70.5 in | Wt 178.6 lb

## 2018-10-26 DIAGNOSIS — Z7984 Long term (current) use of oral hypoglycemic drugs: Secondary | ICD-10-CM | POA: Diagnosis not present

## 2018-10-26 DIAGNOSIS — K219 Gastro-esophageal reflux disease without esophagitis: Secondary | ICD-10-CM | POA: Diagnosis not present

## 2018-10-26 DIAGNOSIS — E781 Pure hyperglyceridemia: Secondary | ICD-10-CM | POA: Insufficient documentation

## 2018-10-26 DIAGNOSIS — G4730 Sleep apnea, unspecified: Secondary | ICD-10-CM | POA: Diagnosis not present

## 2018-10-26 DIAGNOSIS — Z951 Presence of aortocoronary bypass graft: Secondary | ICD-10-CM | POA: Diagnosis present

## 2018-10-26 DIAGNOSIS — Z952 Presence of prosthetic heart valve: Secondary | ICD-10-CM

## 2018-10-26 DIAGNOSIS — I10 Essential (primary) hypertension: Secondary | ICD-10-CM | POA: Insufficient documentation

## 2018-10-26 DIAGNOSIS — Z7982 Long term (current) use of aspirin: Secondary | ICD-10-CM | POA: Diagnosis not present

## 2018-10-26 DIAGNOSIS — I739 Peripheral vascular disease, unspecified: Secondary | ICD-10-CM | POA: Insufficient documentation

## 2018-10-26 DIAGNOSIS — I2510 Atherosclerotic heart disease of native coronary artery without angina pectoris: Secondary | ICD-10-CM | POA: Insufficient documentation

## 2018-10-26 DIAGNOSIS — E785 Hyperlipidemia, unspecified: Secondary | ICD-10-CM | POA: Insufficient documentation

## 2018-10-26 DIAGNOSIS — E119 Type 2 diabetes mellitus without complications: Secondary | ICD-10-CM | POA: Diagnosis not present

## 2018-10-26 DIAGNOSIS — Z79899 Other long term (current) drug therapy: Secondary | ICD-10-CM | POA: Insufficient documentation

## 2018-10-26 NOTE — Progress Notes (Signed)
Cardiac Individual Treatment Plan  Patient Details  Name: Daniel Mendez MRN: [id number] Date of Birth: 1941/04/20 Referring Provider:     Cardiac Rehab from 10/26/2018 in [facility] Cardiac and Pulmonary Rehab  Referring Provider  [name]      Initial Encounter Date:    Cardiac Rehab from 10/26/2018 in [facility] Cardiac and Pulmonary Rehab  Date  10/26/18      Visit Diagnosis: S/P CABG x 1  S/P TAVR (transcatheter aortic valve replacement)  Patient's Home Medications on Admission:  Current Outpatient Medications:  .  acetaminophen (TYLENOL) 325 MG tablet, Take 2 tablets (650 mg total) by mouth every 6 (six) hours as needed for mild pain., Disp: , Rfl:  .  amiodarone (PACERONE) 200 MG tablet, Take 1 tablet (200 mg total) by mouth every 12 (twelve) hours. Stay on 200 mg a day - stop on December 15th, 2019, Disp: 30 tablet, Rfl: 1 .  amLODipine (NORVASC) 5 MG tablet, Take 1 tablet (5 mg total) by mouth daily., Disp: 90 tablet, Rfl: 3 .  aspirin 325 MG EC tablet, TAKE 1 TABLET BY MOUTH EVERY DAY, Disp: 30 tablet, Rfl: 0 .  atorvastatin (LIPITOR) 20 MG tablet, Take 20 mg by mouth daily at 12 noon. , Disp: , Rfl:  .  cyclobenzaprine (FLEXERIL) 5 MG tablet, Take 2.5 mg by mouth daily as needed for muscle spasms. , Disp: , Rfl:  .  glimepiride (AMARYL) 4 MG tablet, Take 4 mg by mouth daily with breakfast., Disp: , Rfl:  .  hydrochlorothiazide (MICROZIDE) 12.5 MG capsule, Take 1 capsule (12.5 mg total) by mouth daily., Disp: 90 capsule, Rfl: 3 .  metFORMIN (GLUCOPHAGE) 500 MG tablet, Take 1,000 mg by mouth 2 (two) times daily. , Disp: , Rfl:  .  metoprolol succinate (TOPROL-XL) 25 MG 24 hr tablet, Take 12.5 mg by mouth daily., Disp: , Rfl:  .  Omega-3 Fatty Acids (FISH OIL) 1000 MG CAPS, Take 1,000 mg by mouth 2 (two) times daily. , Disp: , Rfl:  .  omeprazole (PRILOSEC) 20 MG capsule, Take 20 mg by mouth every other day. At noon, Disp: , Rfl:  .  ONE TOUCH ULTRA TEST test strip, 1 each by Other route  daily. , Disp: , Rfl:  .  ONETOUCH DELICA LANCETS 40J MISC, 1 each by Other route daily. , Disp: , Rfl:  .  sitaGLIPtin (JANUVIA) 100 MG tablet, Take 100 mg by mouth daily with lunch. , Disp: , Rfl:  .  tamsulosin (FLOMAX) 0.4 MG CAPS capsule, Take 0.4 mg by mouth every evening., Disp: , Rfl:  .  testosterone cypionate (DEPOTESTOSTERONE CYPIONATE) 200 MG/ML injection, Inject 200 mg into the muscle every 14 (fourteen) days., Disp: , Rfl:  .  traMADol (ULTRAM) 50 MG tablet, Take 50 mg by mouth every 4-6 hours PRN severe pain, Disp: 28 tablet, Rfl: 0  Past Medical History: Past Medical History:  Diagnosis Date  . Anginal pain ([place])   . Arthritis    hand- right  . Coronary artery disease   . Diabetes ([place])    type 2  . GERD (gastroesophageal reflux disease)   . Heart murmur    discovered in 2015 - followed by Dr. Daneen Schick  . History of hiatal hernia   . History of kidney stones   . HTN (hypertension)   . Hypercalcemia   . Hyperglyceridemia   . Hyperlipidemia   . Mild aortic stenosis Oct 2014  . Peripheral vascular disease ([place])   . Pneumonia  07/28/2018  . Shortness of breath dyspnea    on exertion  . Sleep apnea    no cpap    Tobacco Use: Social History   Tobacco Use  Smoking Status Never Smoker  Smokeless Tobacco Never Used    Labs: Recent Review Flowsheet Data    Labs for ITP Cardiac and Pulmonary Rehab Latest Ref Rng & Units 09/18/2018 09/18/2018 09/18/2018 09/18/2018 09/19/2018   Hemoglobin A1c 4.8 - 5.6 % - - - - -   PHART 7.350 - 7.450 7.358 7.348(L) 7.323(L) - -   PCO2ART 32.0 - 48.0 mmHg 43.6 40.2 45.2 - -   HCO3 20.0 - 28.0 mmol/L 24.8 22.1 23.4 - -   TCO2 22 - 32 mmol/L '26 23 25 24 26   ' ACIDBASEDEF 0.0 - 2.0 mmol/L 1.0 3.0(H) 3.0(H) - -   O2SAT % 99.0 96.0 94.0 - -       Exercise Target Goals: Exercise Program Goal: Individual exercise prescription set using results from initial 6 min walk test and THRR while considering  patient's activity barriers  and safety.   Exercise Prescription Goal: Initial exercise prescription builds to 30-45 minutes a day of aerobic activity, 2-3 days per week.  Home exercise guidelines will be given to patient during program as part of exercise prescription that the participant will acknowledge.  Activity Barriers & Risk Stratification: Activity Barriers & Cardiac Risk Stratification - 10/26/18 1424      Activity Barriers & Cardiac Risk Stratification   Activity Barriers  Joint Problems;Shortness of Breath    Cardiac Risk Stratification  Moderate       6 Minute Walk: 6 Minute Walk    Row Name 10/26/18 1438         6 Minute Walk   Phase  Initial     Distance  1342 feet     Walk Time  6 minutes     # of Rest Breaks  0     MPH  2.54     METS  3.2     RPE  11     Perceived Dyspnea   0     VO2 Peak  11.17     Symptoms  No     Resting HR  71 bpm     Resting BP  148/60     Resting Oxygen Saturation   97 %     Max Ex. HR  105 bpm     Max Ex. BP  180/66     2 Minute Post BP  144/68        Oxygen Initial Assessment:   Oxygen Re-Evaluation:   Oxygen Discharge (Final Oxygen Re-Evaluation):   Initial Exercise Prescription: Initial Exercise Prescription - 10/26/18 1400      Date of Initial Exercise RX and Referring Provider   Date  10/26/18    Referring Provider  [name]      Treadmill   MPH  2.5    Grade  0.5    Minutes  15    METs  3.09      Recumbant Bike   Level  3    RPM  60    Watts  32    Minutes  15    METs  3      REL-XR   Level  3    Speed  50    Minutes  15    METs  3      Prescription Details   Frequency (times per week)  3    Duration  Progress to 45 minutes of aerobic exercise without signs/symptoms of physical distress      Intensity   THRR 40-80% of Max Heartrate  99-127    Ratings of Perceived Exertion  11-13    Perceived Dyspnea  0-4      Resistance Training   Training Prescription  Yes    Weight  3 lb    Reps  10-15       Perform Capillary  Blood Glucose checks as needed.  Exercise Prescription Changes: Exercise Prescription Changes    Row Name 10/26/18 1400             Response to Exercise   Blood Pressure (Admit)  148/60       Blood Pressure (Exercise)  180/66       Blood Pressure (Exit)  144/68       Heart Rate (Admit)  64 bpm       Heart Rate (Exercise)  105 bpm       Heart Rate (Exit)  68 bpm       Oxygen Saturation (Admit)  97 %       Rating of Perceived Exertion (Exercise)  11          Exercise Comments:   Exercise Goals and Review: Exercise Goals    Row Name 10/26/18 1438             Exercise Goals   Increase Physical Activity  Yes       Intervention  Provide advice, education, support and counseling about physical activity/exercise needs.;Develop an individualized exercise prescription for aerobic and resistive training based on initial evaluation findings, risk stratification, comorbidities and participant's personal goals.       Expected Outcomes  Short Term: Attend rehab on a regular basis to increase amount of physical activity.;Long Term: Exercising regularly at least 3-5 days a week.;Long Term: Add in home exercise to make exercise part of routine and to increase amount of physical activity.       Increase Strength and Stamina  Yes       Intervention  Provide advice, education, support and counseling about physical activity/exercise needs.;Develop an individualized exercise prescription for aerobic and resistive training based on initial evaluation findings, risk stratification, comorbidities and participant's personal goals.       Expected Outcomes  Short Term: Increase workloads from initial exercise prescription for resistance, speed, and METs.;Short Term: Perform resistance training exercises routinely during rehab and add in resistance training at home;Long Term: Improve cardiorespiratory fitness, muscular endurance and strength as measured by increased METs and functional capacity (6MWT)        Able to understand and use rate of perceived exertion (RPE) scale  Yes       Intervention  Provide education and explanation on how to use RPE scale       Expected Outcomes  Short Term: Able to use RPE daily in rehab to express subjective intensity level;Long Term:  Able to use RPE to guide intensity level when exercising independently       Knowledge and understanding of Target Heart Rate Range (THRR)  Yes       Intervention  Provide education and explanation of THRR including how the numbers were predicted and where they are located for reference       Expected Outcomes  Short Term: Able to state/look up THRR;Short Term: Able to use daily as guideline for intensity in rehab;Long Term: Able to use THRR to  govern intensity when exercising independently       Able to check pulse independently  Yes       Intervention  Provide education and demonstration on how to check pulse in carotid and radial arteries.;Review the importance of being able to check your own pulse for safety during independent exercise       Expected Outcomes  Short Term: Able to explain why pulse checking is important during independent exercise;Long Term: Able to check pulse independently and accurately       Understanding of Exercise Prescription  Yes       Intervention  Provide education, explanation, and written materials on patient's individual exercise prescription       Expected Outcomes  Short Term: Able to explain program exercise prescription;Long Term: Able to explain home exercise prescription to exercise independently          Exercise Goals Re-Evaluation :   Discharge Exercise Prescription (Final Exercise Prescription Changes): Exercise Prescription Changes - 10/26/18 1400      Response to Exercise   Blood Pressure (Admit)  148/60    Blood Pressure (Exercise)  180/66    Blood Pressure (Exit)  144/68    Heart Rate (Admit)  64 bpm    Heart Rate (Exercise)  105 bpm    Heart Rate (Exit)  68 bpm    Oxygen  Saturation (Admit)  97 %    Rating of Perceived Exertion (Exercise)  11       Nutrition:  Target Goals: Understanding of nutrition guidelines, daily intake of sodium <1539m, cholesterol <2054m calories 30% from fat and 7% or less from saturated fats, daily to have 5 or more servings of fruits and vegetables.  Biometrics: Pre Biometrics - 10/26/18 1437      Pre Biometrics   Height  5' 10.5" (1.791 m)    Weight  178 lb 9.6 oz (81 kg)    Waist Circumference  37 inches    Hip Circumference  40 inches    Waist to Hip Ratio  0.92 %    BMI (Calculated)  25.26    Single Leg Stand  28 seconds        Nutrition Therapy Plan and Nutrition Goals: Nutrition Therapy & Goals - 10/26/18 1355      Intervention Plan   Intervention  Prescribe, educate and counsel regarding individualized specific dietary modifications aiming towards targeted core components such as weight, hypertension, lipid management, diabetes, heart failure and other comorbidities.;Nutrition handout(s) given to patient.    Expected Outcomes  Short Term Goal: Understand basic principles of dietary content, such as calories, fat, sodium, cholesterol and nutrients.;Short Term Goal: A plan has been developed with personal nutrition goals set during dietitian appointment.;Long Term Goal: Adherence to prescribed nutrition plan.       Nutrition Assessments: Nutrition Assessments - 10/26/18 1355      MEDFICTS Scores   Pre Score  43       Nutrition Goals Re-Evaluation:   Nutrition Goals Discharge (Final Nutrition Goals Re-Evaluation):   Psychosocial: Target Goals: Acknowledge presence or absence of significant depression and/or stress, maximize coping skills, provide positive support system. Participant is able to verbalize types and ability to use techniques and skills needed for reducing stress and depression.   Initial Review & Psychosocial Screening: Initial Psych Review & Screening - 10/26/18 1347      Initial  Review   Current issues with  Current Stress Concerns    Source of Stress Concerns  Unable to perform  yard/household activities    Comments  Leland's biggest stressor currently is trying to get his blood sugar controlled. He and his wife think it is a medication issue and are trying to figure it out. 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       Family Dynamics   Good Support System?  Yes   wife      Barriers   Psychosocial barriers to participate in program  There are no identifiable barriers or psychosocial needs.;The patient should benefit from training in stress management and relaxation.      Screening Interventions   Interventions  Encouraged to exercise;Program counselor consult;Provide feedback about the scores to participant;To provide support and resources with identified psychosocial needs    Expected Outcomes  Short Term goal: Utilizing psychosocial counselor, staff and physician to assist with identification of specific Stressors or current issues interfering with healing process. Setting desired goal for each stressor or current issue identified.;Long Term Goal: Stressors or current issues are controlled or eliminated.;Short Term goal: Identification and review with participant of any Quality of Life or Depression concerns found by scoring the questionnaire.;Long Term goal: The participant improves quality of Life and PHQ9 Scores as seen by post scores and/or verbalization of changes       Quality of Life Scores:  Quality of Life - 10/26/18 1350      Quality of Life   Select  Quality of Life      Quality of Life Scores   Health/Function Pre  26 %    Socioeconomic Pre  28.43 %    Psych/Spiritual Pre  30 %    Family Pre  27.6 %    GLOBAL Pre  27.56 %      Scores of 19 and below usually indicate a poorer quality of life in these areas.  A difference of  2-3 points is a clinically meaningful difference.   A difference of 2-3 points in the total score of the Quality of Life Index has been associated with significant improvement in overall quality of life, self-image, physical symptoms, and general health in studies assessing change in quality of life.  PHQ-9: Recent Review Flowsheet Data    Depression screen [facility] 2/9 10/26/2018 03/30/2016 11/05/2015   Decreased Interest 0 0 0   Down, Depressed, Hopeless 0 0 0   PHQ - 2 Score 0 0 0   Altered sleeping 0 - -   Tired, decreased energy 1 - -   Change in appetite 0 - -   Feeling bad or failure about yourself  0 - -   Trouble concentrating 0 - -   Moving slowly or fidgety/restless 0 - -   Suicidal thoughts 0 - -   PHQ-9 Score 1 - -   Difficult doing work/chores Not difficult at all - -     Interpretation of Total Score  Total Score Depression Severity:  1-4 = Minimal depression, 5-9 = Mild depression, 10-14 = Moderate depression, 15-19 = Moderately severe depression, 20-27 = Severe depression   Psychosocial Evaluation and Intervention:   Psychosocial Re-Evaluation:   Psychosocial Discharge (Final Psychosocial Re-Evaluation):   Vocational Rehabilitation: Provide vocational rehab assistance to qualifying candidates.   Vocational Rehab Evaluation & Intervention: Vocational Rehab - 10/26/18 1347      Initial Vocational Rehab Evaluation & Intervention   Assessment shows need for Vocational Rehabilitation  No  Education: Education Goals: Education classes will be provided on a variety of topics geared toward better understanding of heart health and risk factor modification. Participant will state understanding/return demonstration of topics presented as noted by education test scores.  Learning Barriers/Preferences: Learning Barriers/Preferences - 10/26/18 1342      Learning Barriers/Preferences   Learning Barriers  None    Learning Preferences  None       Education Topics:  AED/CPR: - Group verbal and written instruction  with the use of models to demonstrate the basic use of the AED with the basic ABC's of resuscitation.   General Nutrition Guidelines/Fats and Fiber: -Group instruction provided by verbal, written material, models and posters to present the general guidelines for heart healthy nutrition. Gives an explanation and review of dietary fats and fiber.   Controlling Sodium/Reading Food Labels: -Group verbal and written material supporting the discussion of sodium use in heart healthy nutrition. Review and explanation with models, verbal and written materials for utilization of the food label.   Exercise Physiology & General Exercise Guidelines: - Group verbal and written instruction with models to review the exercise physiology of the cardiovascular system and associated critical values. Provides general exercise guidelines with specific guidelines to those with heart or lung disease.    Aerobic Exercise & Resistance Training: - Gives group verbal and written instruction on the various components of exercise. Focuses on aerobic and resistive training programs and the benefits of this training and how to safely progress through these programs..   Flexibility, Balance, Mind/Body Relaxation: Provides group verbal/written instruction on the benefits of flexibility and balance training, including mind/body exercise modes such as yoga, pilates and tai chi.  Demonstration and skill practice provided.   Stress and Anxiety: - Provides group verbal and written instruction about the health risks of elevated stress and causes of high stress.  Discuss the correlation between heart/lung disease and anxiety and treatment options. Review healthy ways to manage with stress and anxiety.   Depression: - Provides group verbal and written instruction on the correlation between heart/lung disease and depressed mood, treatment options, and the stigmas associated with seeking treatment.   Anatomy & Physiology of the  Heart: - Group verbal and written instruction and models provide basic cardiac anatomy and physiology, with the coronary electrical and arterial systems. Review of Valvular disease and Heart Failure   Cardiac Procedures: - Group verbal and written instruction to review commonly prescribed medications for heart disease. Reviews the medication, class of the drug, and side effects. Includes the steps to properly store meds and maintain the prescription regimen. (beta blockers and nitrates)   Cardiac Medications I: - Group verbal and written instruction to review commonly prescribed medications for heart disease. Reviews the medication, class of the drug, and side effects. Includes the steps to properly store meds and maintain the prescription regimen.   Cardiac Medications II: -Group verbal and written instruction to review commonly prescribed medications for heart disease. Reviews the medication, class of the drug, and side effects. (all other drug classes)    Go Sex-Intimacy & Heart Disease, Get SMART - Goal Setting: - Group verbal and written instruction through game format to discuss heart disease and the return to sexual intimacy. Provides group verbal and written material to discuss and apply goal setting through the application of the S.M.A.R.T. Method.   Other Matters of the Heart: - Provides group verbal, written materials and models to describe Stable Angina and Peripheral Artery. Includes description of the disease process  and treatment options available to the cardiac patient.   Exercise & Equipment Safety: - Individual verbal instruction and demonstration of equipment use and safety with use of the equipment.   Cardiac Rehab from 10/26/2018 in [facility] Cardiac and Pulmonary Rehab  Date  10/26/18  Educator  [facility]  Instruction Review Code  1- Verbalizes Understanding      Infection Prevention: - Provides verbal and written material to individual with discussion of infection control  including proper hand washing and proper equipment cleaning during exercise session.   Cardiac Rehab from 10/26/2018 in [facility] Cardiac and Pulmonary Rehab  Date  10/26/18  Educator  [facility]  Instruction Review Code  1- Verbalizes Understanding      Falls Prevention: - Provides verbal and written material to individual with discussion of falls prevention and safety.   Cardiac Rehab from 10/26/2018 in [facility] Cardiac and Pulmonary Rehab  Date  10/26/18  Educator  [facility]  Instruction Review Code  1- Verbalizes Understanding      Diabetes: - Individual verbal and written instruction to review signs/symptoms of diabetes, desired ranges of glucose level fasting, after meals and with exercise. Acknowledge that pre and post exercise glucose checks will be done for 3 sessions at entry of program.   Cardiac Rehab from 10/26/2018 in [facility] Cardiac and Pulmonary Rehab  Date  10/26/18  Educator  [facility]  Instruction Review Code  1- Verbalizes Understanding      Know Your Numbers and Risk Factors: -Group verbal and written instruction about important numbers in your health.  Discussion of what are risk factors and how they play a role in the disease process.  Review of Cholesterol, Blood Pressure, Diabetes, and BMI and the role they play in your overall health.   Sleep Hygiene: -Provides group verbal and written instruction about how sleep can affect your health.  Define sleep hygiene, discuss sleep cycles and impact of sleep habits. Review good sleep hygiene tips.    Other: -Provides group and verbal instruction on various topics (see comments)   Knowledge Questionnaire Score: Knowledge Questionnaire Score - 10/26/18 1342      Knowledge Questionnaire Score   Pre Score  22/26   correct answers reviewed with Casandra Doffing. Focus on exercise      Core Components/Risk Factors/Patient Goals at Admission: Personal Goals and Risk Factors at Admission - 10/26/18 1341      Core Components/Risk Factors/Patient Goals on  Admission    Weight Management  Yes;Weight Maintenance    Intervention  Weight Management: Develop a combined nutrition and exercise program designed to reach desired caloric intake, while maintaining appropriate intake of nutrient and fiber, sodium and fats, and appropriate energy expenditure required for the weight goal.;Weight Management: Provide education and appropriate resources to help participant work on and attain dietary goals.    Expected Outcomes  Short Term: Continue to assess and modify interventions until short term weight is achieved;Long Term: Adherence to nutrition and physical activity/exercise program aimed toward attainment of established weight goal;Weight Maintenance: Understanding of the daily nutrition guidelines, which includes 25-35% calories from fat, 7% or less cal from saturated fats, less than 21m cholesterol, less than 1.5gm of sodium, & 5 or more servings of fruits and vegetables daily;Understanding recommendations for meals to include 15-35% energy as protein, 25-35% energy from fat, 35-60% energy from carbohydrates, less than 2026mof dietary cholesterol, 20-35 gm of total fiber daily;Understanding of distribution of calorie intake throughout the day with the consumption of 4-5 meals/snacks    Diabetes  Yes    Intervention  Provide education about signs/symptoms and action to take for hypo/hyperglycemia.;Provide education about proper nutrition, including hydration, and aerobic/resistive exercise prescription along with prescribed medications to achieve blood glucose in normal ranges: Fasting glucose 65-99 mg/dL    Expected Outcomes  Short Term: Participant verbalizes understanding of the signs/symptoms and immediate care of hyper/hypoglycemia, proper foot care and importance of medication, aerobic/resistive exercise and nutrition plan for blood glucose control.;Long Term: Attainment of HbA1C < 7%.    Hypertension  Yes    Intervention  Provide education on lifestyle  modifcations including regular physical activity/exercise, weight management, moderate sodium restriction and increased consumption of fresh fruit, vegetables, and low fat dairy, alcohol moderation, and smoking cessation.;Monitor prescription use compliance.    Expected Outcomes  Short Term: Continued assessment and intervention until BP is < 140/91m HG in hypertensive participants. < 130/827mHG in hypertensive participants with diabetes, heart failure or chronic kidney disease.;Long Term: Maintenance of blood pressure at goal levels.    Lipids  Yes    Intervention  Provide education and support for participant on nutrition & aerobic/resistive exercise along with prescribed medications to achieve LDL <7053mHDL >69m59m  Expected Outcomes  Short Term: Participant states understanding of desired cholesterol values and is compliant with medications prescribed. Participant is following exercise prescription and nutrition guidelines.;Long Term: Cholesterol controlled with medications as prescribed, with individualized exercise RX and with personalized nutrition plan. Value goals: LDL < 70mg47mL > 40 mg.       Core Components/Risk Factors/Patient Goals Review:    Core Components/Risk Factors/Patient Goals at Discharge (Final Review):    ITP Comments: ITP Comments    Row Name 10/26/18 1337           ITP Comments  Med Review completed. Initial ITP created. Diagnosis can be found in HCL 10/28          Comments: Initial ITP

## 2018-10-26 NOTE — Progress Notes (Signed)
Daily Session Note  Patient Details  Name: Daniel Mendez MRN: [id number] Date of Birth: 1941/06/13 Referring Provider:     Cardiac Rehab from 10/26/2018 in [facility] Cardiac and Pulmonary Rehab  Referring Provider  [name]      Encounter Date: 10/26/2018  Check In: Session Check In - 10/26/18 1336      Check-In   Supervising physician immediately available to respond to emergencies  See telemetry face sheet for immediately available ER MD    Location  [facility]-Cardiac & Pulmonary Rehab    Staff Present  Renita Papa, RN Vickki Hearing, BA, ACSM CEP, Exercise Physiologist    Medication changes reported      No    Fall or balance concerns reported     No    Warm-up and Cool-down  Not performed (comment)   med review   Resistance Training Performed  Yes    VAD Patient?  No    PAD/SET Patient?  No      Pain Assessment   Currently in Pain?  No/denies        Exercise Prescription Changes - 10/26/18 1400      Response to Exercise   Blood Pressure (Admit)  148/60    Blood Pressure (Exercise)  180/66    Blood Pressure (Exit)  144/68    Heart Rate (Admit)  64 bpm    Heart Rate (Exercise)  105 bpm    Heart Rate (Exit)  68 bpm    Oxygen Saturation (Admit)  97 %    Rating of Perceived Exertion (Exercise)  11       Social History   Tobacco Use  Smoking Status Never Smoker  Smokeless Tobacco Never Used    Goals Met:  Proper associated with RPD/PD & O2 Sat Exercise tolerated well Personal goals reviewed No report of cardiac concerns or symptoms Strength training completed today  Goals Unmet:  Not Applicable  Comments: Med Review completed   Dr. Emily Filbert is Medical Director for [place] and [facility].

## 2018-10-26 NOTE — Patient Instructions (Signed)
Patient Instructions  Patient Details  Name: Daniel Mendez MRN: [id number] Date of Birth: Jul 19, 1941 Referring Provider:  Belva Crome, MD  Below are your personal goals for exercise, nutrition, and risk factors. Our goal is to help you stay on track towards obtaining and maintaining these goals. We will be discussing your progress on these goals with you throughout the program.  Initial Exercise Prescription: Initial Exercise Prescription - 10/26/18 1400      Date of Initial Exercise RX and Referring Provider   Date  10/26/18    Referring Provider  [name]      Treadmill   MPH  2.5    Grade  0.5    Minutes  15    METs  3.09      Recumbant Bike   Level  3    RPM  60    Watts  32    Minutes  15    METs  3      REL-XR   Level  3    Speed  50    Minutes  15    METs  3      Prescription Details   Frequency (times per week)  3    Duration  Progress to 45 minutes of aerobic exercise without signs/symptoms of physical distress      Intensity   THRR 40-80% of Max Heartrate  99-127    Ratings of Perceived Exertion  11-13    Perceived Dyspnea  0-4      Resistance Training   Training Prescription  Yes    Weight  3 lb    Reps  10-15       Exercise Goals: Frequency: Be able to perform aerobic exercise two to three times per week in program working toward 2-5 days per week of home exercise.  Intensity: Work with a perceived exertion of 11 (fairly light) - 15 (hard) while following your exercise prescription.  We will make changes to your prescription with you as you progress through the program.   Duration: Be able to do 30 to 45 minutes of continuous aerobic exercise in addition to a 5 minute warm-up and a 5 minute cool-down routine.   Nutrition Goals: Your personal nutrition goals will be established when you do your nutrition analysis with the dietician.  The following are general nutrition guidelines to follow: Cholesterol < 200mg /day Sodium <  1500mg /day Fiber: Men over 50 yrs - 30 grams per day  Personal Goals: Personal Goals and Risk Factors at Admission - 10/26/18 1341      Core Components/Risk Factors/Patient Goals on Admission    Weight Management  Yes;Weight Maintenance    Intervention  Weight Management: Develop a combined nutrition and exercise program designed to reach desired caloric intake, while maintaining appropriate intake of nutrient and fiber, sodium and fats, and appropriate energy expenditure required for the weight goal.;Weight Management: Provide education and appropriate resources to help participant work on and attain dietary goals.    Expected Outcomes  Short Term: Continue to assess and modify interventions until short term weight is achieved;Long Term: Adherence to nutrition and physical activity/exercise program aimed toward attainment of established weight goal;Weight Maintenance: Understanding of the daily nutrition guidelines, which includes 25-35% calories from fat, 7% or less cal from saturated fats, less than 200mg  cholesterol, less than 1.5gm of sodium, & 5 or more servings of fruits and vegetables daily;Understanding recommendations for meals to include 15-35% energy as protein, 25-35% energy from fat, 35-60% energy from  carbohydrates, less than 200mg  of dietary cholesterol, 20-35 gm of total fiber daily;Understanding of distribution of calorie intake throughout the day with the consumption of 4-5 meals/snacks    Diabetes  Yes    Intervention  Provide education about signs/symptoms and action to take for hypo/hyperglycemia.;Provide education about proper nutrition, including hydration, and aerobic/resistive exercise prescription along with prescribed medications to achieve blood glucose in normal ranges: Fasting glucose 65-99 mg/dL    Expected Outcomes  Short Term: Participant verbalizes understanding of the signs/symptoms and immediate care of hyper/hypoglycemia, proper foot care and importance of  medication, aerobic/resistive exercise and nutrition plan for blood glucose control.;Long Term: Attainment of HbA1C < 7%.    Hypertension  Yes    Intervention  Provide education on lifestyle modifcations including regular physical activity/exercise, weight management, moderate sodium restriction and increased consumption of fresh fruit, vegetables, and low fat dairy, alcohol moderation, and smoking cessation.;Monitor prescription use compliance.    Expected Outcomes  Short Term: Continued assessment and intervention until BP is < 140/62mm HG in hypertensive participants. < 130/23mm HG in hypertensive participants with diabetes, heart failure or chronic kidney disease.;Long Term: Maintenance of blood pressure at goal levels.    Lipids  Yes    Intervention  Provide education and support for participant on nutrition & aerobic/resistive exercise along with prescribed medications to achieve LDL 70mg , HDL >40mg .    Expected Outcomes  Short Term: Participant states understanding of desired cholesterol values and is compliant with medications prescribed. Participant is following exercise prescription and nutrition guidelines.;Long Term: Cholesterol controlled with medications as prescribed, with individualized exercise RX and with personalized nutrition plan. Value goals: LDL < 70mg , HDL > 40 mg.       Tobacco Use Initial Evaluation: Social History   Tobacco Use  Smoking Status Never Smoker  Smokeless Tobacco Never Used    Exercise Goals and Review: Exercise Goals    Row Name 10/26/18 1438             Exercise Goals   Increase Physical Activity  Yes       Intervention  Provide advice, education, support and counseling about physical activity/exercise needs.;Develop an individualized exercise prescription for aerobic and resistive training based on initial evaluation findings, risk stratification, comorbidities and participant's personal goals.       Expected Outcomes  Short Term: Attend rehab  on a regular basis to increase amount of physical activity.;Long Term: Exercising regularly at least 3-5 days a week.;Long Term: Add in home exercise to make exercise part of routine and to increase amount of physical activity.       Increase Strength and Stamina  Yes       Intervention  Provide advice, education, support and counseling about physical activity/exercise needs.;Develop an individualized exercise prescription for aerobic and resistive training based on initial evaluation findings, risk stratification, comorbidities and participant's personal goals.       Expected Outcomes  Short Term: Increase workloads from initial exercise prescription for resistance, speed, and METs.;Short Term: Perform resistance training exercises routinely during rehab and add in resistance training at home;Long Term: Improve cardiorespiratory fitness, muscular endurance and strength as measured by increased METs and functional capacity (6MWT)       Able to understand and use rate of perceived exertion (RPE) scale  Yes       Intervention  Provide education and explanation on how to use RPE scale       Expected Outcomes  Short Term: Able to use RPE daily in  rehab to express subjective intensity level;Long Term:  Able to use RPE to guide intensity level when exercising independently       Knowledge and understanding of Target Heart Rate Range (THRR)  Yes       Intervention  Provide education and explanation of THRR including how the numbers were predicted and where they are located for reference       Expected Outcomes  Short Term: Able to state/look up THRR;Short Term: Able to use daily as guideline for intensity in rehab;Long Term: Able to use THRR to govern intensity when exercising independently       Able to check pulse independently  Yes       Intervention  Provide education and demonstration on how to check pulse in carotid and radial arteries.;Review the importance of being able to check your own pulse for safety  during independent exercise       Expected Outcomes  Short Term: Able to explain why pulse checking is important during independent exercise;Long Term: Able to check pulse independently and accurately       Understanding of Exercise Prescription  Yes       Intervention  Provide education, explanation, and written materials on patient's individual exercise prescription       Expected Outcomes  Short Term: Able to explain program exercise prescription;Long Term: Able to explain home exercise prescription to exercise independently          Copy of goals given to participant.

## 2018-10-27 ENCOUNTER — Other Ambulatory Visit: Payer: Self-pay | Admitting: Surgery

## 2018-10-27 DIAGNOSIS — Z951 Presence of aortocoronary bypass graft: Secondary | ICD-10-CM

## 2018-10-30 ENCOUNTER — Ambulatory Visit (INDEPENDENT_AMBULATORY_CARE_PROVIDER_SITE_OTHER): Payer: Self-pay | Admitting: Physician Assistant

## 2018-10-30 ENCOUNTER — Ambulatory Visit
Admission: RE | Admit: 2018-10-30 | Discharge: 2018-10-30 | Disposition: A | Discharge status: Home / Self Care | Payer: Medicare Other | Source: Ambulatory Visit | Attending: Surgery | Admitting: Surgery

## 2018-10-30 ENCOUNTER — Encounter: Payer: Medicare Other | Admitting: *Deleted

## 2018-10-30 VITALS — BP 135/63 | HR 62 | Resp 20 | Ht 70.0 in | Wt 179.0 lb

## 2018-10-30 DIAGNOSIS — Z951 Presence of aortocoronary bypass graft: Secondary | ICD-10-CM | POA: Diagnosis not present

## 2018-10-30 DIAGNOSIS — Z952 Presence of prosthetic heart valve: Secondary | ICD-10-CM

## 2018-10-30 DIAGNOSIS — Z953 Presence of xenogenic heart valve: Secondary | ICD-10-CM

## 2018-10-30 LAB — GLUCOSE, CAPILLARY
Glucose-Capillary: 146 mg/dL — ABNORMAL HIGH (ref 70–99)
Glucose-Capillary: 131 mg/dL — ABNORMAL HIGH (ref 70–99)

## 2018-10-30 NOTE — Progress Notes (Signed)
[address]       [place] [address]             [phone]      Daniel Mendez is a 77 y.o. male patient who underwent a CABG x 1 and AVR on 09/18/2018.  His hospitalization was complicated by atrial fibrillation with RVR.  It was controlled with medication.  He was sent home on oral amiodarone and a beta-blocker.  1. S/P CABG x 1   2. S/P aortic valve replacement with bioprosthetic valve    Past Medical History:  Diagnosis Date  . Anginal pain ([place])   . Arthritis    hand- right  . Coronary artery disease   . Diabetes ([place])    type 2  . GERD (gastroesophageal reflux disease)   . Heart murmur    discovered in 2015 - followed by Dr. Daneen Schick  . History of hiatal hernia   . History of kidney stones   . HTN (hypertension)   . Hypercalcemia   . Hyperglyceridemia   . Hyperlipidemia   . Mild aortic stenosis Oct 2014  . Peripheral vascular disease ([place])   . Pneumonia 07/28/2018  . Shortness of breath dyspnea    on exertion  . Sleep apnea    no cpap   Past Surgical History Pertinent Negatives:  Procedure Date Noted  . ABDOMINAL HYSTERECTOMY 03/10/2015  . APPENDECTOMY 03/10/2015  . BACK SURGERY 03/10/2015  . BRAIN SURGERY 03/10/2015  . BREAST SURGERY 03/10/2015  . CARDIAC VALVE REPLACEMENT 03/10/2015  . CHOLECYSTECTOMY 03/10/2015  . COLON SURGERY 03/10/2015  . CORONARY ANGIOPLASTY 03/10/2015  . CORONARY ARTERY BYPASS GRAFT 03/10/2015  . DIAGNOSTIC LAPAROSCOPY 03/10/2015  . DILATION AND CURETTAGE OF UTERUS 03/10/2015  . EYE SURGERY 03/10/2015  . INSERT / REPLACE / REMOVE PACEMAKER 03/10/2015  . JOINT REPLACEMENT 03/10/2015  . MASTECTOMY 03/10/2015  . TONSILLECTOMY 03/10/2015  . TUBAL LIGATION 03/10/2015   Scheduled Meds: Current Outpatient Medications on File Prior to Visit  Medication Sig Dispense Refill  . acetaminophen (TYLENOL) 325 MG tablet Take 2 tablets (650 mg total) by mouth every 6 (six) hours as needed for mild pain.    [name] Kitchen  amiodarone (PACERONE) 200 MG tablet Take 1 tablet (200 mg total) by mouth every 12 (twelve) hours. Stay on 200 mg a day - stop on December 15th, 2019 30 tablet 1  . amLODipine (NORVASC) 5 MG tablet Take 1 tablet (5 mg total) by mouth daily. 90 tablet 3  . aspirin 325 MG EC tablet TAKE 1 TABLET BY MOUTH EVERY DAY 30 tablet 0  . atorvastatin (LIPITOR) 20 MG tablet Take 20 mg by mouth daily at 12 noon.     . cyclobenzaprine (FLEXERIL) 5 MG tablet Take 2.5 mg by mouth daily as needed for muscle spasms.     [name] Kitchen glimepiride (AMARYL) 4 MG tablet Take 4 mg by mouth daily with breakfast.    . hydrochlorothiazide (MICROZIDE) 12.5 MG capsule Take 1 capsule (12.5 mg total) by mouth daily. 90 capsule 3  . metFORMIN (GLUCOPHAGE) 500 MG tablet Take 1,000 mg by mouth 2 (two) times daily.     . metoprolol succinate (TOPROL-XL) 25 MG 24 hr tablet Take 12.5 mg by mouth daily.    . Omega-3 Fatty Acids (FISH OIL) 1000 MG CAPS Take 1,000 mg by mouth 2 (two) times daily.     [name] Kitchen omeprazole (PRILOSEC) 20 MG capsule Take 20 mg by mouth every other day. At noon    .  ONE TOUCH ULTRA TEST test strip 1 each by Other route daily.     Glory Rosebush DELICA LANCETS 40J MISC 1 each by Other route daily.     . sitaGLIPtin (JANUVIA) 100 MG tablet Take 100 mg by mouth daily with lunch.     . tamsulosin (FLOMAX) 0.4 MG CAPS capsule Take 0.4 mg by mouth every evening.    [name] Kitchen telmisartan (MICARDIS) 40 MG tablet Take 20 mg by mouth daily.    [name] Kitchen testosterone cypionate (DEPOTESTOSTERONE CYPIONATE) 200 MG/ML injection Inject 200 mg into the muscle every 14 (fourteen) days.    . traMADol (ULTRAM) 50 MG tablet Take 50 mg by mouth every 4-6 hours PRN severe pain 28 tablet 0   No current facility-administered medications on file prior to visit.     Blood pressure 135/63, pulse 62, resp. rate 20, height 5\' 10"  (1.778 m), weight 179 lb (81.2 kg), SpO2 93 %.   Subjective : Daniel Mendez is a 77 year old male status post aortic valve replacement and  coronary bypass grafting x1 with Dr. Cyndia Bent.  He returns today for his routine 4-week follow-up visit.  It has been about 6 weeks since surgery.  He is currently involved in a cardiac rehab program over at [facility].  His main complaints today are dizziness with standing which has improved since his beta-blocker dosage has been cut in half and a hand tremor that was present before surgery but is gotten slightly worse after surgery.  Objective  Cor: Regular rate and rhythm, no murmur Pulm: Clear to auscultation bilaterally in all fields Abd: Soft, nontender Ext: No edema Wound: Clean and dry sternotomy incision.  CLINICAL DATA:  Status post CABG on 09/18/2018.  EXAM: CHEST - 2 VIEW  COMPARISON:  Chest x-rays dated 09/20/2018 and 09/14/2018  FINDINGS: The heart size and mediastinal contours are within normal limits. Prosthetic aortic valve. CABG. Both lungs are clear. The visualized skeletal structures are unremarkable.  No pneumothorax.  IMPRESSION: Resolution of bibasilar atelectasis.  No acute abnormalities.   Electronically Signed   By: Lorriane Shire M.D.   On: 10/30/2018 13:34   Assessment & Plan   Overall, Daniel Mendez has been doing quite well since surgery.  We addressed his hand tremor today which was present before surgery but he feels it has become worse.  His wife shares that he has difficulty at times writing his name.  Shaking and fatigue are side effects of amiodarone which is a new medication that the patient started while in the hospital for his atrial fibrillation.  He is currently in normal sinus rhythm with a rate in the 60s.  He has been weaning off this medication and is due to stop it completely on December 15.  Once the medication is stopped I asked the patient and wife to reevaluate his tremor and see if it had improved at all.  If it remains the same or gets worse it might be something that needs to be addressed with his primary care  provider.  He does have an appointment in a few weeks.  He also has a follow-up appointment scheduled with Dr. [name].  He has had no pain since surgery and had stopped taking the narcotic pain medicine while in the hospital.  Today, I cleared him for driving.  He has started a cardiac rehab program at [facility] and has his first session today.  He is excited to be able to increase his weight limitation slowly over the next few weeks  and to be able to do more cardio type exercises.  He feels he has made vast improvement since surgery.  I reviewed his chest x-ray with him today which was stable without pleural effusion.  I did note that he had some hyperkalemia during his last blood tests.  I suggested avoiding bananas which he eats every day and getting labs redrawn in a couple of weeks.  He is on HCTZ which was recently started by Dr. [name] for his blood pressure.  He had no further questions at this time.  He is encouraged to call our office if any questions or concerns arise.  We do not need to schedule any routine follow-up for him with our office.  No medication changes were made today.  Elgie Collard 10/30/2018

## 2018-10-30 NOTE — Progress Notes (Signed)
Daily Session Note  Patient Details  Name: Daniel Mendez MRN: [id number] Date of Birth: August 06, 1941 Referring Provider:     Cardiac Rehab from 10/26/2018 in [facility] Cardiac and Pulmonary Rehab  Referring Provider  [name]      Encounter Date: 10/30/2018  Check In: Session Check In - 10/30/18 1720      Check-In   Supervising physician immediately available to respond to emergencies  See telemetry face sheet for immediately available ER MD    Location  [facility]-Cardiac & Pulmonary Rehab    Staff Present  Earlean Shawl, BS, ACSM CEP, Exercise Physiologist;Carroll Enterkin, RN, BSN;Laureen Brown, BS, RRT, Respiratory Therapist    Medication changes reported      No    Fall or balance concerns reported     No    Tobacco Cessation  No Change    Warm-up and Cool-down  Performed as group-led instruction    Resistance Training Performed  Yes    VAD Patient?  No    PAD/SET Patient?  No      Pain Assessment   Currently in Pain?  No/denies    Multiple Pain Sites  No          Social History   Tobacco Use  Smoking Status Never Smoker  Smokeless Tobacco Never Used    Goals Met:  Exercise tolerated well Personal goals reviewed No report of cardiac concerns or symptoms Strength training completed today  Goals Unmet:  Not Applicable  Comments: First full day of exercise!  Patient was oriented to gym and equipment including functions, settings, policies, and procedures.  Patient's individual exercise prescription and treatment plan were reviewed.  All starting workloads were established based on the results of the 6 minute walk test done at initial orientation visit.  The plan for exercise progression was also introduced and progression will be customized based on patient's performance and goals.    Dr. Emily Filbert is Medical Director for [place] and [facility].

## 2018-10-30 NOTE — Patient Instructions (Signed)
You are encouraged to enroll and participate in the outpatient cardiac rehab program beginning as soon as practical.  You may return to driving an automobile as long as you are no longer requiring oral narcotic pain relievers during the daytime.  It would be wise to start driving only short distances during the daylight and gradually increase from there as you feel comfortable.

## 2018-11-01 ENCOUNTER — Encounter: Payer: Medicare Other | Admitting: *Deleted

## 2018-11-01 ENCOUNTER — Other Ambulatory Visit: Payer: Self-pay | Admitting: Physician Assistant

## 2018-11-01 DIAGNOSIS — Z951 Presence of aortocoronary bypass graft: Secondary | ICD-10-CM

## 2018-11-01 NOTE — Progress Notes (Signed)
Daily Session Note  Patient Details  Name: Daniel Mendez MRN: [id number] Date of Birth: [birth year] Referring Provider:     Cardiac Rehab from 10/26/2018 in [facility] Cardiac and Pulmonary Rehab  Referring Provider  [name]      Encounter Date: 11/01/2018  Check In: Session Check In - 11/01/18 1626      Check-In   Supervising physician immediately available to respond to emergencies  See telemetry face sheet for immediately available ER MD    Location  [facility]-Cardiac & Pulmonary Rehab    Staff Present  Gerlene Burdock, RN, BSN;Joseph [employer], [place], ACSM CEP, Exercise Physiologist    Medication changes reported      No    Fall or balance concerns reported     No    Tobacco Cessation  No Change    Warm-up and Cool-down  Performed on first and last piece of equipment    Resistance Training Performed  Yes    VAD Patient?  No    PAD/SET Patient?  No      Pain Assessment   Currently in Pain?  No/denies          Social History   Tobacco Use  Smoking Status Never Smoker  Smokeless Tobacco Never Used    Goals Met:  Proper associated with RPD/PD & O2 Sat No report of cardiac concerns or symptoms Strength training completed today  Goals Unmet:  Not Applicable  Comments:     Dr. Emily Filbert is Medical Director for [place] and [facility].

## 2018-11-02 DIAGNOSIS — Z951 Presence of aortocoronary bypass graft: Secondary | ICD-10-CM | POA: Diagnosis not present

## 2018-11-02 LAB — GLUCOSE, CAPILLARY
GLUCOSE-CAPILLARY: 153 mg/dL — AB (ref 70–99)
GLUCOSE-CAPILLARY: 71 mg/dL (ref 70–99)

## 2018-11-02 NOTE — Progress Notes (Signed)
Daily Session Note  Patient Details  Name: Daniel Mendez MRN: [id number] Date of Birth: Jan 23, 1941 Referring Provider:     Cardiac Rehab from 10/26/2018 in [facility] Cardiac and Pulmonary Rehab  Referring Provider  [name]      Encounter Date: 11/02/2018  Check In: Session Check In - 11/02/18 1602      Check-In   Supervising physician immediately available to respond to emergencies  See telemetry face sheet for immediately available ER MD    Location  [facility]-Cardiac & Pulmonary Rehab    Staff Present  Justin Mend Jaci Carrel, BS, ACSM CEP, Exercise Physiologist;Meredith Sherryll Burger, RN BSN    Medication changes reported      No    Fall or balance concerns reported     No    Warm-up and Cool-down  Performed as group-led Higher education careers adviser Performed  Yes    VAD Patient?  No    PAD/SET Patient?  No      Pain Assessment   Currently in Pain?  No/denies          Social History   Tobacco Use  Smoking Status Never Smoker  Smokeless Tobacco Never Used    Goals Met:  Independence with exercise equipment Exercise tolerated well No report of cardiac concerns or symptoms Strength training completed today  Goals Unmet:  Not Applicable  Comments: Pt able to follow exercise prescription today without complaint.  Will continue to monitor for progression.    Dr. Emily Filbert is Medical Director for [place] and [facility].

## 2018-11-04 IMAGING — DX DG ORBITS FOR FOREIGN BODY
2 series · 2 of 2 positions shown · non-contrast
Comparison: None.

CLINICAL DATA: Metal working/exposure; clearance prior to MRI

EXAM:
ORBITS FOR FOREIGN BODY - 2 VIEW

[orbits waters (1 of 2)]
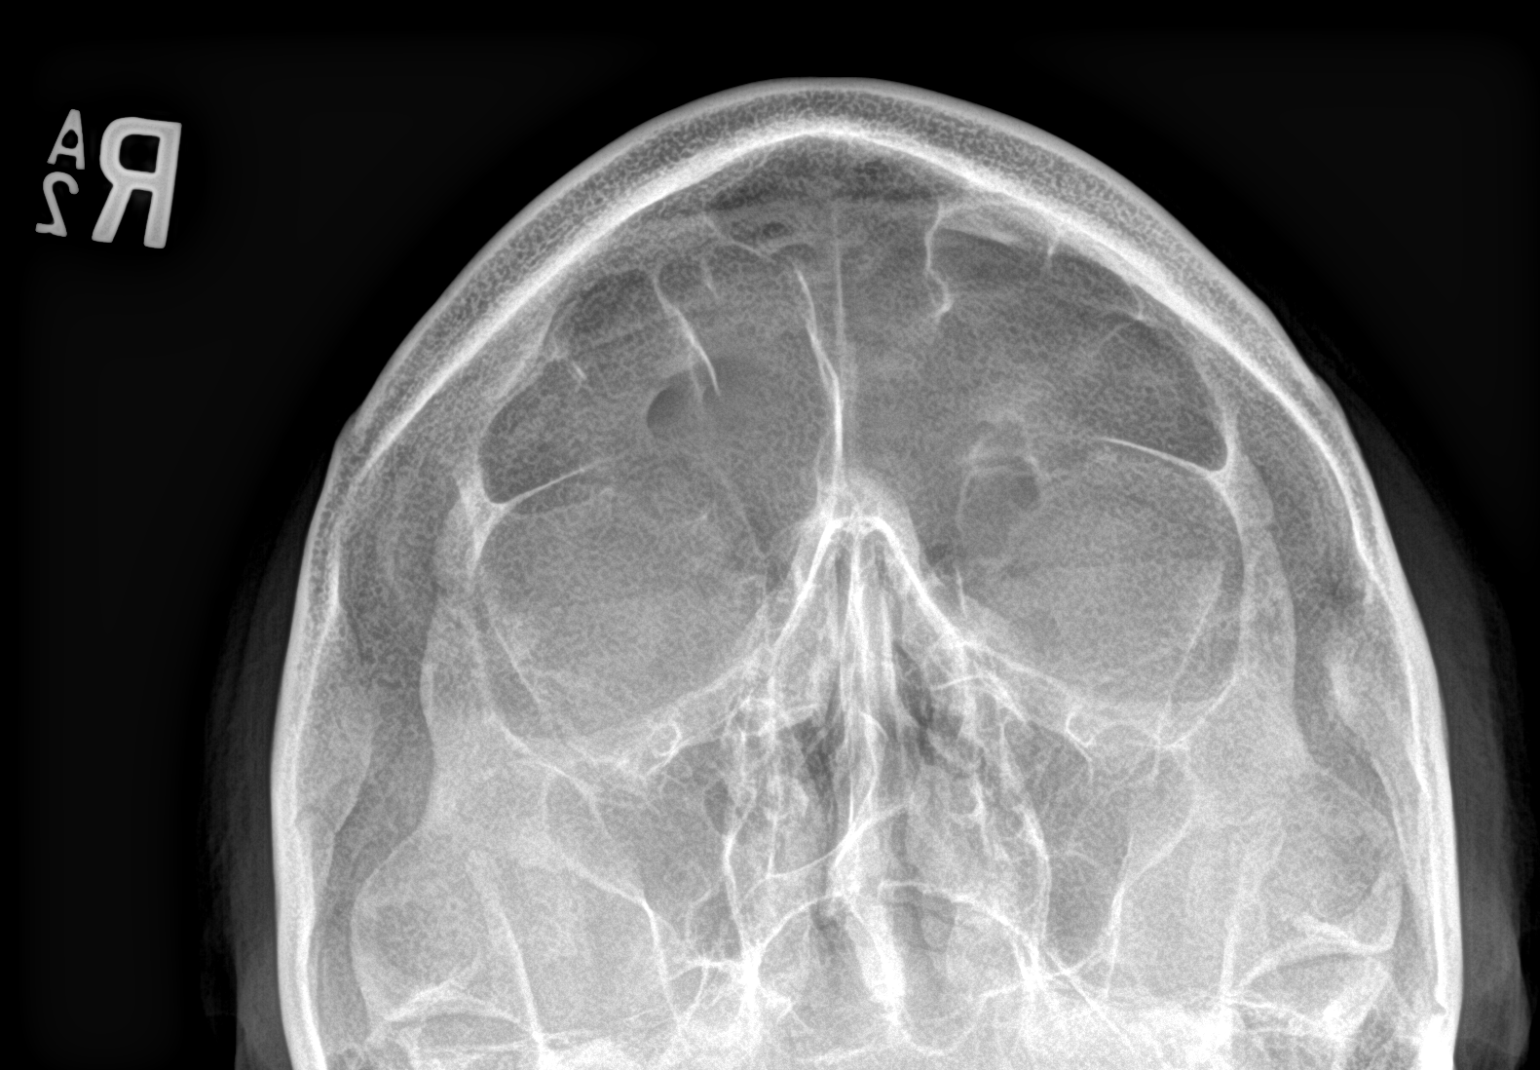

[orbits waters (2 of 2)]
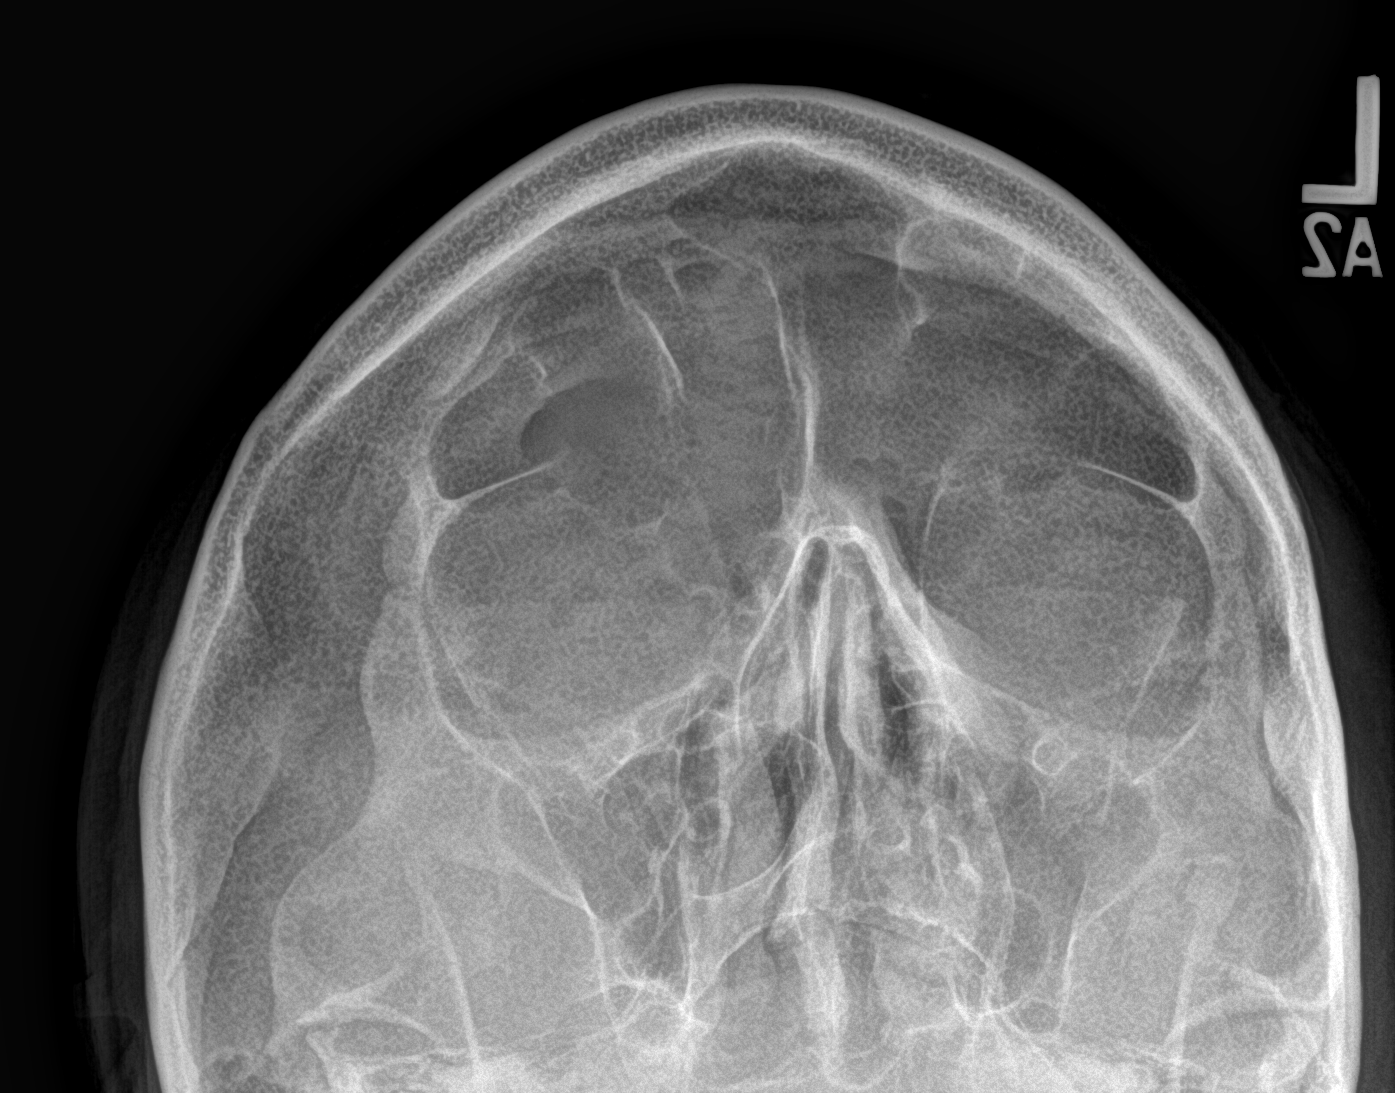

[2 of 2 positions shown; findings below may reference images not displayed]

FINDINGS: There is no evidence of metallic foreign body within the orbits. No
significant bone abnormality identified.
IMPRESSION: No evidence of metallic foreign body within the orbits.

## 2018-11-06 DIAGNOSIS — Z951 Presence of aortocoronary bypass graft: Secondary | ICD-10-CM | POA: Diagnosis not present

## 2018-11-06 DIAGNOSIS — Z952 Presence of prosthetic heart valve: Secondary | ICD-10-CM

## 2018-11-06 LAB — GLUCOSE, CAPILLARY
GLUCOSE-CAPILLARY: 204 mg/dL — AB (ref 70–99)
Glucose-Capillary: 166 mg/dL — ABNORMAL HIGH (ref 70–99)

## 2018-11-06 NOTE — Progress Notes (Signed)
Daily Session Note  Patient Details  Name: Daniel Mendez MRN: [id number] Date of Birth: 1941/09/22 Referring Provider:     Cardiac Rehab from 10/26/2018 in [facility] Cardiac and Pulmonary Rehab  Referring Provider  [name]      Encounter Date: 11/06/2018  Check In: Session Check In - 11/06/18 1710      Check-In   Supervising physician immediately available to respond to emergencies  See telemetry face sheet for immediately available ER MD    Location  [facility]-Cardiac & Pulmonary Rehab    Staff Present  Gerlene Burdock, RN, Moises Blood, BS, ACSM CEP, Exercise Physiologist;Amanda Oletta Darter, [place], ACSM CEP, Exercise Physiologist    Medication changes reported      No    Fall or balance concerns reported     No    Warm-up and Cool-down  Performed as group-led instruction    Resistance Training Performed  Yes    VAD Patient?  No    PAD/SET Patient?  No      Pain Assessment   Currently in Pain?  No/denies    Multiple Pain Sites  No          Social History   Tobacco Use  Smoking Status Never Smoker  Smokeless Tobacco Never Used    Goals Met:  Independence with exercise equipment Exercise tolerated well No report of cardiac concerns or symptoms Strength training completed today  Goals Unmet:  Not Applicable  Comments: Pt able to follow exercise prescription today without complaint.  Will continue to monitor for progression.    Dr. Emily Filbert is Medical Director for [place] and [facility].

## 2018-11-08 ENCOUNTER — Encounter: Payer: Medicare Other | Admitting: *Deleted

## 2018-11-08 DIAGNOSIS — Z952 Presence of prosthetic heart valve: Secondary | ICD-10-CM

## 2018-11-08 DIAGNOSIS — Z951 Presence of aortocoronary bypass graft: Secondary | ICD-10-CM

## 2018-11-08 LAB — GLUCOSE, CAPILLARY
GLUCOSE-CAPILLARY: 95 mg/dL (ref 70–99)
GLUCOSE-CAPILLARY: 136 mg/dL — AB (ref 70–99)

## 2018-11-08 NOTE — Progress Notes (Signed)
Daily Session Note  Patient Details  Name: NAOD SWEETLAND MRN: [id number] Date of Birth: 1941/10/10 Referring Provider:     Cardiac Rehab from 10/26/2018 in [facility] Cardiac and Pulmonary Rehab  Referring Provider  [name]      Encounter Date: 11/08/2018  Check In: Session Check In - 11/08/18 1616      Check-In   Supervising physician immediately available to respond to emergencies  See telemetry face sheet for immediately available ER MD    Location  [facility]-Cardiac & Pulmonary Rehab    Staff Present  Renita Papa, RN BSN;Carroll Enterkin, RN, BSN;Jeanna Durrell BS, Exercise Physiologist    Medication changes reported      No    Fall or balance concerns reported     No    Tobacco Cessation  No Change    Warm-up and Cool-down  Performed as group-led Higher education careers adviser Performed  Yes    VAD Patient?  No    PAD/SET Patient?  No      Pain Assessment   Currently in Pain?  No/denies          Social History   Tobacco Use  Smoking Status Never Smoker  Smokeless Tobacco Never Used    Goals Met:  Independence with exercise equipment Exercise tolerated well No report of cardiac concerns or symptoms Strength training completed today  Goals Unmet:  Not Applicable  Comments: Pt able to follow exercise prescription today without complaint.  Will continue to monitor for progression.    Dr. Emily Filbert is Medical Director for [place] and [facility].

## 2018-11-09 ENCOUNTER — Encounter: Payer: Medicare Other | Admitting: *Deleted

## 2018-11-09 DIAGNOSIS — Z951 Presence of aortocoronary bypass graft: Secondary | ICD-10-CM | POA: Diagnosis not present

## 2018-11-09 DIAGNOSIS — Z952 Presence of prosthetic heart valve: Secondary | ICD-10-CM

## 2018-11-09 LAB — GLUCOSE, CAPILLARY
GLUCOSE-CAPILLARY: 210 mg/dL — AB (ref 70–99)
Glucose-Capillary: 171 mg/dL — ABNORMAL HIGH (ref 70–99)

## 2018-11-09 NOTE — Progress Notes (Signed)
Daily Session Note  Patient Details  Name: Daniel Mendez MRN: [id number] Date of Birth: 08-Jul-1941 Referring Provider:     Cardiac Rehab from 10/26/2018 in [facility] Cardiac and Pulmonary Rehab  Referring Provider  [name]      Encounter Date: 11/09/2018  Check In: Session Check In - 11/09/18 0815      Check-In   Supervising physician immediately available to respond to emergencies  See telemetry face sheet for immediately available ER MD    Location  [facility]-Cardiac & Pulmonary Rehab    Staff Present  Vida Rigger RN, BSN;Jeanna Durrell BS, Exercise Physiologist;Jessica Luan Pulling, MA, RCEP, CCRP, Exercise Physiologist;Eulalah Rupert, Therapist, sports, BSN    Medication changes reported      No    Fall or balance concerns reported     No    Tobacco Cessation  No Change    Warm-up and Cool-down  Performed as group-led instruction    Resistance Training Performed  Yes    VAD Patient?  No    PAD/SET Patient?  No      Pain Assessment   Currently in Pain?  No/denies          Social History   Tobacco Use  Smoking Status Never Smoker  Smokeless Tobacco Never Used    Goals Met:  Proper associated with RPD/PD & O2 Sat Independence with exercise equipment No report of cardiac concerns or symptoms Strength training completed today  Goals Unmet:  Not Applicable  Comments:  Pt able to follow exercise prescription today without complaint.  Will continue to monitor for progression. Reviewed home exercise with pt today.  Pt plans to continue to go to [facility] for exercise.  He uses the treadmills and machines when he is there.  Reviewed THR, pulse, RPE, sign and symptoms, NTG use, and when to call 911 or MD.  Also discussed weather considerations and indoor options.  Pt voiced understanding.    Dr. Emily Filbert is Medical Director for [place] and [facility].

## 2018-11-13 DIAGNOSIS — Z951 Presence of aortocoronary bypass graft: Secondary | ICD-10-CM | POA: Diagnosis not present

## 2018-11-13 DIAGNOSIS — Z952 Presence of prosthetic heart valve: Secondary | ICD-10-CM

## 2018-11-13 NOTE — Progress Notes (Signed)
Daily Session Note  Patient Details  Name: Daniel Mendez MRN: [id number] Date of Birth: 01-15-1941 Referring Provider:     Cardiac Rehab from 10/26/2018 in [facility] Cardiac and Pulmonary Rehab  Referring Provider  [name]      Encounter Date: 11/13/2018  Check In:      Social History   Tobacco Use  Smoking Status Never Smoker  Smokeless Tobacco Never Used    Goals Met:  Independence with exercise equipment Exercise tolerated well No report of cardiac concerns or symptoms Strength training completed today  Goals Unmet:  Not Applicable  Comments: Pt able to follow exercise prescription today without complaint.  Will continue to monitor for progression.    Dr. Emily Filbert is Medical Director for [place] and [facility].

## 2018-11-15 ENCOUNTER — Other Ambulatory Visit: Payer: Self-pay | Admitting: Cardiothoracic Surgery

## 2018-11-16 ENCOUNTER — Other Ambulatory Visit: Payer: Self-pay | Admitting: *Deleted

## 2018-11-16 DIAGNOSIS — Z951 Presence of aortocoronary bypass graft: Secondary | ICD-10-CM

## 2018-11-16 DIAGNOSIS — Z952 Presence of prosthetic heart valve: Secondary | ICD-10-CM

## 2018-11-16 MED ORDER — ASPIRIN 325 MG PO TBEC: 325.0000 mg | DELAYED_RELEASE_TABLET | Freq: Every day | ORAL | 0 refills | Status: DC

## 2018-11-16 NOTE — Progress Notes (Signed)
Daily Session Note  Patient Details  Name: Daniel Mendez MRN: [id number] Date of Birth: 1941/10/31 Referring Provider:     Cardiac Rehab from 10/26/2018 in [facility] Cardiac and Pulmonary Rehab  Referring Provider  [name]      Encounter Date: 11/16/2018  Check In:      Social History   Tobacco Use  Smoking Status Never Smoker  Smokeless Tobacco Never Used    Goals Met:  Independence with exercise equipment Exercise tolerated well No report of cardiac concerns or symptoms Strength training completed today  Goals Unmet:  Not Applicable  Comments: Pt able to follow exercise prescription today without complaint.  Will continue to monitor for progression.    Dr. Emily Filbert is Medical Director for [place] and [facility].

## 2018-11-17 ENCOUNTER — Encounter: Payer: Medicare Other | Admitting: *Deleted

## 2018-11-17 DIAGNOSIS — Z952 Presence of prosthetic heart valve: Secondary | ICD-10-CM

## 2018-11-17 DIAGNOSIS — Z951 Presence of aortocoronary bypass graft: Secondary | ICD-10-CM | POA: Diagnosis not present

## 2018-11-17 LAB — GLUCOSE, CAPILLARY: Glucose-Capillary: 136 mg/dL — ABNORMAL HIGH (ref 70–99)

## 2018-11-17 NOTE — Progress Notes (Signed)
Daily Session Note  Patient Details  Name: Daniel Mendez MRN: [id number] Date of Birth: Mar 28, 1941 Referring Provider:     Cardiac Rehab from 10/26/2018 in [facility] Cardiac and Pulmonary Rehab  Referring Provider  [name]      Encounter Date: 11/17/2018  Check In: Session Check In - 11/17/18 0807      Check-In   Supervising physician immediately available to respond to emergencies  See telemetry face sheet for immediately available ER MD    Location  [facility]-Cardiac & Pulmonary Rehab    Staff Present  Alberteen Sam, MA, RCEP, CCRP, Exercise Physiologist;Amanda Oletta Darter, BA, ACSM CEP, Exercise Physiologist;Meredith Sherryll Burger, RN BSN    Medication changes reported      No    Fall or balance concerns reported     No    Warm-up and Cool-down  Performed as group-led Higher education careers adviser Performed  Yes    VAD Patient?  No    PAD/SET Patient?  No      Pain Assessment   Currently in Pain?  No/denies          Social History   Tobacco Use  Smoking Status Never Smoker  Smokeless Tobacco Never Used    Goals Met:  Independence with exercise equipment Exercise tolerated well No report of cardiac concerns or symptoms Strength training completed today  Goals Unmet:  Not Applicable  Comments: Pt able to follow exercise prescription today without complaint.  Will continue to monitor for progression.    Dr. Emily Filbert is Medical Director for [place] and [facility].

## 2018-11-20 DIAGNOSIS — Z951 Presence of aortocoronary bypass graft: Secondary | ICD-10-CM

## 2018-11-20 DIAGNOSIS — Z952 Presence of prosthetic heart valve: Secondary | ICD-10-CM

## 2018-11-20 NOTE — Progress Notes (Signed)
Daily Session Note  Patient Details  Name: Daniel Mendez MRN: [id number] Date of Birth: 02/04/1941 Referring Provider:     Cardiac Rehab from 10/26/2018 in [facility] Cardiac and Pulmonary Rehab  Referring Provider  [name]      Encounter Date: 11/20/2018  Check In: Session Check In - 11/20/18 1735      Check-In   Supervising physician immediately available to respond to emergencies  See telemetry face sheet for immediately available ER MD    Location  [facility]-Cardiac & Pulmonary Rehab    Staff Present  Gerlene Burdock, RN, BSN;Jeanna Durrell BS, Exercise Physiologist;Amanda Oletta Darter, BA, ACSM CEP, Exercise Physiologist    Medication changes reported      No    Fall or balance concerns reported     No    Tobacco Cessation  No Change    Warm-up and Cool-down  Performed as group-led instruction    Resistance Training Performed  Yes    VAD Patient?  No    PAD/SET Patient?  No      Pain Assessment   Currently in Pain?  No/denies    Multiple Pain Sites  No          Social History   Tobacco Use  Smoking Status Never Smoker  Smokeless Tobacco Never Used    Goals Met:  Independence with exercise equipment Exercise tolerated well No report of cardiac concerns or symptoms Strength training completed today  Goals Unmet:  Not Applicable  Comments: Pt able to follow exercise prescription today without complaint.  Will continue to monitor for progression.    Dr. Emily Filbert is Medical Director for [place] and [facility].

## 2018-11-21 ENCOUNTER — Encounter: Payer: Self-pay | Admitting: *Deleted

## 2018-11-21 ENCOUNTER — Telehealth: Payer: Self-pay | Admitting: *Deleted

## 2018-11-21 ENCOUNTER — Telehealth: Payer: Self-pay | Admitting: Interventional Cardiology

## 2018-11-21 DIAGNOSIS — Z952 Presence of prosthetic heart valve: Secondary | ICD-10-CM

## 2018-11-21 DIAGNOSIS — Z951 Presence of aortocoronary bypass graft: Secondary | ICD-10-CM

## 2018-11-21 NOTE — Telephone Encounter (Signed)
On the office visit, he should have a CBC, TSH, magnesium, and basic metabolic panel

## 2018-11-21 NOTE — Progress Notes (Signed)
Cardiac Individual Treatment Plan  Patient Details  Name: Daniel Mendez MRN: [id number] Date of Birth: [birth year] Referring Provider:     Cardiac Rehab from 10/26/2018 in [facility] Cardiac and Pulmonary Rehab  Referring Provider  [name]      Initial Encounter Date:    Cardiac Rehab from 10/26/2018 in [facility] Cardiac and Pulmonary Rehab  Date  10/26/18      Visit Diagnosis: S/P CABG x 1  S/P TAVR (transcatheter aortic valve replacement)  Patient's Home Medications on Admission:  Current Outpatient Medications:  .  acetaminophen (TYLENOL) 325 MG tablet, Take 2 tablets (650 mg total) by mouth every 6 (six) hours as needed for mild pain., Disp: , Rfl:  .  amiodarone (PACERONE) 200 MG tablet, Take 1 tablet (200 mg total) by mouth every 12 (twelve) hours. Stay on 200 mg a day - stop on December 15th, 2019, Disp: 30 tablet, Rfl: 1 .  amLODipine (NORVASC) 5 MG tablet, Take 1 tablet (5 mg total) by mouth daily., Disp: 90 tablet, Rfl: 3 .  aspirin 325 MG EC tablet, Take 1 tablet (325 mg total) by mouth daily., Disp: 90 tablet, Rfl: 0 .  atorvastatin (LIPITOR) 20 MG tablet, Take 20 mg by mouth daily at 12 noon. , Disp: , Rfl:  .  cyclobenzaprine (FLEXERIL) 5 MG tablet, Take 2.5 mg by mouth daily as needed for muscle spasms. , Disp: , Rfl:  .  glimepiride (AMARYL) 4 MG tablet, Take 4 mg by mouth daily with breakfast., Disp: , Rfl:  .  hydrochlorothiazide (MICROZIDE) 12.5 MG capsule, Take 1 capsule (12.5 mg total) by mouth daily., Disp: 90 capsule, Rfl: 3 .  metFORMIN (GLUCOPHAGE) 500 MG tablet, Take 1,000 mg by mouth 2 (two) times daily. , Disp: , Rfl:  .  metoprolol succinate (TOPROL-XL) 25 MG 24 hr tablet, Take 12.5 mg by mouth daily., Disp: , Rfl:  .  Omega-3 Fatty Acids (FISH OIL) 1000 MG CAPS, Take 1,000 mg by mouth 2 (two) times daily. , Disp: , Rfl:  .  omeprazole (PRILOSEC) 20 MG capsule, Take 20 mg by mouth every other day. At noon, Disp: , Rfl:  .  ONE TOUCH ULTRA TEST test strip, 1 each by  Other route daily. , Disp: , Rfl:  .  ONETOUCH DELICA LANCETS 40J MISC, 1 each by Other route daily. , Disp: , Rfl:  .  sitaGLIPtin (JANUVIA) 100 MG tablet, Take 100 mg by mouth daily with lunch. , Disp: , Rfl:  .  tamsulosin (FLOMAX) 0.4 MG CAPS capsule, Take 0.4 mg by mouth every evening., Disp: , Rfl:  .  telmisartan (MICARDIS) 40 MG tablet, Take 20 mg by mouth daily., Disp: , Rfl:  .  testosterone cypionate (DEPOTESTOSTERONE CYPIONATE) 200 MG/ML injection, Inject 200 mg into the muscle every 14 (fourteen) days., Disp: , Rfl:  .  traMADol (ULTRAM) 50 MG tablet, Take 50 mg by mouth every 4-6 hours PRN severe pain, Disp: 28 tablet, Rfl: 0  Past Medical History: Past Medical History:  Diagnosis Date  . Anginal pain ([place])   . Arthritis    hand- right  . Coronary artery disease   . Diabetes ([place])    type 2  . GERD (gastroesophageal reflux disease)   . Heart murmur    discovered in 2015 - followed by Dr. Daneen Schick  . History of hiatal hernia   . History of kidney stones   . HTN (hypertension)   . Hypercalcemia   . Hyperglyceridemia   .  Hyperlipidemia   . Mild aortic stenosis Oct 2014  . Peripheral vascular disease ([place])   . Pneumonia 07/28/2018  . Shortness of breath dyspnea    on exertion  . Sleep apnea    no cpap    Tobacco Use: Social History   Tobacco Use  Smoking Status Never Smoker  Smokeless Tobacco Never Used    Labs: Recent Review Flowsheet Data    Labs for ITP Cardiac and Pulmonary Rehab Latest Ref Rng & Units 09/18/2018 09/18/2018 09/18/2018 09/18/2018 09/19/2018   Hemoglobin A1c 4.8 - 5.6 % - - - - -   PHART 7.350 - 7.450 7.358 7.348(L) 7.323(L) - -   PCO2ART 32.0 - 48.0 mmHg 43.6 40.2 45.2 - -   HCO3 20.0 - 28.0 mmol/L 24.8 22.1 23.4 - -   TCO2 22 - 32 mmol/L _0 ACIDBASEDEF 0.0 - 2.0 mmol/L 1.0 3.0(H) 3.0(H) - -   O2SAT % 99.0 96.0 94.0 - -       Exercise Target Goals: Exercise Program Goal: Individual exercise prescription set  using results from initial 6 min walk test and THRR while considering  patient's activity barriers and safety.   Exercise Prescription Goal: Initial exercise prescription builds to 30-45 minutes a day of aerobic activity, 2-3 days per week.  Home exercise guidelines will be given to patient during program as part of exercise prescription that the participant will acknowledge.  Activity Barriers & Risk Stratification: Activity Barriers & Cardiac Risk Stratification - 10/26/18 1424      Activity Barriers & Cardiac Risk Stratification   Activity Barriers  Joint Problems;Shortness of Breath    Cardiac Risk Stratification  Moderate       6 Minute Walk: 6 Minute Walk    Row Name 10/26/18 1438         6 Minute Walk   Phase  Initial     Distance  1342 feet     Walk Time  6 minutes     # of Rest Breaks  0     MPH  2.54     METS  3.2     RPE  11     Perceived Dyspnea   0     VO2 Peak  11.17     Symptoms  No     Resting HR  71 bpm     Resting BP  148/60     Resting Oxygen Saturation   97 %     Max Ex. HR  105 bpm     Max Ex. BP  180/66     2 Minute Post BP  144/68        Oxygen Initial Assessment:   Oxygen Re-Evaluation:   Oxygen Discharge (Final Oxygen Re-Evaluation):   Initial Exercise Prescription: Initial Exercise Prescription - 10/26/18 1400      Date of Initial Exercise RX and Referring Provider   Date  10/26/18    Referring Provider  [name]      Treadmill   MPH  2.5    Grade  0.5    Minutes  15    METs  3.09      Recumbant Bike   Level  3    RPM  60    Watts  32    Minutes  15    METs  3      REL-XR   Level  3    Speed  50    Minutes  15  METs  3      Prescription Details   Frequency (times per week)  3    Duration  Progress to 45 minutes of aerobic exercise without signs/symptoms of physical distress      Intensity   THRR 40-80% of Max Heartrate  99-127    Ratings of Perceived Exertion  11-13    Perceived Dyspnea  0-4      Resistance  Training   Training Prescription  Yes    Weight  3 lb    Reps  10-15       Perform Capillary Blood Glucose checks as needed.  Exercise Prescription Changes: Exercise Prescription Changes    Row Name 10/26/18 1400 11/01/18 1100 11/09/18 0800 11/14/18 1000       Response to Exercise   Blood Pressure (Admit)  148/60  160/70  -  122/80    Blood Pressure (Exercise)  180/66  142/80  -  148/52    Blood Pressure (Exit)  144/68  104/60  -  110/64    Heart Rate (Admit)  64 bpm  62 bpm  -  63 bpm    Heart Rate (Exercise)  105 bpm  106 bpm  -  101 bpm    Heart Rate (Exit)  68 bpm  84 bpm  -  69 bpm    Oxygen Saturation (Admit)  97 %  -  -  -    Rating of Perceived Exertion (Exercise)  11  13  -  12    Symptoms  -  -  -  none    Comments  -  first day  -  -    Duration  -  Progress to 45 minutes of aerobic exercise without signs/symptoms of physical distress  -  Progress to 45 minutes of aerobic exercise without signs/symptoms of physical distress    Intensity  -  THRR unchanged  -  THRR unchanged      Progression   Progression  -  Continue to progress workloads to maintain intensity without signs/symptoms of physical distress.  -  Continue to progress workloads to maintain intensity without signs/symptoms of physical distress.    Average METs  -  2.8  -  -      Resistance Training   Training Prescription  -  Yes  -  Yes    Weight  -  3 lb  -  3 lb    Reps  -  10-15  -  10-15      Interval Training   Interval Training  -  No  -  No      Treadmill   MPH  -  2.2  -  2.5    Grade  -  0.5  -  2    Minutes  -  15  -  15    METs  -  2.84  -  3.6      Recumbant Bike   Level  -  -  -  6    RPM  -  -  -  60    Watts  -  -  -  36    Minutes  -  -  -  15    METs  -  -  -  3.4      REL-XR   Level  -  3  -  -    Speed  -  50  -  -    Minutes  -  15  -  -    METs  -  2.8  -  -      Home Exercise Plan   Plans to continue exercise at  -  -  [employer] (comment) [place] (comment) [place] 2 additional days to program exercise sessions.  Add 2 additional days to program exercise sessions.    Initial Home Exercises Provided  -  -  11/09/18  11/09/18       Exercise Comments: Exercise Comments    Row Name 10/30/18 1722           Exercise Comments  First full day of exercise!  Patient was oriented to gym and equipment including functions, settings, policies, and procedures.  Patient's individual exercise prescription and treatment plan were reviewed.  All starting workloads were established based on the results of the 6 minute walk test done at initial orientation visit.  The plan for exercise progression was also introduced and progression will be customized based on patient's performance and goals.          Exercise Goals and Review: Exercise Goals    Row Name 10/26/18 1438             Exercise Goals   Increase Physical Activity  Yes       Intervention  Provide advice, education, support and counseling about physical activity/exercise needs.;Develop an individualized exercise prescription for aerobic and resistive training based on initial evaluation findings, risk stratification, comorbidities and participant's personal goals.       Expected Outcomes  Short Term: Attend rehab on a regular basis to increase amount of physical activity.;Long Term: Exercising regularly at least 3-5 days a week.;Long Term: Add in home exercise to make exercise part of routine and to increase amount of physical activity.       Increase Strength and Stamina  Yes       Intervention  Provide advice, education, support and counseling about physical activity/exercise needs.;Develop an individualized exercise prescription for aerobic and resistive training based on initial evaluation findings, risk stratification, comorbidities and participant's personal goals.       Expected Outcomes  Short Term: Increase workloads  from initial exercise prescription for resistance, speed, and METs.;Short Term: Perform resistance training exercises routinely during rehab and add in resistance training at home;Long Term: Improve cardiorespiratory fitness, muscular endurance and strength as measured by increased METs and functional capacity (6MWT)       Able to understand and use rate of perceived exertion (RPE) scale  Yes       Intervention  Provide education and explanation on how to use RPE scale       Expected Outcomes  Short Term: Able to use RPE daily in rehab to express subjective intensity level;Long Term:  Able to use RPE to guide intensity level when exercising independently       Knowledge and understanding of Target Heart Rate Range (THRR)  Yes       Intervention  Provide education and explanation of THRR including how the numbers were predicted and where they are located for reference       Expected Outcomes  Short Term: Able to state/look up THRR;Short Term: Able to use daily as guideline for intensity in rehab;Long Term: Able to use THRR to govern intensity when exercising independently       Able to check pulse independently  Yes  Intervention  Provide education and demonstration on how to check pulse in carotid and radial arteries.;Review the importance of being able to check your own pulse for safety during independent exercise       Expected Outcomes  Short Term: Able to explain why pulse checking is important during independent exercise;Long Term: Able to check pulse independently and accurately       Understanding of Exercise Prescription  Yes       Intervention  Provide education, explanation, and written materials on patient's individual exercise prescription       Expected Outcomes  Short Term: Able to explain program exercise prescription;Long Term: Able to explain home exercise prescription to exercise independently          Exercise Goals Re-Evaluation : Exercise Goals Re-Evaluation    Row Name  10/30/18 1722 11/09/18 0834 11/14/18 1015         Exercise Goal Re-Evaluation   Exercise Goals Review  Increase Physical Activity;Increase Strength and Stamina;Able to understand and use rate of perceived exertion (RPE) scale;Knowledge and understanding of Target Heart Rate Range (THRR);Understanding of Exercise Prescription  Increase Physical Activity;Increase Strength and Stamina;Able to understand and use rate of perceived exertion (RPE) scale;Knowledge and understanding of Target Heart Rate Range (THRR);Able to check pulse independently;Understanding of Exercise Prescription  Increase Physical Activity;Increase Strength and Stamina;Able to understand and use rate of perceived exertion (RPE) scale;Knowledge and understanding of Target Heart Rate Range (THRR);Understanding of Exercise Prescription     Comments  Reviewed RPE scale, THRR, and initial exercise prescription today with patient. Patient voiced understanding of these topics and tolerated first day of exercise well.   Reviewed home exercise with pt today.  Pt plans to continue to go to [facility] for exercise.  He uses the treadmills and machines when he is there.  Reviewed THR, pulse, RPE, sign and symptoms, NTG use, and when to call 911 or MD.  Also discussed weather considerations and indoor options.  Pt voiced understanding.  Casandra Doffing attends consistently and works in correct THR and RPE range.     Expected Outcomes  Short: use RPE daily to determine activity intensity. Long: Understand and follow exercise presciption.   Short: Continue to go to [facility] for exercise.  Long: Continue to increase activity levels.   Short - continue to attend consistently Long - increase MET level        Discharge Exercise Prescription (Final Exercise Prescription Changes): Exercise Prescription Changes - 11/14/18 1000      Response to Exercise   Blood Pressure (Admit)  122/80    Blood Pressure (Exercise)  148/52    Blood Pressure (Exit)   110/64    Heart Rate (Admit)  63 bpm    Heart Rate (Exercise)  101 bpm    Heart Rate (Exit)  69 bpm    Rating of Perceived Exertion (Exercise)  12    Symptoms  none    Duration  Progress to 45 minutes of aerobic exercise without signs/symptoms of physical distress    Intensity  THRR unchanged      Progression   Progression  Continue to progress workloads to maintain intensity without signs/symptoms of physical distress.      Resistance Training   Training Prescription  Yes    Weight  3 lb    Reps  10-15      Interval Training   Interval Training  No      Treadmill   MPH  2.5  Grade  2    Minutes  15    METs  3.6      Recumbant Bike   Level  6    RPM  60    Watts  36    Minutes  15    METs  3.4      Home Exercise Plan   Plans to continue exercise at  [employer] [facility] [employer] (comment)   [facility]   Frequency  Add 2 additional days to program exercise sessions.    Initial Home Exercises Provided  11/09/18       Nutrition:  Target Goals: Understanding of nutrition guidelines, daily intake of sodium <154m, cholesterol <2056m calories 30% from fat and 7% or less from saturated fats, daily to have 5 or more servings of fruits and vegetables.  Biometrics: Pre Biometrics - 10/26/18 1437      Pre Biometrics   Height  5' 10.5" (1.791 m)    Weight  178 lb 9.6 oz (81 kg)    Waist Circumference  37 inches    Hip Circumference  40 inches    Waist to Hip Ratio  0.92 %    BMI (Calculated)  25.26    Single Leg Stand  28 seconds        Nutrition Therapy Plan and Nutrition Goals: Nutrition Therapy & Goals - 11/20/18 1704      Nutrition Therapy   Diet  DM    Drug/Food Interactions  Statins/Certain Fruits    Protein (specify units)  11oz    Fiber  35 grams    Whole Grain Foods  3 servings   eats oatmeal regularly and bread is often whole grain   Saturated Fats  14 max. grams    Fruits and Vegetables  5 servings/day   8 ideal   Sodium  1500 grams       Personal Nutrition Goals   Nutrition Goal  Drink at least one additional glass of fluids per day, ideally unsweetened    Personal Goal #2  Now that you have a new BG meter, experiment with type/frequency of HS snacks and BG readings upon waking in the morning. Try to identify patterns that may exist    Comments  S/p surgery pt reports BG readings to be abnormal and K+ to be elevated. He was given a new BG meter and is starting to see improvements in BG readings and has gone from 3 bananas / day to 0-1 / day to decrease K+ intake. HgbA1c 7.1 two weeks ago per his report, improved from previous reading. Breakfast: oatmeal with walnuts and fruit, Lunch: soup with vegetables and chipped beef, sandwich, Dinner: baked potato, green beans, broccoli, chicken, fish, seafood, coleslaw, spaghetti and meatballs & other [place]. If they eat out he tries to choose at least one vegetable as a side. He and his wife cook using olive oil and do not deep fry foods. Snacks: fruit, low sugar ice cream with nuts/ fudge/ amaretto occasionally, SF candy. Beverages: flavored water, diet soda, some water. He eats canned vegetables mostly in the winter but does rinse them off with water before eating.       Intervention Plan   Intervention  Prescribe, educate and counsel regarding individualized specific dietary modifications aiming towards targeted core components such as weight, hypertension, lipid management, diabetes, heart failure and other comorbidities.    Expected Outcomes  Short Term Goal: Understand basic principles of dietary content, such as calories, fat, sodium, cholesterol and nutrients.;Long Term  Goal: Adherence to prescribed nutrition plan.;Short Term Goal: A plan has been developed with personal nutrition goals set during dietitian appointment.       Nutrition Assessments: Nutrition Assessments - 10/26/18 1355      MEDFICTS Scores   Pre Score  43       Nutrition Goals Re-Evaluation: Nutrition Goals  Re-Evaluation    [place] Name 11/20/18 1713             Goals   Nutrition Goal  Drink at least one additional glass of fluids per day, ideally unsweetened       Comment  Pt does not think that he drinks adequate fluids daily and today in class was having some irregular heart beats during exercise. He does not drink much water; prefers flavored water or diet soda. He sometimes keeps a bottle of water in his vehicle       Expected Outcome  He will drink at least one additional 8oz glass of fluids (ideally water) each day. Keep a water bottle handy IE in the car         Personal Goal #2 Re-Evaluation   Personal Goal #2  Now that you have a new BG meter, experiment with type/frequency of HS snacks and BG readings upon waking in the morning. Try to identify patterns that may exist          Nutrition Goals Discharge (Final Nutrition Goals Re-Evaluation): Nutrition Goals Re-Evaluation - 11/20/18 1713      Goals   Nutrition Goal  Drink at least one additional glass of fluids per day, ideally unsweetened    Comment  Pt does not think that he drinks adequate fluids daily and today in class was having some irregular heart beats during exercise. He does not drink much water; prefers flavored water or diet soda. He sometimes keeps a bottle of water in his vehicle    Expected Outcome  He will drink at least one additional 8oz glass of fluids (ideally water) each day. Keep a water bottle handy IE in the car      Personal Goal #2 Re-Evaluation   Personal Goal #2  Now that you have a new BG meter, experiment with type/frequency of HS snacks and BG readings upon waking in the morning. Try to identify patterns that may exist       Psychosocial: Target Goals: Acknowledge presence or absence of significant depression and/or stress, maximize coping skills, provide positive support system. Participant is able to verbalize types and ability to use techniques and skills needed for reducing stress and depression.    Initial Review & Psychosocial Screening: Initial Psych Review & Screening - 10/26/18 1347      Initial Review   Current issues with  Current Stress Concerns    Source of Stress Concerns  Unable to perform yard/household activities    Comments  Leland's biggest stressor currently is trying to get his blood sugar controlled. He and his wife think it is a medication issue and are trying to figure it out. 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       Family Dynamics   Good Support System?  Yes   wife      Barriers   Psychosocial barriers to participate in program  There are no identifiable barriers or psychosocial needs.;The patient should benefit from training in stress management and relaxation.      Screening Interventions  Interventions  Encouraged to exercise;Program counselor consult;Provide feedback about the scores to participant;To provide support and resources with identified psychosocial needs    Expected Outcomes  Short Term goal: Utilizing psychosocial counselor, staff and physician to assist with identification of specific Stressors or current issues interfering with healing process. Setting desired goal for each stressor or current issue identified.;Long Term Goal: Stressors or current issues are controlled or eliminated.;Short Term goal: Identification and review with participant of any Quality of Life or Depression concerns found by scoring the questionnaire.;Long Term goal: The participant improves quality of Life and PHQ9 Scores as seen by post scores and/or verbalization of changes       Quality of Life Scores:  Quality of Life - 10/26/18 1350      Quality of Life   Select  Quality of Life      Quality of Life Scores   Health/Function Pre  26 %    Socioeconomic Pre  28.43 %    Psych/Spiritual Pre  30 %    Family Pre  27.6 %    GLOBAL Pre  27.56 %      Scores of 19 and below usually indicate  a poorer quality of life in these areas.  A difference of  2-3 points is a clinically meaningful difference.  A difference of 2-3 points in the total score of the Quality of Life Index has been associated with significant improvement in overall quality of life, self-image, physical symptoms, and general health in studies assessing change in quality of life.  PHQ-9: Recent Review Flowsheet Data    Depression screen [facility] 2/9 10/26/2018 03/30/2016 11/05/2015   Decreased Interest 0 0 0   Down, Depressed, Hopeless 0 0 0   PHQ - 2 Score 0 0 0   Altered sleeping 0 - -   Tired, decreased energy 1 - -   Change in appetite 0 - -   Feeling bad or failure about yourself  0 - -   Trouble concentrating 0 - -   Moving slowly or fidgety/restless 0 - -   Suicidal thoughts 0 - -   PHQ-9 Score 1 - -   Difficult doing work/chores Not difficult at all - -     Interpretation of Total Score  Total Score Depression Severity:  1-4 = Minimal depression, 5-9 = Mild depression, 10-14 = Moderate depression, 15-19 = Moderately severe depression, 20-27 = Severe depression   Psychosocial Evaluation and Intervention: Psychosocial Evaluation - 11/06/18 1719      Psychosocial Evaluation & Interventions   Interventions  Stress management education;Encouraged to exercise with the program and follow exercise prescription;Relaxation education    Comments  Counselor met with Mr. Mcnay Casandra Doffing) today for initial psychosocial evaluation.  He is a 77 year old who had valve replacement and a CABGx1 on October 28th.  He has a strong support system with a spouse of 93 years; a daughter locally and a son in [place].  Casandra Doffing reports having diabetes and low testosterone on top of his cardiac condition.  He sleeps well and has a good appetite.  He denies a history of depression or anxiety or any current symptoms.  Casandra Doffing states he is typically in a positive mood most of the time.  He has multiple stressors currently with a daughter  who has battling with breast/ovarian cancer and having a mastectomy  later this week.  He is also concerned for his own health and that of his spouse who is a fall risk  with multiple orthopaedic health issues.  Casandra Doffing has goals to increase his energy; stamina and strength while in this program and to breathe better.  Staff will follow with him.     Expected Outcomes  Short:  Casandra Doffing will exercise for his health and to reduce his stress with all that is going on in his life currently.  Long:  Casandra Doffing will develop a routine of stress management practices - including exercise - for his health and his mental health.     Continue Psychosocial Services   Follow up required by staff       Psychosocial Re-Evaluation:   Psychosocial Discharge (Final Psychosocial Re-Evaluation):   Vocational Rehabilitation: Provide vocational rehab assistance to qualifying candidates.   Vocational Rehab Evaluation & Intervention: Vocational Rehab - 10/26/18 1347      Initial Vocational Rehab Evaluation & Intervention   Assessment shows need for Vocational Rehabilitation  No       Education: Education Goals: Education classes will be provided on a variety of topics geared toward better understanding of heart health and risk factor modification. Participant will state understanding/return demonstration of topics presented as noted by education test scores.  Learning Barriers/Preferences: Learning Barriers/Preferences - 10/26/18 1342      Learning Barriers/Preferences   Learning Barriers  None    Learning Preferences  None       Education Topics:  AED/CPR: - Group verbal and written instruction with the use of models to demonstrate the basic use of the AED with the basic ABC's of resuscitation.   General Nutrition Guidelines/Fats and Fiber: -Group instruction provided by verbal, written material, models and posters to present the general guidelines for heart healthy nutrition. Gives an explanation and  review of dietary fats and fiber.   Controlling Sodium/Reading Food Labels: -Group verbal and written material supporting the discussion of sodium use in heart healthy nutrition. Review and explanation with models, verbal and written materials for utilization of the food label.   Exercise Physiology & General Exercise Guidelines: - Group verbal and written instruction with models to review the exercise physiology of the cardiovascular system and associated critical values. Provides general exercise guidelines with specific guidelines to those with heart or lung disease.    Aerobic Exercise & Resistance Training: - Gives group verbal and written instruction on the various components of exercise. Focuses on aerobic and resistive training programs and the benefits of this training and how to safely progress through these programs..   Flexibility, Balance, Mind/Body Relaxation: Provides group verbal/written instruction on the benefits of flexibility and balance training, including mind/body exercise modes such as yoga, pilates and tai chi.  Demonstration and skill practice provided.   Stress and Anxiety: - Provides group verbal and written instruction about the health risks of elevated stress and causes of high stress.  Discuss the correlation between heart/lung disease and anxiety and treatment options. Review healthy ways to manage with stress and anxiety.   Depression: - Provides group verbal and written instruction on the correlation between heart/lung disease and depressed mood, treatment options, and the stigmas associated with seeking treatment.   Cardiac Rehab from 11/20/2018 in [facility] Cardiac and Pulmonary Rehab  Date  11/01/18  Educator  Lucianne Lei, MSW  Instruction Review Code  2- Demonstrated Understanding      Anatomy & Physiology of the Heart: - Group verbal and written instruction and models provide basic cardiac anatomy and physiology, with the coronary electrical and  arterial systems. Review of Valvular disease and Heart Failure  Cardiac Rehab from 11/20/2018 in [facility] Cardiac and Pulmonary Rehab  Date  10/30/18  Educator  CE  Instruction Review Code  1- Verbalizes Understanding      Cardiac Procedures: - Group verbal and written instruction to review commonly prescribed medications for heart disease. Reviews the medication, class of the drug, and side effects. Includes the steps to properly store meds and maintain the prescription regimen. (beta blockers and nitrates)   Cardiac Medications I: - Group verbal and written instruction to review commonly prescribed medications for heart disease. Reviews the medication, class of the drug, and side effects. Includes the steps to properly store meds and maintain the prescription regimen.   Cardiac Rehab from 11/20/2018 in [facility] Cardiac and Pulmonary Rehab  Date  11/06/18  Educator  CE  Instruction Review Code  1- Verbalizes Understanding      Cardiac Medications II: -Group verbal and written instruction to review commonly prescribed medications for heart disease. Reviews the medication, class of the drug, and side effects. (all other drug classes)    Go Sex-Intimacy & Heart Disease, Get SMART - Goal Setting: - Group verbal and written instruction through game format to discuss heart disease and the return to sexual intimacy. Provides group verbal and written material to discuss and apply goal setting through the application of the S.M.A.R.T. Method.   Other Matters of the Heart: - Provides group verbal, written materials and models to describe Stable Angina and Peripheral Artery. Includes description of the disease process and treatment options available to the cardiac patient.   Cardiac Rehab from 11/20/2018 in [facility] Cardiac and Pulmonary Rehab  Date  10/30/18  Educator  CE  Instruction Review Code  1- Verbalizes Understanding      Exercise & Equipment Safety: - Individual verbal instruction and  demonstration of equipment use and safety with use of the equipment.   Cardiac Rehab from 11/20/2018 in [facility] Cardiac and Pulmonary Rehab  Date  10/26/18  Educator  [facility]  Instruction Review Code  1- Verbalizes Understanding      Infection Prevention: - Provides verbal and written material to individual with discussion of infection control including proper hand washing and proper equipment cleaning during exercise session.   Cardiac Rehab from 11/20/2018 in [facility] Cardiac and Pulmonary Rehab  Date  10/26/18  Educator  [facility]  Instruction Review Code  1- Verbalizes Understanding      Falls Prevention: - Provides verbal and written material to individual with discussion of falls prevention and safety.   Cardiac Rehab from 11/20/2018 in [facility] Cardiac and Pulmonary Rehab  Date  10/26/18  Educator  [facility]  Instruction Review Code  1- Verbalizes Understanding      Diabetes: - Individual verbal and written instruction to review signs/symptoms of diabetes, desired ranges of glucose level fasting, after meals and with exercise. Acknowledge that pre and post exercise glucose checks will be done for 3 sessions at entry of program.   Cardiac Rehab from 11/20/2018 in [facility] Cardiac and Pulmonary Rehab  Date  10/26/18  Educator  [facility]  Instruction Review Code  1- Verbalizes Understanding      Know Your Numbers and Risk Factors: -Group verbal and written instruction about important numbers in your health.  Discussion of what are risk factors and how they play a role in the disease process.  Review of Cholesterol, Blood Pressure, Diabetes, and BMI and the role they play in your overall health.   Sleep Hygiene: -Provides group verbal and written instruction about how sleep can  affect your health.  Define sleep hygiene, discuss sleep cycles and impact of sleep habits. Review good sleep hygiene tips.    Other: -Provides group and verbal instruction on various topics (see comments)   Knowledge Questionnaire  Score: Knowledge Questionnaire Score - 10/26/18 1342      Knowledge Questionnaire Score   Pre Score  22/26   correct answers reviewed with Casandra Doffing. Focus on exercise      Core Components/Risk Factors/Patient Goals at Admission: Personal Goals and Risk Factors at Admission - 10/26/18 1341      Core Components/Risk Factors/Patient Goals on Admission    Weight Management  Yes;Weight Maintenance    Intervention  Weight Management: Develop a combined nutrition and exercise program designed to reach desired caloric intake, while maintaining appropriate intake of nutrient and fiber, sodium and fats, and appropriate energy expenditure required for the weight goal.;Weight Management: Provide education and appropriate resources to help participant work on and attain dietary goals.    Expected Outcomes  Short Term: Continue to assess and modify interventions until short term weight is achieved;Long Term: Adherence to nutrition and physical activity/exercise program aimed toward attainment of established weight goal;Weight Maintenance: Understanding of the daily nutrition guidelines, which includes 25-35% calories from fat, 7% or less cal from saturated fats, less than '200mg'$  cholesterol, less than 1.5gm of sodium, & 5 or more servings of fruits and vegetables daily;Understanding recommendations for meals to include 15-35% energy as protein, 25-35% energy from fat, 35-60% energy from carbohydrates, less than '200mg'$  of dietary cholesterol, 20-35 gm of total fiber daily;Understanding of distribution of calorie intake throughout the day with the consumption of 4-5 meals/snacks    Diabetes  Yes    Intervention  Provide education about signs/symptoms and action to take for hypo/hyperglycemia.;Provide education about proper nutrition, including hydration, and aerobic/resistive exercise prescription along with prescribed medications to achieve blood glucose in normal ranges: Fasting glucose 65-99 mg/dL    Expected  Outcomes  Short Term: Participant verbalizes understanding of the signs/symptoms and immediate care of hyper/hypoglycemia, proper foot care and importance of medication, aerobic/resistive exercise and nutrition plan for blood glucose control.;Long Term: Attainment of HbA1C < 7%.    Hypertension  Yes    Intervention  Provide education on lifestyle modifcations including regular physical activity/exercise, weight management, moderate sodium restriction and increased consumption of fresh fruit, vegetables, and low fat dairy, alcohol moderation, and smoking cessation.;Monitor prescription use compliance.    Expected Outcomes  Short Term: Continued assessment and intervention until BP is < 140/93m HG in hypertensive participants. < 130/850mHG in hypertensive participants with diabetes, heart failure or chronic kidney disease.;Long Term: Maintenance of blood pressure at goal levels.    Lipids  Yes    Intervention  Provide education and support for participant on nutrition & aerobic/resistive exercise along with prescribed medications to achieve LDL '70mg'$ , HDL >'40mg'$ .    Expected Outcomes  Short Term: Participant states understanding of desired cholesterol values and is compliant with medications prescribed. Participant is following exercise prescription and nutrition guidelines.;Long Term: Cholesterol controlled with medications as prescribed, with individualized exercise RX and with personalized nutrition plan. Value goals: LDL < '70mg'$ , HDL > 40 mg.       Core Components/Risk Factors/Patient Goals Review:    Core Components/Risk Factors/Patient Goals at Discharge (Final Review):    ITP Comments: ITP Comments    Row Name 10/26/18 1337 11/21/18 0649         ITP Comments  Med Review completed. Initial ITP created. Diagnosis can  be found in HCL 10/28  30 Day Review. Continue with ITP unless directed changes per Medical Director review. New to program         Comments:

## 2018-11-21 NOTE — Telephone Encounter (Signed)
New Message:      Pt called and said he had an episode irregular heart rates yesterday. Cardiac Rehab was supposed to have sent this over. Cardiac Rehab told him to call the office today and talk to Dr Thompson Caul nurse.

## 2018-11-21 NOTE — Telephone Encounter (Signed)
I left Daniel Mendez a vm on his cell that I called Dr. Thompson Caul office at [phone] to find out another fax number and I faxed the PVC's just now to [phone]. Last evening I have faxed the PVC's to [phone].Daniel Mendez had multiple PVCs in Cardiac Rehab 4pm on 11/20/2018 and Daniel Mendez said he had felt tired the past few days.

## 2018-11-21 NOTE — Telephone Encounter (Signed)
Received paperwork from [facility].  While exercising yesterday pt was having PVCs every few beats.  Spoke with pt and he states that he has been extremely fatigued since Friday but he had a very busy weekend that included having to go to [facility] for a funeral.  Denies feelings of palps/skipped beats.  Pt currently takes Metoprolol Succinate 12.5mg  QD.  Pt has an appt 1/6 with Dr. [name].  Advised pt I would send message to Dr. [name] for review and would call if further recommendations, otherwise keep appt for Monday because he may want to see pt before changing anything.  Pt appreciative for call.

## 2018-11-23 ENCOUNTER — Encounter: Payer: Medicare Other | Attending: Interventional Cardiology

## 2018-11-23 DIAGNOSIS — Z952 Presence of prosthetic heart valve: Secondary | ICD-10-CM | POA: Diagnosis not present

## 2018-11-23 DIAGNOSIS — I10 Essential (primary) hypertension: Secondary | ICD-10-CM | POA: Insufficient documentation

## 2018-11-23 DIAGNOSIS — E785 Hyperlipidemia, unspecified: Secondary | ICD-10-CM | POA: Insufficient documentation

## 2018-11-23 DIAGNOSIS — Z79899 Other long term (current) drug therapy: Secondary | ICD-10-CM | POA: Insufficient documentation

## 2018-11-23 DIAGNOSIS — E119 Type 2 diabetes mellitus without complications: Secondary | ICD-10-CM | POA: Diagnosis not present

## 2018-11-23 DIAGNOSIS — Z7982 Long term (current) use of aspirin: Secondary | ICD-10-CM | POA: Diagnosis not present

## 2018-11-23 DIAGNOSIS — Z7984 Long term (current) use of oral hypoglycemic drugs: Secondary | ICD-10-CM | POA: Insufficient documentation

## 2018-11-23 DIAGNOSIS — I739 Peripheral vascular disease, unspecified: Secondary | ICD-10-CM | POA: Insufficient documentation

## 2018-11-23 DIAGNOSIS — I2510 Atherosclerotic heart disease of native coronary artery without angina pectoris: Secondary | ICD-10-CM | POA: Insufficient documentation

## 2018-11-23 DIAGNOSIS — Z951 Presence of aortocoronary bypass graft: Secondary | ICD-10-CM | POA: Insufficient documentation

## 2018-11-23 DIAGNOSIS — G4730 Sleep apnea, unspecified: Secondary | ICD-10-CM | POA: Insufficient documentation

## 2018-11-23 DIAGNOSIS — E781 Pure hyperglyceridemia: Secondary | ICD-10-CM | POA: Diagnosis not present

## 2018-11-23 DIAGNOSIS — K219 Gastro-esophageal reflux disease without esophagitis: Secondary | ICD-10-CM | POA: Insufficient documentation

## 2018-11-23 NOTE — Progress Notes (Signed)
Daily Session Note  Patient Details  Name: MUBASHIR MALLEK MRN: [id number] Date of Birth: 09/23/1941 Referring Provider:     Cardiac Rehab from 10/26/2018 in [facility] Cardiac and Pulmonary Rehab  Referring Provider  [name]      Encounter Date: 11/23/2018  Check In: Session Check In - 11/23/18 1615      Check-In   Supervising physician immediately available to respond to emergencies  See telemetry face sheet for immediately available ER MD    Location  [facility]-Cardiac & Pulmonary Rehab    Staff Present  Jasper Loser BS, Exercise Physiologist;Meredith Sherryll Burger, RN BSN;Joseph Tessie Fass RCP,RRT,BSRT    Medication changes reported      No    Fall or balance concerns reported     No    Warm-up and Cool-down  Performed as group-led instruction    Resistance Training Performed  Yes    VAD Patient?  No    PAD/SET Patient?  No      Pain Assessment   Currently in Pain?  No/denies          Social History   Tobacco Use  Smoking Status Never Smoker  Smokeless Tobacco Never Used    Goals Met:  Independence with exercise equipment Exercise tolerated well No report of cardiac concerns or symptoms Strength training completed today  Goals Unmet:  Not Applicable  Comments: Pt able to follow exercise prescription today without complaint.  Will continue to monitor for progression.    Dr. Emily Filbert is Medical Director for [place] and [facility].

## 2018-11-26 NOTE — Progress Notes (Signed)
Cardiology Office Note:    Date:  11/27/2018   ID:  Daniel Mendez, DOB 1941-11-09, MRN [id number]  PCP:  Seward Carol, MD  Cardiologist:  No primary care provider on file.   Referring MD: Seward Carol, MD   Chief Complaint  Patient presents with  . Congestive Heart Failure  . Coronary Artery Disease    History of Present Illness:    Daniel Mendez is a 78 y.o. male with a hx of for recent left carotid endarterectomy, moderate aortic stenosis, CAD with multivessel CABG October 2019 and surgical aortic valve replacement #25 bioprosthetic, essential hypertension, and hyperlipidemia and recently noted to have hypercalcemia.  In cardiac rehab been noted to have PVCs.  Feels tired and short of breath climbing stairs.  This is improved compared to presurgery.  Having lightheadedness when he stands up.  In reviewing blood pressures the systolics have been higher than 140 mmHg pretty consistently.  He is not having angina.  Cardiac rehab telemetry demonstrated frequent PVCs on the last rehab visit.  These were mildly symptomatic.  He is not having tachycardia.  He has not had syncope.  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  It was hot and he felt there could have been a component of dehydration.   Past Medical History:  Diagnosis Date  . Anginal pain ([place])   . Arthritis    hand- right  . Coronary artery disease   . Diabetes ([place])    type 2  . GERD (gastroesophageal reflux disease)   . Heart murmur    discovered in 2015 - followed by Dr. Daneen Schick  . History of hiatal hernia   . History of kidney stones   . HTN (hypertension)   . Hypercalcemia   . Hyperglyceridemia   . Hyperlipidemia   . Mild aortic stenosis Oct 2014  . Peripheral vascular disease ([place])   . Pneumonia 07/28/2018  . Shortness of breath dyspnea    on exertion  . Sleep apnea    no cpap    Past Surgical History:    Procedure Laterality Date  . AORTIC VALVE REPLACEMENT N/A 09/18/2018   Procedure: AORTIC VALVE REPLACEMENT (AVR) using inspiris valve - size 25;  Surgeon: Gaye Pollack, MD;  [facility];  Service: Open Heart Surgery;  Laterality: N/A;  . CATARACT EXTRACTION Bilateral   . COLONOSCOPY WITH PROPOFOL N/A 11/19/2014   Procedure: COLONOSCOPY WITH PROPOFOL;  Surgeon: Garlan Fair, MD;  Location: [facility];  Service: Endoscopy;  Laterality: N/A;  . CORONARY ARTERY BYPASS GRAFT N/A 09/18/2018   Procedure: CORONARY ARTERY BYPASS GRAFTING (CABG) X 1 using right greater saphenous vein harvested endoscopically.;  Surgeon: Gaye Pollack, MD;  Location: [facility];  Service: Open Heart Surgery;  Laterality: N/A;  . dental implants     dental post implanted to hold dentures/partials in place  . ENDARTERECTOMY Left 03/10/2015   Procedure: Left Carotid ENDARTERECTOMY ;  Surgeon: Rosetta Posner, MD;  Location: [facility];  Service: Vascular;  Laterality: Left;  . FRACTURE SURGERY Left 1958   Left hand   . HERNIA REPAIR     Bilateral inguinal   . hernia repair b/l inguinal Bilateral   . knee arthroscopy left Left   . RIGHT/LEFT HEART CATH AND CORONARY ANGIOGRAPHY N/A 08/12/2017   Procedure: RIGHT/LEFT HEART CATH AND CORONARY ANGIOGRAPHY;  Surgeon: Belva Crome, MD;  Location: [facility] [facility];  Service: Cardiovascular;  Laterality: N/A;  . RIGHT/LEFT HEART CATH AND CORONARY ANGIOGRAPHY N/A 09/01/2018   Procedure: RIGHT/LEFT HEART CATH AND CORONARY ANGIOGRAPHY;  Surgeon: Belva Crome, MD;  Location: [facility];  Service: Cardiovascular;  Laterality: N/A;  . ROTATOR CUFF REPAIR    . TEE WITHOUT CARDIOVERSION N/A 09/18/2018   Procedure: TRANSESOPHAGEAL ECHOCARDIOGRAM (TEE);  Surgeon: Gaye Pollack, MD;  Location: [facility];  Service: Open Heart Surgery;  Laterality: N/A;  . VASECTOMY      Current Medications: Current Meds  Medication Sig  . acetaminophen (TYLENOL) 325 MG tablet Take 2 tablets  (650 mg total) by mouth every 6 (six) hours as needed for mild pain.  [name] Kitchen amLODipine (NORVASC) 5 MG tablet Take 1 tablet (5 mg total) by mouth daily.  [name] Kitchen aspirin 325 MG EC tablet Take 1 tablet (325 mg total) by mouth daily.  [name] Kitchen atorvastatin (LIPITOR) 20 MG tablet Take 20 mg by mouth daily at 12 noon.   . cyclobenzaprine (FLEXERIL) 5 MG tablet Take 2.5 mg by mouth daily as needed for muscle spasms.   [name] Kitchen glimepiride (AMARYL) 4 MG tablet Take 4 mg by mouth daily with breakfast.  . hydrochlorothiazide (MICROZIDE) 12.5 MG capsule Take 1 capsule (12.5 mg total) by mouth daily.  . metFORMIN (GLUCOPHAGE) 500 MG tablet Take 1,000 mg by mouth 2 (two) times daily.   . metoprolol succinate (TOPROL-XL) 25 MG 24 hr tablet Take 12.5 mg by mouth daily.  . Omega-3 Fatty Acids (FISH OIL) 1000 MG CAPS Take 1,000 mg by mouth 2 (two) times daily.   [name] Kitchen omeprazole (PRILOSEC) 20 MG capsule Take 20 mg by mouth every other day. At noon  . ONE TOUCH ULTRA TEST test strip 1 each by Other route daily.   Glory Rosebush DELICA LANCETS 62Z MISC 1 each by Other route daily.   . sitaGLIPtin (JANUVIA) 100 MG tablet Take 100 mg by mouth daily with lunch.   . tamsulosin (FLOMAX) 0.4 MG CAPS capsule Take 0.4 mg by mouth every evening.  [name] Kitchen telmisartan (MICARDIS) 40 MG tablet Take 20 mg by mouth daily.  [name] Kitchen testosterone cypionate (DEPOTESTOSTERONE CYPIONATE) 200 MG/ML injection Inject 200 mg into the muscle every 14 (fourteen) days.  . traMADol (ULTRAM) 50 MG tablet Take 50 mg by mouth every 4-6 hours PRN severe pain  . [DISCONTINUED] amLODipine (NORVASC) 5 MG tablet Take 2.5 mg by mouth daily.     Allergies:   Patient has no known allergies.   Social History   Socioeconomic History  . Marital status: Married    Spouse name: Not on file  . Number of children: Not on file  . Years of education: Not on file  . Highest education level: Not on file  Occupational History  . Not on file  Social Needs  . Financial resource strain: Not on  file  . Food insecurity:    Worry: Not on file    Inability: Not on file  . Transportation needs:    Medical: Not on file    Non-medical: Not on file  Tobacco Use  . Smoking status: Never Smoker  . Smokeless tobacco: Never Used  Substance and Sexual Activity  . Alcohol use: Yes    Alcohol/week: 0.0 standard drinks    Comment: rarely  . Drug use: No  . Sexual activity: Yes    Birth control/protection: None  Lifestyle  . Physical activity:    Days per week: Not on file    Minutes per session:  Not on file  . Stress: Not on file  Relationships  . Social connections:    Talks on phone: Not on file    Gets together: Not on file    Attends religious service: Not on file    Active member of club or organization: Not on file    Attends meetings of clubs or organizations: Not on file    Relationship status: Not on file  Other Topics Concern  . Not on file  Social History Narrative  . Not on file     Family History: The patient's family history includes CVA in his mother; Cancer in his daughter; Diabetes in his brother; Heart disease in his brother and father; Peripheral vascular disease in his mother.  ROS:   Please see the history of present illness.    Some shortness of breath when climbing stairs.  Blood sugars have been high.  Dizzy when he goes from sitting or bending to standing.  All other systems reviewed and are negative.  EKGs/Labs/Other Studies Reviewed:    The following studies were reviewed today:  Telemetry tracing from [facility] cardiac rehab reveal frequent PVCs.   EKG:  EKG is normal sinus rhythm with nonspecific ST-T abnormality.  Recent Labs: 08/29/2018: NT-Pro BNP 156 09/20/2018: ALT 20; TSH 1.507 09/21/2018: Magnesium 1.7 10/11/2018: Hemoglobin 10.5; Platelets 333 10/17/2018: BUN 19; Creatinine, Ser 1.42; Potassium 5.2; Sodium 139  Recent Lipid Panel No results found for: CHOL, TRIG, HDL, CHOLHDL, VLDL, LDLCALC, LDLDIRECT  Physical Exam:    VS:  BP  (!) 142/68   Pulse 66   Ht 5\' 10"  (1.778 m)   Wt 179 lb 6.4 oz (81.4 kg)   SpO2 94%   BMI 25.74 kg/m     Wt Readings from Last 3 Encounters:  11/27/18 179 lb 6.4 oz (81.4 kg)  10/30/18 179 lb (81.2 kg)  10/26/18 178 lb 9.6 oz (81 kg)     GEN: Healthy appearing. No acute distress HEENT: Normal NECK: No JVD. LYMPHATICS: No lymphadenopathy CARDIAC: RRR.  1/6 to 2/6 systolic murmur without gallop or edema VASCULAR: Pulses bilateral radial 2+, Bruits absent in the carotid region RESPIRATORY:  Clear to auscultation without rales, wheezing or rhonchi  ABDOMEN: Soft, non-tender, non-distended, No pulsatile mass, MUSCULOSKELETAL: No deformity  SKIN: Warm and dry NEUROLOGIC:  Alert and oriented x 3 PSYCHIATRIC:  Normal affect   ASSESSMENT:    1. S/P aortic valve replacement with tissue   2. Coronary artery disease involving native coronary artery of native heart with angina pectoris ([place])   3. Dyspnea on exertion   4. Premature ventricular contraction   5. Essential hypertension, uncontrolled   6. Palpitations   7. Type 2 diabetes mellitus with complication, without long-term current use of insulin (HCC)    PLAN:    In order of problems listed above:  1. Clinically normal auscultation.  Faint 1/6 systolic murmur.  No diastolic murmur is heard. 2. No anginal complaints.  He is less than 6 months post coronary bypass surgery. 3. Improved compared compared to presurgery although feels he is more short of breath than he thought he would be after valve replacement and coronary bypass.  Dyspnea could be blood pressure related.  Could also be valve/patient mismatch with relative aortic stenosis.  Will follow closely. 4. Isolated PVCs and trigeminal pattern were noted at rehab.  Electrolytes including calcium, potassium, and magnesium will be obtained today. 5. Increase amlodipine to 5 mg/day.  Taking 2-1/2 mg currently.  Blood pressure today  is higher than acceptable being greater than  518 mmHg systolic.  We recently started low-dose diuretic therapy as well.  Continue to monitor blood pressure and rehab.  Follow closely.  Clinical follow-up in 3 month.   Medication Adjustments/Labs and Tests Ordered: Current medicines are reviewed at length with the patient today.  Concerns regarding medicines are outlined above.  Orders Placed This Encounter  Procedures  . Basic metabolic panel  . CBC  . TSH  . Magnesium  . EKG 12-Lead   Meds ordered this encounter  Medications  . amLODipine (NORVASC) 5 MG tablet    Sig: Take 1 tablet (5 mg total) by mouth daily.    Dispense:  90 tablet    Refill:  3    Dose change    Patient Instructions  Medication Instructions:  1) INCREASE Amlodipine to 5mg  once daily If you need a refill on your cardiac medications before your next appointment, please call your pharmacy.   Lab work: TSH, BMET, CBC and Magnesium today  If you have labs (blood work) drawn today and your tests are completely normal, you will receive your results only by: [name] Kitchen MyChart Message (if you have MyChart) OR . A paper copy in the mail If you have any lab test that is abnormal or we need to change your treatment, we will call you to review the results.  Testing/Procedures: None  Follow-Up: At [facility], you and your health needs are our priority.  As part of our continuing mission to provide you with exceptional heart care, we have created designated Provider Care Teams.  These Care Teams include your primary Cardiologist (physician) and Advanced Practice Providers (APPs -  Physician Assistants and Nurse Practitioners) who all work together to provide you with the care you need, when you need it. You will need a follow up appointment in 3 months.  Please call our office 2 months in advance to schedule this appointment.  You may see Dr. [name] or one of the following Advanced Practice Providers on your designated Care Team:   Truitt Merle, NP Cecilie Kicks,  NP . Kathyrn Drown, NP  Any Other Special Instructions Will Be Listed Below (If Applicable).       Signed, Sinclair Grooms, MD  11/27/2018 4:59 PM    [facility]

## 2018-11-27 ENCOUNTER — Encounter: Payer: Self-pay | Admitting: Interventional Cardiology

## 2018-11-27 ENCOUNTER — Ambulatory Visit: Payer: Medicare Other | Admitting: Interventional Cardiology

## 2018-11-27 VITALS — BP 142/68 | HR 66 | Ht 70.0 in | Wt 179.4 lb

## 2018-11-27 DIAGNOSIS — R0609 Other forms of dyspnea: Secondary | ICD-10-CM | POA: Diagnosis not present

## 2018-11-27 DIAGNOSIS — E118 Type 2 diabetes mellitus with unspecified complications: Secondary | ICD-10-CM

## 2018-11-27 DIAGNOSIS — I493 Ventricular premature depolarization: Secondary | ICD-10-CM | POA: Diagnosis not present

## 2018-11-27 DIAGNOSIS — Z953 Presence of xenogenic heart valve: Secondary | ICD-10-CM

## 2018-11-27 DIAGNOSIS — R0600 Dyspnea, unspecified: Secondary | ICD-10-CM

## 2018-11-27 DIAGNOSIS — I10 Essential (primary) hypertension: Secondary | ICD-10-CM

## 2018-11-27 DIAGNOSIS — R002 Palpitations: Secondary | ICD-10-CM | POA: Diagnosis not present

## 2018-11-27 DIAGNOSIS — I25119 Atherosclerotic heart disease of native coronary artery with unspecified angina pectoris: Secondary | ICD-10-CM

## 2018-11-27 MED ORDER — AMLODIPINE BESYLATE 5 MG PO TABS: 5.0000 mg | ORAL_TABLET | Freq: Every day | ORAL | 3 refills | Status: DC

## 2018-11-27 NOTE — Patient Instructions (Signed)
Medication Instructions:  1) INCREASE Amlodipine to 5mg  once daily If you need a refill on your cardiac medications before your next appointment, please call your pharmacy.   Lab work: TSH, BMET, CBC and Magnesium today  If you have labs (blood work) drawn today and your tests are completely normal, you will receive your results only by: [name] Kitchen MyChart Message (if you have MyChart) OR . A paper copy in the mail If you have any lab test that is abnormal or we need to change your treatment, we will call you to review the results.  Testing/Procedures: None  Follow-Up: At [facility], you and your health needs are our priority.  As part of our continuing mission to provide you with exceptional heart care, we have created designated Provider Care Teams.  These Care Teams include your primary Cardiologist (physician) and Advanced Practice Providers (APPs -  Physician Assistants and Nurse Practitioners) who all work together to provide you with the care you need, when you need it. You will need a follow up appointment in 3 months.  Please call our office 2 months in advance to schedule this appointment.  You may see Dr. [name] or one of the following Advanced Practice Providers on your designated Care Team:   Truitt Merle, NP Cecilie Kicks, NP . Kathyrn Drown, NP  Any Other Special Instructions Will Be Listed Below (If Applicable).

## 2018-11-28 ENCOUNTER — Telehealth: Payer: Self-pay | Admitting: Interventional Cardiology

## 2018-11-28 DIAGNOSIS — E875 Hyperkalemia: Secondary | ICD-10-CM

## 2018-11-28 DIAGNOSIS — R7989 Other specified abnormal findings of blood chemistry: Secondary | ICD-10-CM

## 2018-11-28 LAB — BASIC METABOLIC PANEL
BUN/Creatinine Ratio: 16 (ref 10–24)
BUN: 28 mg/dL — ABNORMAL HIGH (ref 8–27)
CO2: 19 mmol/L — ABNORMAL LOW (ref 20–29)
CREATININE: 1.76 mg/dL — AB (ref 0.76–1.27)
Calcium: 10.6 mg/dL — ABNORMAL HIGH (ref 8.6–10.2)
Chloride: 101 mmol/L (ref 96–106)
GFR calc Af Amer: 42 mL/min/{1.73_m2} — ABNORMAL LOW (ref >59)
GFR calc non Af Amer: 36 mL/min/{1.73_m2} — ABNORMAL LOW (ref >59)
Glucose: 109 mg/dL — ABNORMAL HIGH (ref 65–99)
Potassium: 5.3 mmol/L — ABNORMAL HIGH (ref 3.5–5.2)
Sodium: 138 mmol/L (ref 134–144)

## 2018-11-28 LAB — CBC
HEMOGLOBIN: 9.4 g/dL — AB (ref 13.0–17.7)
Hematocrit: 30.3 % — ABNORMAL LOW (ref 37.5–51.0)
MCH: 22.4 pg — ABNORMAL LOW (ref 26.6–33.0)
MCHC: 31 g/dL — ABNORMAL LOW (ref 31.5–35.7)
MCV: 72 fL — ABNORMAL LOW (ref 79–97)
Platelets: 378 10*3/uL (ref 150–450)
RBC: 4.19 x10E6/uL (ref 4.14–5.80)
RDW: 16.7 % — ABNORMAL HIGH (ref 11.6–15.4)
WBC: 11.7 10*3/uL — ABNORMAL HIGH (ref 3.4–10.8)

## 2018-11-28 LAB — MAGNESIUM: Magnesium: 1.7 mg/dL (ref 1.6–2.3)

## 2018-11-28 LAB — TSH: TSH: 1.82 u[IU]/mL (ref 0.450–4.500)

## 2018-11-28 NOTE — Telephone Encounter (Signed)
Spoke with Dr. [name] and he said to have pt hold HCTZ and ARB and repeat labs in 1 week.  Spoke with pt and advised him of recommendations.  Pt will come 1/15 for repeat labs.

## 2018-11-29 ENCOUNTER — Encounter: Payer: Medicare Other | Admitting: *Deleted

## 2018-11-29 DIAGNOSIS — Z951 Presence of aortocoronary bypass graft: Secondary | ICD-10-CM | POA: Diagnosis not present

## 2018-11-29 DIAGNOSIS — Z952 Presence of prosthetic heart valve: Secondary | ICD-10-CM

## 2018-11-29 NOTE — Progress Notes (Signed)
Daily Session Note  Patient Details  Name: Daniel Mendez MRN: [id number] Date of Birth: Jan 18, 1941 Referring Provider:     Cardiac Rehab from 10/26/2018 in [facility] Cardiac and Pulmonary Rehab  Referring Provider  [name]      Encounter Date: 11/29/2018  Check In: Session Check In - 11/29/18 1618      Check-In   Supervising physician immediately available to respond to emergencies  See telemetry face sheet for immediately available ER MD    Location  [facility]-Cardiac & Pulmonary Rehab    Staff Present  Renita Papa, RN BSN;Carroll Enterkin, RN, Vickki Hearing, BA, ACSM CEP, Exercise Physiologist    Medication changes reported      No    Fall or balance concerns reported     No    Tobacco Cessation  No Change    Warm-up and Cool-down  Performed as group-led instruction    Resistance Training Performed  Yes    VAD Patient?  No    PAD/SET Patient?  No      Pain Assessment   Currently in Pain?  No/denies          Social History   Tobacco Use  Smoking Status Never Smoker  Smokeless Tobacco Never Used    Goals Met:  Independence with exercise equipment Exercise tolerated well No report of cardiac concerns or symptoms Strength training completed today  Goals Unmet:  Not Applicable  Comments: Pt able to follow exercise prescription today without complaint.  Will continue to monitor for progression.    Dr. Emily Filbert is Medical Director for [place] and [facility].

## 2018-11-30 ENCOUNTER — Encounter: Payer: Medicare Other | Admitting: *Deleted

## 2018-11-30 DIAGNOSIS — Z951 Presence of aortocoronary bypass graft: Secondary | ICD-10-CM

## 2018-11-30 DIAGNOSIS — Z952 Presence of prosthetic heart valve: Secondary | ICD-10-CM

## 2018-11-30 NOTE — Progress Notes (Signed)
Daily Session Note  Patient Details  Name: Daniel Mendez MRN: [id number] Date of Birth: 11-Jun-1941 Referring Provider:     Cardiac Rehab from 10/26/2018 in [facility] Cardiac and Pulmonary Rehab  Referring Provider  [name]      Encounter Date: 11/30/2018  Check In:      Social History   Tobacco Use  Smoking Status Never Smoker  Smokeless Tobacco Never Used    Goals Met:  Independence with exercise equipment Exercise tolerated well No report of cardiac concerns or symptoms  Goals Unmet:  Not Applicable  Comments: Pt able to follow exercise prescription today without complaint.  Will continue to monitor for progression.    Dr. Emily Filbert is Medical Director for [place] and [facility].

## 2018-12-04 ENCOUNTER — Encounter: Payer: Medicare Other | Admitting: *Deleted

## 2018-12-04 DIAGNOSIS — Z952 Presence of prosthetic heart valve: Secondary | ICD-10-CM

## 2018-12-04 DIAGNOSIS — Z951 Presence of aortocoronary bypass graft: Secondary | ICD-10-CM | POA: Diagnosis not present

## 2018-12-04 NOTE — Progress Notes (Signed)
Daily Session Note  Patient Details  Name: AYDN FERRARA MRN: [id number] Date of Birth: 1941/10/05 Referring Provider:     Cardiac Rehab from 10/26/2018 in [facility] Cardiac and Pulmonary Rehab  Referring Provider  [name]      Encounter Date: 12/04/2018  Check In: Session Check In - 12/04/18 1741      Check-In   Supervising physician immediately available to respond to emergencies  See telemetry face sheet for immediately available ER MD    Location  [facility]-Cardiac & Pulmonary Rehab    Staff Present  Earlean Shawl, BS, ACSM CEP, Exercise Physiologist;Carroll Enterkin, RN, Vickki Hearing, BA, ACSM CEP, Exercise Physiologist    Medication changes reported      No    Fall or balance concerns reported     No    Tobacco Cessation  No Change    Warm-up and Cool-down  Performed as group-led instruction    Resistance Training Performed  Yes    VAD Patient?  No    PAD/SET Patient?  No      Pain Assessment   Currently in Pain?  No/denies    Multiple Pain Sites  No          Social History   Tobacco Use  Smoking Status Never Smoker  Smokeless Tobacco Never Used    Goals Met:  Independence with exercise equipment Exercise tolerated well No report of cardiac concerns or symptoms Strength training completed today  Goals Unmet:  Not Applicable  Comments: Pt able to follow exercise prescription today without complaint.  Will continue to monitor for progression.    Dr. Emily Filbert is Medical Director for [place] and [facility].

## 2018-12-06 ENCOUNTER — Other Ambulatory Visit: Payer: Medicare Other | Admitting: *Deleted

## 2018-12-06 ENCOUNTER — Encounter: Payer: Medicare Other | Admitting: *Deleted

## 2018-12-06 DIAGNOSIS — Z951 Presence of aortocoronary bypass graft: Secondary | ICD-10-CM

## 2018-12-06 DIAGNOSIS — Z952 Presence of prosthetic heart valve: Secondary | ICD-10-CM

## 2018-12-06 DIAGNOSIS — E875 Hyperkalemia: Secondary | ICD-10-CM

## 2018-12-06 DIAGNOSIS — R7989 Other specified abnormal findings of blood chemistry: Secondary | ICD-10-CM

## 2018-12-06 LAB — BASIC METABOLIC PANEL
BUN/Creatinine Ratio: 15 (ref 10–24)
BUN: 21 mg/dL (ref 8–27)
CO2: 22 mmol/L (ref 20–29)
Calcium: 10.9 mg/dL — ABNORMAL HIGH (ref 8.6–10.2)
Chloride: 103 mmol/L (ref 96–106)
Creatinine, Ser: 1.37 mg/dL — ABNORMAL HIGH (ref 0.76–1.27)
GFR calc non Af Amer: 49 mL/min/{1.73_m2} — ABNORMAL LOW (ref >59)
GFR, EST AFRICAN AMERICAN: 56 mL/min/{1.73_m2} — AB (ref >59)
Glucose: 98 mg/dL (ref 65–99)
Potassium: 5.1 mmol/L (ref 3.5–5.2)
Sodium: 140 mmol/L (ref 134–144)

## 2018-12-06 NOTE — Progress Notes (Signed)
Daily Session Note  Patient Details  Name: Daniel Mendez MRN: [id number] Date of Birth: 1941-07-16 Referring Provider:     Cardiac Rehab from 10/26/2018 in [facility] Cardiac and Pulmonary Rehab  Referring Provider  [name]      Encounter Date: 12/06/2018  Check In: Session Check In - 12/06/18 1617      Check-In   Supervising physician immediately available to respond to emergencies  See telemetry face sheet for immediately available ER MD    Location  [facility]-Cardiac & Pulmonary Rehab    Staff Present  Renita Papa, RN BSN;Carroll Enterkin, RN, Vickki Hearing, BA, ACSM CEP, Exercise Physiologist    Medication changes reported      No    Fall or balance concerns reported     No    Tobacco Cessation  No Change    Warm-up and Cool-down  Performed as group-led instruction    Resistance Training Performed  Yes    VAD Patient?  No    PAD/SET Patient?  No      Pain Assessment   Currently in Pain?  No/denies          Social History   Tobacco Use  Smoking Status Never Smoker  Smokeless Tobacco Never Used    Goals Met:  Independence with exercise equipment Exercise tolerated well No report of cardiac concerns or symptoms Strength training completed today  Goals Unmet:  Not Applicable  Comments: Pt able to follow exercise prescription today without complaint.  Will continue to monitor for progression.    Dr. Emily Filbert is Medical Director for [place] and [facility].

## 2018-12-07 ENCOUNTER — Other Ambulatory Visit: Payer: Self-pay | Admitting: *Deleted

## 2018-12-07 DIAGNOSIS — Z951 Presence of aortocoronary bypass graft: Secondary | ICD-10-CM

## 2018-12-07 NOTE — Progress Notes (Signed)
Great.  Thanks

## 2018-12-07 NOTE — Progress Notes (Signed)
Daily Session Note  Patient Details  Name: Daniel Mendez MRN: [id number] Date of Birth: February 16, 1941 Referring Provider:     Cardiac Rehab from 10/26/2018 in [facility] Cardiac and Pulmonary Rehab  Referring Provider  [name]      Encounter Date: 12/07/2018  Check In: Session Check In - 12/07/18 1634      Check-In   Supervising physician immediately available to respond to emergencies  See telemetry face sheet for immediately available ER MD    Location  [facility]-Cardiac & Pulmonary Rehab    Staff Present  Renita Papa, RN BSN;Debany Vantol [address] [place], [place], ACSM CEP, Exercise Physiologist    Medication changes reported      No    Fall or balance concerns reported     No    Warm-up and Cool-down  Performed as group-led instruction    Resistance Training Performed  Yes    VAD Patient?  No    PAD/SET Patient?  No      Pain Assessment   Currently in Pain?  No/denies          Social History   Tobacco Use  Smoking Status Never Smoker  Smokeless Tobacco Never Used    Goals Met:  Independence with exercise equipment Exercise tolerated well No report of cardiac concerns or symptoms Strength training completed today  Goals Unmet:  Not Applicable  Comments: Pt able to follow exercise prescription today without complaint.  Will continue to monitor for progression.    Dr. Emily Filbert is Medical Director for [place] and [facility].

## 2018-12-11 ENCOUNTER — Other Ambulatory Visit: Payer: Self-pay | Admitting: *Deleted

## 2018-12-11 DIAGNOSIS — Z951 Presence of aortocoronary bypass graft: Secondary | ICD-10-CM

## 2018-12-11 DIAGNOSIS — Z952 Presence of prosthetic heart valve: Secondary | ICD-10-CM

## 2018-12-11 MED ORDER — METOPROLOL SUCCINATE ER 25 MG PO TB24: 12.5000 mg | ORAL_TABLET | Freq: Every day | ORAL | 3 refills | Status: DC

## 2018-12-11 NOTE — Progress Notes (Signed)
Daily Session Note  Patient Details  Name: Daniel Mendez MRN: [id number] Date of Birth: August 22, 1941 Referring Provider:     Cardiac Rehab from 10/26/2018 in [facility] Cardiac and Pulmonary Rehab  Referring Provider  [name]      Encounter Date: 12/11/2018  Check In: Session Check In - 12/11/18 1726      Check-In   Supervising physician immediately available to respond to emergencies  See telemetry face sheet for immediately available ER MD    Location  [facility]-Cardiac & Pulmonary Rehab    Staff Present  Earlean Shawl, BS, ACSM CEP, Exercise Physiologist;Carroll Enterkin, RN, Vickki Hearing, BA, ACSM CEP, Exercise Physiologist    Medication changes reported      No    Fall or balance concerns reported     No    Warm-up and Cool-down  Performed as group-led instruction    Resistance Training Performed  Yes    VAD Patient?  No    PAD/SET Patient?  No      Pain Assessment   Currently in Pain?  No/denies    Multiple Pain Sites  No          Social History   Tobacco Use  Smoking Status Never Smoker  Smokeless Tobacco Never Used    Goals Met:  Independence with exercise equipment Exercise tolerated well No report of cardiac concerns or symptoms Strength training completed today  Goals Unmet:  Not Applicable  Comments: Pt able to follow exercise prescription today without complaint.  Will continue to monitor for progression.    Dr. Emily Filbert is Medical Director for [place] and [facility].

## 2018-12-11 NOTE — Telephone Encounter (Signed)
Received [facility] from [facility] stating that the patient is taking one full tablet of metoprolol succinate 25 mg and he is requesting an rx reflecting this dose. Please advise as current med list and recent office visit 11/27/18 has 12.5 mg qd listed. Thanks, MI

## 2018-12-11 NOTE — Telephone Encounter (Signed)
Spoke with pt and clarified that he is taking a half tablet, not the whole tablet.  Ok to send in 1/2 tablet.  Thanks!

## 2018-12-11 NOTE — Telephone Encounter (Signed)
Left message to call back  

## 2018-12-13 ENCOUNTER — Encounter: Payer: Medicare Other | Admitting: *Deleted

## 2018-12-13 DIAGNOSIS — Z952 Presence of prosthetic heart valve: Secondary | ICD-10-CM

## 2018-12-13 DIAGNOSIS — Z951 Presence of aortocoronary bypass graft: Secondary | ICD-10-CM | POA: Diagnosis not present

## 2018-12-13 LAB — GLUCOSE, CAPILLARY: Glucose-Capillary: 133 mg/dL — ABNORMAL HIGH (ref 70–99)

## 2018-12-13 NOTE — Progress Notes (Signed)
Daily Session Note  Patient Details  Name: Daniel Mendez MRN: [id number] Date of Birth: [birth year] Referring Provider:     Cardiac Rehab from 10/26/2018 in [facility] Cardiac and Pulmonary Rehab  Referring Provider  [name]      Encounter Date: 12/13/2018  Check In: Session Check In - 12/13/18 1704      Check-In   Supervising physician immediately available to respond to emergencies  See telemetry face sheet for immediately available ER MD    Location  [facility]-Cardiac & Pulmonary Rehab    Staff Present  Renita Papa, RN BSN;Carroll Enterkin, RN, Vickki Hearing, BA, ACSM CEP, Exercise Physiologist    Medication changes reported      No    Fall or balance concerns reported     No    Warm-up and Cool-down  Performed as group-led instruction    Resistance Training Performed  Yes    VAD Patient?  No    PAD/SET Patient?  No      Pain Assessment   Currently in Pain?  No/denies          Social History   Tobacco Use  Smoking Status Never Smoker  Smokeless Tobacco Never Used    Goals Met:  Independence with exercise equipment Exercise tolerated well No report of cardiac concerns or symptoms Strength training completed today  Goals Unmet:  Not Applicable  Comments: Pt able to follow exercise prescription today without complaint.  Will continue to monitor for progression.    Dr. Emily Filbert is Medical Director for [place] and [facility].

## 2018-12-14 ENCOUNTER — Telehealth: Payer: Self-pay | Admitting: Interventional Cardiology

## 2018-12-14 VITALS — Ht 70.5 in | Wt 181.0 lb

## 2018-12-14 DIAGNOSIS — Z951 Presence of aortocoronary bypass graft: Secondary | ICD-10-CM | POA: Diagnosis not present

## 2018-12-14 NOTE — Telephone Encounter (Signed)
Spoke with Tammie at [facility] and reviewed pt's medications.  Advised we have Amlodipine 5 QD, Telmisartan 20mg  QD and Metoprolol Succinate 12.5mg  QD on file.

## 2018-12-14 NOTE — Progress Notes (Signed)
Daily Session Note  Patient Details  Name: NOBLE CICALESE MRN: [id number] Date of Birth: Jul 14, 1941 Referring Provider:     Cardiac Rehab from 10/26/2018 in [facility] Cardiac and Pulmonary Rehab  Referring Provider  [name]      Encounter Date: 12/14/2018  Check In: Session Check In - 12/14/18 [place]      Check-In   Supervising physician immediately available to respond to emergencies  See telemetry face sheet for immediately available ER MD    Location  [facility]-Cardiac & Pulmonary Rehab    Staff Present  Justin Mend RCP,RRT,BSRT;Meredith Sherryll Burger, RN Moises Blood, BS, ACSM CEP, Exercise Physiologist    Medication changes reported      No    Fall or balance concerns reported     No    Warm-up and Cool-down  Performed as group-led instruction    Resistance Training Performed  Yes    VAD Patient?  No    PAD/SET Patient?  No      Pain Assessment   Currently in Pain?  No/denies          Social History   Tobacco Use  Smoking Status Never Smoker  Smokeless Tobacco Never Used    Goals Met:  Independence with exercise equipment Exercise tolerated well No report of cardiac concerns or symptoms Strength training completed today  Goals Unmet:  Not Applicable  Comments: Pt able to follow exercise prescription today without complaint.  Will continue to monitor for progression.  [place] Name 10/26/18 1438 12/14/18 1705       6 Minute Walk   Phase  Initial  Discharge    Distance  1342 feet  1660 feet    Distance % Change  -  24 %    Distance Feet Change  -  318 ft    Walk Time  6 minutes  6 minutes    # of Rest Breaks  0  0    MPH  2.54  3.14    METS  3.2  3.75    RPE  11  15    Perceived Dyspnea   0  1    VO2 Peak  11.17  13.12    Symptoms  No  No    Resting HR  71 bpm  62 bpm    Resting BP  148/60  132/80    Resting Oxygen Saturation   97 %  93 %    Max Ex. HR  105 bpm  107 bpm    Max Ex. BP  180/66  190/82    2 Minute Post BP  144/68  170/72          Dr. Emily Filbert is Medical Director for [facility].

## 2018-12-14 NOTE — Telephone Encounter (Signed)
  Patient is taking numerous medications differently than what is on his list. Tammy @ [facility] needs to verify how he is supposed to be taking these meds  amLODipine (NORVASC) 5 MG tablet metoprolol succinate (TOPROL-XL) 25 MG 24 hr tablet- taking whole tab daily telmisartan (MICARDIS) 40 MG tablet - quit taking

## 2018-12-18 ENCOUNTER — Encounter: Payer: Medicare Other | Admitting: *Deleted

## 2018-12-18 DIAGNOSIS — Z951 Presence of aortocoronary bypass graft: Secondary | ICD-10-CM

## 2018-12-18 DIAGNOSIS — Z952 Presence of prosthetic heart valve: Secondary | ICD-10-CM

## 2018-12-18 NOTE — Progress Notes (Signed)
Daily Session Note  Patient Details  Name: Daniel Mendez MRN: [id number] Date of Birth: 1941/02/01 Referring Provider:     Cardiac Rehab from 10/26/2018 in [facility] Cardiac and Pulmonary Rehab  Referring Provider  [name]      Encounter Date: 12/18/2018  Check In: Session Check In - 12/18/18 1702      Check-In   Supervising physician immediately available to respond to emergencies  See telemetry face sheet for immediately available ER MD    Location  [facility]-Cardiac & Pulmonary Rehab    Staff Present  Earlean Shawl, BS, ACSM CEP, Exercise Physiologist;Carroll Enterkin, RN, Vickki Hearing, BA, ACSM CEP, Exercise Physiologist    Medication changes reported      No    Fall or balance concerns reported     No    Tobacco Cessation  No Change    Warm-up and Cool-down  Not performed (comment)    Resistance Training Performed  Yes    VAD Patient?  No    PAD/SET Patient?  No      Pain Assessment   Currently in Pain?  No/denies    Multiple Pain Sites  No          Social History   Tobacco Use  Smoking Status Never Smoker  Smokeless Tobacco Never Used    Goals Met:  Independence with exercise equipment Exercise tolerated well No report of cardiac concerns or symptoms Strength training completed today  Goals Unmet:  Not Applicable  Comments: Pt able to follow exercise prescription today without complaint.  Will continue to monitor for progression.    Dr. Emily Filbert is Medical Director for [facility] and [facility].

## 2018-12-20 ENCOUNTER — Encounter: Payer: Medicare Other | Admitting: *Deleted

## 2018-12-20 ENCOUNTER — Encounter: Payer: Self-pay | Admitting: *Deleted

## 2018-12-20 DIAGNOSIS — Z951 Presence of aortocoronary bypass graft: Secondary | ICD-10-CM

## 2018-12-20 DIAGNOSIS — Z952 Presence of prosthetic heart valve: Secondary | ICD-10-CM

## 2018-12-20 NOTE — Progress Notes (Signed)
Cardiac Individual Treatment Plan  Patient Details  Name: Daniel Mendez MRN: [id number] Date of Birth: 01/26/1941 Referring Provider:     Cardiac Rehab from 10/26/2018 in [facility] Cardiac and Pulmonary Rehab  Referring Provider  [name]      Initial Encounter Date:    Cardiac Rehab from 10/26/2018 in [facility] Cardiac and Pulmonary Rehab  Date  10/26/18      Visit Diagnosis: S/P CABG x 1  S/P TAVR (transcatheter aortic valve replacement)  Patient's Home Medications on Admission:  Current Outpatient Medications:  .  acetaminophen (TYLENOL) 325 MG tablet, Take 2 tablets (650 mg total) by mouth every 6 (six) hours as needed for mild pain., Disp: , Rfl:  .  amLODipine (NORVASC) 5 MG tablet, Take 1 tablet (5 mg total) by mouth daily., Disp: 90 tablet, Rfl: 3 .  aspirin 325 MG EC tablet, Take 1 tablet (325 mg total) by mouth daily., Disp: 90 tablet, Rfl: 0 .  atorvastatin (LIPITOR) 20 MG tablet, Take 20 mg by mouth daily at 12 noon. , Disp: , Rfl:  .  cyclobenzaprine (FLEXERIL) 5 MG tablet, Take 2.5 mg by mouth daily as needed for muscle spasms. , Disp: , Rfl:  .  glimepiride (AMARYL) 4 MG tablet, Take 4 mg by mouth daily with breakfast., Disp: , Rfl:  .  metFORMIN (GLUCOPHAGE) 500 MG tablet, Take 1,000 mg by mouth 2 (two) times daily. , Disp: , Rfl:  .  metoprolol succinate (TOPROL-XL) 25 MG 24 hr tablet, Take 0.5 tablets (12.5 mg total) by mouth daily., Disp: 45 tablet, Rfl: 3 .  Omega-3 Fatty Acids (FISH OIL) 1000 MG CAPS, Take 1,000 mg by mouth 2 (two) times daily. , Disp: , Rfl:  .  omeprazole (PRILOSEC) 20 MG capsule, Take 20 mg by mouth every other day. At noon, Disp: , Rfl:  .  ONE TOUCH ULTRA TEST test strip, 1 each by Other route daily. , Disp: , Rfl:  .  ONETOUCH DELICA LANCETS 96P MISC, 1 each by Other route daily. , Disp: , Rfl:  .  sitaGLIPtin (JANUVIA) 100 MG tablet, Take 100 mg by mouth daily with lunch. , Disp: , Rfl:  .  tamsulosin (FLOMAX) 0.4 MG CAPS capsule, Take 0.4 mg by  mouth every evening., Disp: , Rfl:  .  telmisartan (MICARDIS) 40 MG tablet, Take 20 mg by mouth daily., Disp: , Rfl:  .  testosterone cypionate (DEPOTESTOSTERONE CYPIONATE) 200 MG/ML injection, Inject 200 mg into the muscle every 14 (fourteen) days., Disp: , Rfl:  .  traMADol (ULTRAM) 50 MG tablet, Take 50 mg by mouth every 4-6 hours PRN severe pain, Disp: 28 tablet, Rfl: 0  Past Medical History: Past Medical History:  Diagnosis Date  . Anginal pain ([place])   . Arthritis    hand- right  . Coronary artery disease   . Diabetes ([place])    type 2  . GERD (gastroesophageal reflux disease)   . Heart murmur    discovered in 2015 - followed by Dr. Daneen Schick  . History of hiatal hernia   . History of kidney stones   . HTN (hypertension)   . Hypercalcemia   . Hyperglyceridemia   . Hyperlipidemia   . Mild aortic stenosis Oct 2014  . Peripheral vascular disease ([place])   . Pneumonia 07/28/2018  . Shortness of breath dyspnea    on exertion  . Sleep apnea    no cpap    Tobacco Use: Social History   Tobacco Use  Smoking  Status Never Smoker  Smokeless Tobacco Never Used    Labs: Recent Review Flowsheet Data    Labs for ITP Cardiac and Pulmonary Rehab Latest Ref Rng & Units 09/18/2018 09/18/2018 09/18/2018 09/18/2018 09/19/2018   Hemoglobin A1c 4.8 - 5.6 % - - - - -   PHART 7.350 - 7.450 7.358 7.348(L) 7.323(L) - -   PCO2ART 32.0 - 48.0 mmHg 43.6 40.2 45.2 - -   HCO3 20.0 - 28.0 mmol/L 24.8 22.1 23.4 - -   TCO2 22 - 32 mmol/L _0 ACIDBASEDEF 0.0 - 2.0 mmol/L 1.0 3.0(H) 3.0(H) - -   O2SAT % 99.0 96.0 94.0 - -       Exercise Target Goals: Exercise Program Goal: Individual exercise prescription set using results from initial 6 min walk test and THRR while considering  patient's activity barriers and safety.   Exercise Prescription Goal: Initial exercise prescription builds to 30-45 minutes a day of aerobic activity, 2-3 days per week.  Home exercise guidelines will be  given to patient during program as part of exercise prescription that the participant will acknowledge.  Activity Barriers & Risk Stratification: Activity Barriers & Cardiac Risk Stratification - 10/26/18 1424      Activity Barriers & Cardiac Risk Stratification   Activity Barriers  Joint Problems;Shortness of Breath    Cardiac Risk Stratification  Moderate       6 Minute Walk: 6 Minute Walk    Row Name 10/26/18 1438 12/14/18 1705       6 Minute Walk   Phase  Initial  Discharge    Distance  1342 feet  1660 feet    Distance % Change  -  24 %    Distance Feet Change  -  318 ft    Walk Time  6 minutes  6 minutes    # of Rest Breaks  0  0    MPH  2.54  3.14    METS  3.2  3.75    RPE  11  15    Perceived Dyspnea   0  1    VO2 Peak  11.17  13.12    Symptoms  No  No    Resting HR  71 bpm  62 bpm    Resting BP  148/60  132/80    Resting Oxygen Saturation   97 %  93 %    Max Ex. HR  105 bpm  107 bpm    Max Ex. BP  180/66  190/82    2 Minute Post BP  144/68  170/72       Oxygen Initial Assessment:   Oxygen Re-Evaluation:   Oxygen Discharge (Final Oxygen Re-Evaluation):   Initial Exercise Prescription: Initial Exercise Prescription - 10/26/18 1400      Date of Initial Exercise RX and Referring Provider   Date  10/26/18    Referring Provider  [name]      Treadmill   MPH  2.5    Grade  0.5    Minutes  15    METs  3.09      Recumbant Bike   Level  3    RPM  60    Watts  32    Minutes  15    METs  3      REL-XR   Level  3    Speed  50    Minutes  15    METs  3  Prescription Details   Frequency (times per week)  3    Duration  Progress to 45 minutes of aerobic exercise without signs/symptoms of physical distress      Intensity   THRR 40-80% of Max Heartrate  99-127    Ratings of Perceived Exertion  11-13    Perceived Dyspnea  0-4      Resistance Training   Training Prescription  Yes    Weight  3 lb    Reps  10-15       Perform Capillary  Blood Glucose checks as needed.  Exercise Prescription Changes: Exercise Prescription Changes    Row Name 10/26/18 1400 11/01/18 1100 11/09/18 0800 11/14/18 1000 11/29/18 1300     Response to Exercise   Blood Pressure (Admit)  148/60  160/70  -  122/80  144/60   Blood Pressure (Exercise)  180/66  142/80  -  148/52  158/60   Blood Pressure (Exit)  144/68  104/60  -  110/64  104/60   Heart Rate (Admit)  64 bpm  62 bpm  -  63 bpm  74 bpm   Heart Rate (Exercise)  105 bpm  106 bpm  -  101 bpm  119 bpm   Heart Rate (Exit)  68 bpm  84 bpm  -  69 bpm  85 bpm   Oxygen Saturation (Admit)  97 %  -  -  -  -   Rating of Perceived Exertion (Exercise)  11  13  -  12  14   Symptoms  -  -  -  none  none   Comments  -  first day  -  -  -   Duration  -  Progress to 45 minutes of aerobic exercise without signs/symptoms of physical distress  -  Progress to 45 minutes of aerobic exercise without signs/symptoms of physical distress  Progress to 45 minutes of aerobic exercise without signs/symptoms of physical distress   Intensity  -  THRR unchanged  -  THRR unchanged  THRR unchanged     Progression   Progression  -  Continue to progress workloads to maintain intensity without signs/symptoms of physical distress.  -  Continue to progress workloads to maintain intensity without signs/symptoms of physical distress.  Continue to progress workloads to maintain intensity without signs/symptoms of physical distress.   Average METs  -  2.8  -  -  3.9     Resistance Training   Training Prescription  -  Yes  -  Yes  Yes   Weight  -  3 lb  -  3 lb  3 lb   Reps  -  10-15  -  10-15  10-15     Interval Training   Interval Training  -  No  -  No  No     Treadmill   MPH  -  2.2  -  2.5  2.5   Grade  -  0.5  -  2  2   Minutes  -  15  -  15  15   METs  -  2.84  -  3.6  3.6     Recumbant Bike   Level  -  -  -  6  6   RPM  -  -  -  60  60   Watts  -  -  -  36  32   Minutes  -  -  -  15  15  METs  -  -  -  3.4  3.35      REL-XR   Level  -  3  -  -  6   Speed  -  50  -  -  50   Minutes  -  15  -  -  15   METs  -  2.8  -  -  5     Home Exercise Plan   Plans to continue exercise at  -  -  [employer] (comment) [place] (comment) [place] (comment) [place] 2 additional days to program exercise sessions.  Add 2 additional days to program exercise sessions.  Add 2 additional days to program exercise sessions.   Initial Home Exercises Provided  -  -  11/09/18  11/09/18  11/09/18   Row Name 12/13/18 1500             Response to Exercise   Blood Pressure (Admit)  140/70       Blood Pressure (Exit)  122/64       Heart Rate (Admit)  65 bpm       Heart Rate (Exercise)  99 bpm       Heart Rate (Exit)  63 bpm       Rating of Perceived Exertion (Exercise)  14       Symptoms  none       Duration  Continue with 45 min of aerobic exercise without signs/symptoms of physical distress.       Intensity  THRR unchanged         Progression   Progression  Continue to progress workloads to maintain intensity without signs/symptoms of physical distress.       Average METs  4         Resistance Training   Training Prescription  Yes       Weight  3 lb       Reps  10-15         Interval Training   Interval Training  No         Treadmill   MPH  2.5       Grade  2       Minutes  15       METs  3.6         Recumbant Bike   Level  7       RPM  60       Watts  39       Minutes  15       METs  3.4         REL-XR   Level  7       Speed  50       Minutes  15       METs  5         Home Exercise Plan   Plans to continue exercise at  [employer] (comment) [facility]       Frequency  Add 2 additional days to program exercise sessions.       Initial Home Exercises Provided  11/09/18          Exercise Comments: Exercise Comments    Row Name 10/30/18 1722 11/30/18 1604          Exercise Comments  First full day of exercise!  Patient was oriented  to gym and equipment including functions, settings, policies, and procedures.  Patient's individual exercise prescription and treatment plan were reviewed.  All starting workloads were established based on the results of the 6 minute walk test done at initial orientation visit.  The plan for exercise progression was also introduced and progression will be customized based on patient's performance and goals.  Reviewed METs and progress today.  TM is hard "legs weak and out of breath".  Advised him to stay at that level until it becomes 11-12 RPE.         Exercise Goals and Review: Exercise Goals    Row Name 10/26/18 1438             Exercise Goals   Increase Physical Activity  Yes       Intervention  Provide advice, education, support and counseling about physical activity/exercise needs.;Develop an individualized exercise prescription for aerobic and resistive training based on initial evaluation findings, risk stratification, comorbidities and participant's personal goals.       Expected Outcomes  Short Term: Attend rehab on a regular basis to increase amount of physical activity.;Long Term: Exercising regularly at least 3-5 days a week.;Long Term: Add in home exercise to make exercise part of routine and to increase amount of physical activity.       Increase Strength and Stamina  Yes       Intervention  Provide advice, education, support and counseling about physical activity/exercise needs.;Develop an individualized exercise prescription for aerobic and resistive training based on initial evaluation findings, risk stratification, comorbidities and participant's personal goals.       Expected Outcomes  Short Term: Increase workloads from initial exercise prescription for resistance, speed, and METs.;Short Term: Perform resistance training exercises routinely during rehab and add in resistance training at home;Long Term: Improve  cardiorespiratory fitness, muscular endurance and strength as measured by increased METs and functional capacity (6MWT)       Able to understand and use rate of perceived exertion (RPE) scale  Yes       Intervention  Provide education and explanation on how to use RPE scale       Expected Outcomes  Short Term: Able to use RPE daily in rehab to express subjective intensity level;Long Term:  Able to use RPE to guide intensity level when exercising independently       Knowledge and understanding of Target Heart Rate Range (THRR)  Yes       Intervention  Provide education and explanation of THRR including how the numbers were predicted and where they are located for reference       Expected Outcomes  Short Term: Able to state/look up THRR;Short Term: Able to use daily as guideline for intensity in rehab;Long Term: Able to use THRR to govern intensity when exercising independently       Able to check pulse independently  Yes       Intervention  Provide education and demonstration on how to check pulse in carotid and radial arteries.;Review the importance of being able to check your own pulse for safety during independent exercise       Expected Outcomes  Short Term: Able to explain why pulse checking is important during independent exercise;Long Term: Able to check pulse independently and accurately       Understanding of Exercise Prescription  Yes       Intervention  Provide education, explanation, and written materials on patient's individual exercise prescription       Expected  Outcomes  Short Term: Able to explain program exercise prescription;Long Term: Able to explain home exercise prescription to exercise independently          Exercise Goals Re-Evaluation : Exercise Goals Re-Evaluation    Row Name 10/30/18 1722 11/09/18 0834 11/14/18 1015 11/29/18 1318 12/13/18 1519     Exercise Goal Re-Evaluation   Exercise Goals Review  Increase Physical Activity;Increase Strength and Stamina;Able to  understand and use rate of perceived exertion (RPE) scale;Knowledge and understanding of Target Heart Rate Range (THRR);Understanding of Exercise Prescription  Increase Physical Activity;Increase Strength and Stamina;Able to understand and use rate of perceived exertion (RPE) scale;Knowledge and understanding of Target Heart Rate Range (THRR);Able to check pulse independently;Understanding of Exercise Prescription  Increase Physical Activity;Increase Strength and Stamina;Able to understand and use rate of perceived exertion (RPE) scale;Knowledge and understanding of Target Heart Rate Range (THRR);Understanding of Exercise Prescription  Increase Physical Activity;Increase Strength and Stamina;Able to understand and use rate of perceived exertion (RPE) scale;Knowledge and understanding of Target Heart Rate Range (THRR);Understanding of Exercise Prescription  Increase Physical Activity;Increase Strength and Stamina;Able to understand and use rate of perceived exertion (RPE) scale;Able to understand and use Dyspnea scale;Knowledge and understanding of Target Heart Rate Range (THRR);Understanding of Exercise Prescription   Comments  Reviewed RPE scale, THRR, and initial exercise prescription today with patient. Patient voiced understanding of these topics and tolerated first day of exercise well.   Reviewed home exercise with pt today.  Pt plans to continue to go to [facility] for exercise.  He uses the treadmills and machines when he is there.  Reviewed THR, pulse, RPE, sign and symptoms, NTG use, and when to call 911 or MD.  Also discussed weather considerations and indoor options.  Pt voiced understanding.  Daniel Mendez attends consistently and works in correct THR and RPE range.  Daniel Mendez is progressing well and has increased overall MET level.    Daniel Mendez attends consistently and has increased to average 4 METS.  He has increased levels on XR and RB.   Expected Outcomes  Short: use RPE daily to determine activity  intensity. Long: Understand and follow exercise presciption.   Short: Continue to go to [facility] for exercise.  Long: Continue to increase activity levels.   Short - continue to attend consistently Long - increase MET level  Short - increase weight strength training Long - continue to improve MET level  Short - increase speed and grade on TM Long - increase overall MET level      Discharge Exercise Prescription (Final Exercise Prescription Changes): Exercise Prescription Changes - 12/13/18 1500      Response to Exercise   Blood Pressure (Admit)  140/70    Blood Pressure (Exit)  122/64    Heart Rate (Admit)  65 bpm    Heart Rate (Exercise)  99 bpm    Heart Rate (Exit)  63 bpm    Rating of Perceived Exertion (Exercise)  14    Symptoms  none    Duration  Continue with 45 min of aerobic exercise without signs/symptoms of physical distress.    Intensity  THRR unchanged      Progression   Progression  Continue to progress workloads to maintain intensity without signs/symptoms of physical distress.    Average METs  4      Resistance Training   Training Prescription  Yes    Weight  3 lb    Reps  10-15      Interval Training  Interval Training  No      Treadmill   MPH  2.5    Grade  2    Minutes  15    METs  3.6      Recumbant Bike   Level  7    RPM  60    Watts  39    Minutes  15    METs  3.4      REL-XR   Level  7    Speed  50    Minutes  15    METs  5      Home Exercise Plan   Plans to continue exercise at  [employer] (comment)   [facility]   Frequency  Add 2 additional days to program exercise sessions.    Initial Home Exercises Provided  11/09/18       Nutrition:  Target Goals: Understanding of nutrition guidelines, daily intake of sodium <1560m, cholesterol <2068m calories 30% from fat and 7% or less from saturated fats, daily to have 5 or more servings of fruits and vegetables.  Biometrics: Pre Biometrics - 10/26/18 1437      Pre  Biometrics   Height  5' 10.5" (1.791 m)    Weight  178 lb 9.6 oz (81 kg)    Waist Circumference  37 inches    Hip Circumference  40 inches    Waist to Hip Ratio  0.92 %    BMI (Calculated)  25.26    Single Leg Stand  28 seconds      Post Biometrics - 12/14/18 1715       Post  Biometrics   Height  5' 10.5" (1.791 m)    Weight  181 lb (82.1 kg)    Waist Circumference  39 inches    Hip Circumference  39 inches    Waist to Hip Ratio  1 %    BMI (Calculated)  25.6    Single Leg Stand  12.21 seconds       Nutrition Therapy Plan and Nutrition Goals: Nutrition Therapy & Goals - 11/20/18 1704      Nutrition Therapy   Diet  DM    Drug/Food Interactions  Statins/Certain Fruits    Protein (specify units)  11oz    Fiber  35 grams    Whole Grain Foods  3 servings   eats oatmeal regularly and bread is often whole grain   Saturated Fats  14 max. grams    Fruits and Vegetables  5 servings/day   8 ideal   Sodium  1500 grams      Personal Nutrition Goals   Nutrition Goal  Drink at least one additional glass of fluids per day, ideally unsweetened    Personal Goal #2  Now that you have a new BG meter, experiment with type/frequency of HS snacks and BG readings upon waking in the morning. Try to identify patterns that may exist    Comments  S/p surgery pt reports BG readings to be abnormal and K+ to be elevated. He was given a new BG meter and is starting to see improvements in BG readings and has gone from 3 bananas / day to 0-1 / day to decrease K+ intake. HgbA1c 7.1 two weeks ago per his report, improved from previous reading. Breakfast: oatmeal with walnuts and fruit, Lunch: soup with vegetables and chipped beef, sandwich, Dinner: baked potato, green beans, broccoli, chicken, fish, seafood, coleslaw, spaghetti and meatballs & other [place]. If they eat out  he tries to choose at least one vegetable as a side. He and his wife cook using olive oil and do not deep fry foods. Snacks: fruit,  low sugar ice cream with nuts/ fudge/ amaretto occasionally, SF candy. Beverages: flavored water, diet soda, some water. He eats canned vegetables mostly in the winter but does rinse them off with water before eating.       Intervention Plan   Intervention  Prescribe, educate and counsel regarding individualized specific dietary modifications aiming towards targeted core components such as weight, hypertension, lipid management, diabetes, heart failure and other comorbidities.    Expected Outcomes  Short Term Goal: Understand basic principles of dietary content, such as calories, fat, sodium, cholesterol and nutrients.;Long Term Goal: Adherence to prescribed nutrition plan.;Short Term Goal: A plan has been developed with personal nutrition goals set during dietitian appointment.       Nutrition Assessments: Nutrition Assessments - 12/18/18 1804      MEDFICTS Scores   Post Score  25       Nutrition Goals Re-Evaluation: Nutrition Goals Re-Evaluation    [place] Name 11/20/18 1713             Goals   Nutrition Goal  Drink at least one additional glass of fluids per day, ideally unsweetened       Comment  Pt does not think that he drinks adequate fluids daily and today in class was having some irregular heart beats during exercise. He does not drink much water; prefers flavored water or diet soda. He sometimes keeps a bottle of water in his vehicle       Expected Outcome  He will drink at least one additional 8oz glass of fluids (ideally water) each day. Keep a water bottle handy IE in the car         Personal Goal #2 Re-Evaluation   Personal Goal #2  Now that you have a new BG meter, experiment with type/frequency of HS snacks and BG readings upon waking in the morning. Try to identify patterns that may exist          Nutrition Goals Discharge (Final Nutrition Goals Re-Evaluation): Nutrition Goals Re-Evaluation - 11/20/18 1713      Goals   Nutrition Goal  Drink at least one additional  glass of fluids per day, ideally unsweetened    Comment  Pt does not think that he drinks adequate fluids daily and today in class was having some irregular heart beats during exercise. He does not drink much water; prefers flavored water or diet soda. He sometimes keeps a bottle of water in his vehicle    Expected Outcome  He will drink at least one additional 8oz glass of fluids (ideally water) each day. Keep a water bottle handy IE in the car      Personal Goal #2 Re-Evaluation   Personal Goal #2  Now that you have a new BG meter, experiment with type/frequency of HS snacks and BG readings upon waking in the morning. Try to identify patterns that may exist       Psychosocial: Target Goals: Acknowledge presence or absence of significant depression and/or stress, maximize coping skills, provide positive support system. Participant is able to verbalize types and ability to use techniques and skills needed for reducing stress and depression.   Initial Review & Psychosocial Screening: Initial Psych Review & Screening - 10/26/18 1347      Initial Review   Current issues with  Current Stress Concerns  Source of Stress Concerns  Unable to perform yard/household activities    Comments  Leland's biggest stressor currently is trying to get his blood sugar controlled. He and his wife think it is a medication issue and are trying to figure it out. 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       Family Dynamics   Good Support System?  Yes   wife      Barriers   Psychosocial barriers to participate in program  There are no identifiable barriers or psychosocial needs.;The patient should benefit from training in stress management and relaxation.      Screening Interventions   Interventions  Encouraged to exercise;Program counselor consult;Provide feedback about the scores to participant;To provide support and resources with  identified psychosocial needs    Expected Outcomes  Short Term goal: Utilizing psychosocial counselor, staff and physician to assist with identification of specific Stressors or current issues interfering with healing process. Setting desired goal for each stressor or current issue identified.;Long Term Goal: Stressors or current issues are controlled or eliminated.;Short Term goal: Identification and review with participant of any Quality of Life or Depression concerns found by scoring the questionnaire.;Long Term goal: The participant improves quality of Life and PHQ9 Scores as seen by post scores and/or verbalization of changes       Quality of Life Scores:  Quality of Life - 12/18/18 1801      Quality of Life Scores   Health/Function Post  22.6 %    Socioeconomic Post  24.86 %    Psych/Spiritual Post  24 %    Family Post  24 %    GLOBAL Post  23.53 %   "Daniel Mendez" said he is seeing a urologist for ED problems so his Quality of life scores where a down l     Scores of 19 and below usually indicate a poorer quality of life in these areas.  A difference of  2-3 points is a clinically meaningful difference.  A difference of 2-3 points in the total score of the Quality of Life Index has been associated with significant improvement in overall quality of life, self-image, physical symptoms, and general health in studies assessing change in quality of life.  PHQ-9: Recent Review Flowsheet Data    Depression screen [facility] 2/9 12/18/2018 10/26/2018 03/30/2016 11/05/2015   Decreased Interest 0 0 0 0   Down, Depressed, Hopeless 0 0 0 0   PHQ - 2 Score 0 0 0 0   Altered sleeping 1 0 - -   Tired, decreased energy 1 1 - -   Change in appetite 1 0 - -   Feeling bad or failure about yourself  0 0 - -   Trouble concentrating 0 0 - -   Moving slowly or fidgety/restless 0 0 - -   Suicidal thoughts 0 0 - -   PHQ-9 Score 3 1 - -   Difficult doing work/chores Not difficult at all Not difficult at all - -      Interpretation of Total Score  Total Score Depression Severity:  1-4 = Minimal depression, 5-9 = Mild depression, 10-14 = Moderate depression, 15-19 = Moderately severe depression, 20-27 = Severe depression   Psychosocial Evaluation and Intervention: Psychosocial Evaluation - 11/06/18 1719      Psychosocial Evaluation & Interventions   Interventions  Stress management education;Encouraged to exercise with the program and follow exercise prescription;Relaxation  education    Comments  Counselor met with Mr. Ramson Daniel Mendez) today for initial psychosocial evaluation.  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  He has a strong support system with a spouse of 2 years; a daughter locally and a son in [place].  Daniel Mendez reports having diabetes and low testosterone on top of his cardiac condition.  He sleeps well and has a good appetite.  He denies a history of depression or anxiety or any current symptoms.  Daniel Mendez states he is typically in a positive mood most of the time.  He has multiple stressors currently with a daughter who has battling with breast/ovarian cancer and having a mastectomy  later this week.  He is also concerned for his own health and that of his spouse who is a fall risk with multiple orthopaedic health issues.  Daniel Mendez has goals to increase his energy; stamina and strength while in this program and to breathe better.  Staff will follow with him.     Expected Outcomes  Short:  Daniel Mendez will exercise for his health and to reduce his stress with all that is going on in his life currently.  Long:  Daniel Mendez will develop a routine of stress management practices - including exercise - for his health and his mental health.     Continue Psychosocial Services   Follow up required by staff       Psychosocial Re-Evaluation: Psychosocial Re-Evaluation    [place] Name 11/30/18 1558 12/04/18 1318 12/06/18 1710 12/18/18 1803       Psychosocial Re-Evaluation    Current issues with  Current Stress Concerns  -  -  Current Stress Concerns    Comments  -  -  Counselor follow up reporting is noticing some improvement in his stamina and strength since coming into this program.  He is able to get up the two flights of stairs at home more easily - but still would like it to be better.  His energy levels have improved and he continues to sleep well and be in a positive mood.  He continues to have stress in his life with his spouse and daughter's health, but he is coping well and maintains a positive attitude.  Counselor commended Mendez on his progress and he admits his biggest issue is the "need to be more patient with this process."  He will continue to be followed.  -    Expected Outcomes  -  -  Short:  Daniel Mendez will continue to exercise consistently in order to increase his stamina to tackle those flights of stairs with greater ease.  Long:  Daniel Mendez will maintain a positive attitude and become more patient with his recovery and expectations for progress.    "Daniel Mendez" said he is seeing a urologist for ED problems so his Quality of life scores where a down. Daniel Mendez said he knows it might be from his diabetes and hopes the urologist will help him.     Interventions  -  Stress management education  -  -    Continue Psychosocial Services   -  -  Follow up required by staff  -    Comments  -  Daniel Mendez said the doctor told him blood sugars usually are elevated after surgery and now it is better as 150 fasting. Daniel Mendez is back to work but limited due to lifting precautions per his MD still.   -  -  Psychosocial Discharge (Final Psychosocial Re-Evaluation): Psychosocial Re-Evaluation - 12/18/18 1803      Psychosocial Re-Evaluation   Current issues with  Current Stress Concerns    Expected Outcomes  "Daniel Mendez" said he is seeing a urologist for ED problems so his Quality of life scores where a down. Daniel Mendez said he knows it might be from his diabetes and hopes the urologist will  help him.        Vocational Rehabilitation: Provide vocational rehab assistance to qualifying candidates.   Vocational Rehab Evaluation & Intervention: Vocational Rehab - 10/26/18 1347      Initial Vocational Rehab Evaluation & Intervention   Assessment shows need for Vocational Rehabilitation  No       Education: Education Goals: Education classes will be provided on a variety of topics geared toward better understanding of heart health and risk factor modification. Participant will state understanding/return demonstration of topics presented as noted by education test scores.  Learning Barriers/Preferences: Learning Barriers/Preferences - 10/26/18 1342      Learning Barriers/Preferences   Learning Barriers  None    Learning Preferences  None       Education Topics:  AED/CPR: - Group verbal and written instruction with the use of models to demonstrate the basic use of the AED with the basic ABC's of resuscitation.   General Nutrition Guidelines/Fats and Fiber: -Group instruction provided by verbal, written material, models and posters to present the general guidelines for heart healthy nutrition. Gives an explanation and review of dietary fats and fiber.   Cardiac Rehab from 12/18/2018 in [facility] Cardiac and Pulmonary Rehab  Date  12/04/18  Educator  LB  Instruction Review Code  1- Verbalizes Understanding      Controlling Sodium/Reading Food Labels: -Group verbal and written material supporting the discussion of sodium use in heart healthy nutrition. Review and explanation with models, verbal and written materials for utilization of the food label.   Cardiac Rehab from 12/18/2018 in [facility] Cardiac and Pulmonary Rehab  Date  12/06/18  Educator  LB  Instruction Review Code  1- Verbalizes Understanding      Exercise Physiology & General Exercise Guidelines: - Group verbal and written instruction with models to review the exercise physiology of the cardiovascular system and  associated critical values. Provides general exercise guidelines with specific guidelines to those with heart or lung disease.    Cardiac Rehab from 12/18/2018 in [facility] Cardiac and Pulmonary Rehab  Date  12/11/18  Educator  [facility]  Instruction Review Code  1- Verbalizes Understanding      Aerobic Exercise & Resistance Training: - Gives group verbal and written instruction on the various components of exercise. Focuses on aerobic and resistive training programs and the benefits of this training and how to safely progress through these programs..   Cardiac Rehab from 12/18/2018 in [facility] Cardiac and Pulmonary Rehab  Date  12/13/18  Educator  AS  Instruction Review Code  1- Verbalizes Understanding      Flexibility, Balance, Mind/Body Relaxation: Provides group verbal/written instruction on the benefits of flexibility and balance training, including mind/body exercise modes such as yoga, pilates and tai chi.  Demonstration and skill practice provided.   Cardiac Rehab from 12/18/2018 in [facility] Cardiac and Pulmonary Rehab  Date  12/18/18  Educator  AS  Instruction Review Code  1- Verbalizes Understanding      Stress and Anxiety: - Provides group verbal and written instruction about the health risks of elevated stress and causes of high stress.  Discuss the  correlation between heart/lung disease and anxiety and treatment options. Review healthy ways to manage with stress and anxiety.   Depression: - Provides group verbal and written instruction on the correlation between heart/lung disease and depressed mood, treatment options, and the stigmas associated with seeking treatment.   Cardiac Rehab from 12/18/2018 in [facility] Cardiac and Pulmonary Rehab  Date  11/29/18  Educator  Lucianne Lei, MSW  Instruction Review Code  2- Demonstrated Understanding      Anatomy & Physiology of the Heart: - Group verbal and written instruction and models provide basic cardiac anatomy and physiology, with the coronary  electrical and arterial systems. Review of Valvular disease and Heart Failure   Cardiac Rehab from 12/18/2018 in [facility] Cardiac and Pulmonary Rehab  Date  10/30/18  Educator  CE  Instruction Review Code  1- Verbalizes Understanding      Cardiac Procedures: - Group verbal and written instruction to review commonly prescribed medications for heart disease. Reviews the medication, class of the drug, and side effects. Includes the steps to properly store meds and maintain the prescription regimen. (beta blockers and nitrates)   Cardiac Medications I: - Group verbal and written instruction to review commonly prescribed medications for heart disease. Reviews the medication, class of the drug, and side effects. Includes the steps to properly store meds and maintain the prescription regimen.   Cardiac Rehab from 12/18/2018 in [facility] Cardiac and Pulmonary Rehab  Date  11/06/18  Educator  CE  Instruction Review Code  1- Verbalizes Understanding      Cardiac Medications II: -Group verbal and written instruction to review commonly prescribed medications for heart disease. Reviews the medication, class of the drug, and side effects. (all other drug classes)    Go Sex-Intimacy & Heart Disease, Get SMART - Goal Setting: - Group verbal and written instruction through game format to discuss heart disease and the return to sexual intimacy. Provides group verbal and written material to discuss and apply goal setting through the application of the S.M.A.R.T. Method.   Other Matters of the Heart: - Provides group verbal, written materials and models to describe Stable Angina and Peripheral Artery. Includes description of the disease process and treatment options available to the cardiac patient.   Cardiac Rehab from 12/18/2018 in [facility] Cardiac and Pulmonary Rehab  Date  10/30/18  Educator  CE  Instruction Review Code  1- Verbalizes Understanding      Exercise & Equipment Safety: - Individual verbal  instruction and demonstration of equipment use and safety with use of the equipment.   Cardiac Rehab from 12/18/2018 in [facility] Cardiac and Pulmonary Rehab  Date  10/26/18  Educator  [facility]  Instruction Review Code  1- Verbalizes Understanding      Infection Prevention: - Provides verbal and written material to individual with discussion of infection control including proper hand washing and proper equipment cleaning during exercise session.   Cardiac Rehab from 12/18/2018 in [facility] Cardiac and Pulmonary Rehab  Date  10/26/18  Educator  [facility]  Instruction Review Code  1- Verbalizes Understanding      Falls Prevention: - Provides verbal and written material to individual with discussion of falls prevention and safety.   Cardiac Rehab from 12/18/2018 in [id number] Cardiac and Pulmonary Rehab  Date  10/26/18  Educator  [facility]  Instruction Review Code  1- Verbalizes Understanding      Diabetes: - Individual verbal and written instruction to review signs/symptoms of diabetes, desired ranges of glucose level fasting, after meals and with  exercise. Acknowledge that pre and post exercise glucose checks will be done for 3 sessions at entry of program.   Cardiac Rehab from 12/18/2018 in [facility] Cardiac and Pulmonary Rehab  Date  10/26/18  Educator  [facility]  Instruction Review Code  1- Verbalizes Understanding      Know Your Numbers and Risk Factors: -Group verbal and written instruction about important numbers in your health.  Discussion of what are risk factors and how they play a role in the disease process.  Review of Cholesterol, Blood Pressure, Diabetes, and BMI and the role they play in your overall health.   Sleep Hygiene: -Provides group verbal and written instruction about how sleep can affect your health.  Define sleep hygiene, discuss sleep cycles and impact of sleep habits. Review good sleep hygiene tips.    Other: -Provides group and verbal instruction on various topics (see comments)   Knowledge  Questionnaire Score: Knowledge Questionnaire Score - 12/18/18 1804      Knowledge Questionnaire Score   Post Score  25       Core Components/Risk Factors/Patient Goals at Admission: Personal Goals and Risk Factors at Admission - 10/26/18 1341      Core Components/Risk Factors/Patient Goals on Admission    Weight Management  Yes;Weight Maintenance    Intervention  Weight Management: Develop a combined nutrition and exercise program designed to reach desired caloric intake, while maintaining appropriate intake of nutrient and fiber, sodium and fats, and appropriate energy expenditure required for the weight goal.;Weight Management: Provide education and appropriate resources to help participant work on and attain dietary goals.    Expected Outcomes  Short Term: Continue to assess and modify interventions until short term weight is achieved;Long Term: Adherence to nutrition and physical activity/exercise program aimed toward attainment of established weight goal;Weight Maintenance: Understanding of the daily nutrition guidelines, which includes 25-35% calories from fat, 7% or less cal from saturated fats, less than 242m cholesterol, less than 1.5gm of sodium, & 5 or more servings of fruits and vegetables daily;Understanding recommendations for meals to include 15-35% energy as protein, 25-35% energy from fat, 35-60% energy from carbohydrates, less than 2036mof dietary cholesterol, 20-35 gm of total fiber daily;Understanding of distribution of calorie intake throughout the day with the consumption of 4-5 meals/snacks    Diabetes  Yes    Intervention  Provide education about signs/symptoms and action to take for hypo/hyperglycemia.;Provide education about proper nutrition, including hydration, and aerobic/resistive exercise prescription along with prescribed medications to achieve blood glucose in normal ranges: Fasting glucose 65-99 mg/dL    Expected Outcomes  Short Term: Participant verbalizes  understanding of the signs/symptoms and immediate care of hyper/hypoglycemia, proper foot care and importance of medication, aerobic/resistive exercise and nutrition plan for blood glucose control.;Long Term: Attainment of HbA1C < 7%.    Hypertension  Yes    Intervention  Provide education on lifestyle modifcations including regular physical activity/exercise, weight management, moderate sodium restriction and increased consumption of fresh fruit, vegetables, and low fat dairy, alcohol moderation, and smoking cessation.;Monitor prescription use compliance.    Expected Outcomes  Short Term: Continued assessment and intervention until BP is < 140/9063mG in hypertensive participants. < 130/78m19m in hypertensive participants with diabetes, heart failure or chronic kidney disease.;Long Term: Maintenance of blood pressure at goal levels.    Lipids  Yes    Intervention  Provide education and support for participant on nutrition & aerobic/resistive exercise along with prescribed medications to achieve LDL <70mg58mL >40mg.45m  Expected Outcomes  Short Term: Participant states understanding of desired cholesterol values and is compliant with medications prescribed. Participant is following exercise prescription and nutrition guidelines.;Long Term: Cholesterol controlled with medications as prescribed, with individualized exercise RX and with personalized nutrition plan. Value goals: LDL < 21m, HDL > 40 mg.       Core Components/Risk Factors/Patient Goals Review:  Goals and Risk Factor Review    Row Name 11/30/18 1553 12/04/18 1314 12/04/18 1319         Core Components/Risk Factors/Patient Goals Review   Personal Goals Review  Weight Management/Obesity;Diabetes;Improve shortness of breath with ADL's;Hypertension;Lipids  Weight Management/Obesity;Improve shortness of breath with ADL's;Hypertension  Weight Management/Obesity     Review  LCasandra Doffingfeels his energy , SOB and stamina have improved since starting  to exercise.  He is exercising once per week outside HT.  He is taking meds as directed.  He takes BG every morning - this week it has been lower - 130-140.  He isn't checking BP at home.    Blood pressure has been doing well at home since got a new blood pressure cuff at home was 1045systolic. Blood sugar in am fasting is 150. Stamina has improved and he is back working part time.   WEight is stable and cont exericse to keep his weight under control.     Expected Outcomes  Short - get BP monitor for home to check Long - continue to monitor BP/BG at home   Cont to monitor blood sugar at home and LCasandra Doffingsaid his doctor wants his systolic at 1997   Cont to keep weight under control.        Core Components/Risk Factors/Patient Goals at Discharge (Final Review):  Goals and Risk Factor Review - 12/04/18 1319      Core Components/Risk Factors/Patient Goals Review   Personal Goals Review  Weight Management/Obesity    Review  WEight is stable and cont exericse to keep his weight under control.    Expected Outcomes  Cont to keep weight under control.       ITP Comments: ITP Comments    Row Name 10/26/18 1337 11/21/18 0649 12/20/18 1401       ITP Comments  Med Review completed. Initial ITP created. Diagnosis can be found in [facility] 10/28  30 Day Review. Continue with ITP unless directed changes per Medical Director review. New to program  30 day review completed. ITP sent to Dr. MEmily Filbert Medical Director of Cardiac Rehab. Continue with ITP unless changes are made by physician.        Comments: 30 day review

## 2018-12-20 NOTE — Progress Notes (Signed)
Daily Session Note  Patient Details  Name: BRANTLEY WILEY MRN: [id number] Date of Birth: [birth year] Referring Provider:     Cardiac Rehab from 10/26/2018 in [facility] Cardiac and Pulmonary Rehab  Referring Provider  [name]      Encounter Date: 12/20/2018  Check In: Session Check In - 12/20/18 1623      Check-In   Supervising physician immediately available to respond to emergencies  See telemetry face sheet for immediately available ER MD    Location  [facility]-Cardiac & Pulmonary Rehab    Staff Present  Renita Papa, RN Vickki Hearing, BA, ACSM CEP, Exercise Physiologist;Carroll Enterkin, RN, BSN    Medication changes reported      No    Fall or balance concerns reported     No    Tobacco Cessation  No Change    Warm-up and Cool-down  Performed as group-led instruction    Resistance Training Performed  Yes    VAD Patient?  No    PAD/SET Patient?  No      Pain Assessment   Currently in Pain?  No/denies          Social History   Tobacco Use  Smoking Status Never Smoker  Smokeless Tobacco Never Used    Goals Met:  Independence with exercise equipment Exercise tolerated well No report of cardiac concerns or symptoms Strength training completed today  Goals Unmet:  Not Applicable  Comments: Pt able to follow exercise prescription today without complaint.  Will continue to monitor for progression.    Dr. Emily Filbert is Medical Director for [place] and [facility].

## 2018-12-21 DIAGNOSIS — Z952 Presence of prosthetic heart valve: Secondary | ICD-10-CM

## 2018-12-21 DIAGNOSIS — Z951 Presence of aortocoronary bypass graft: Secondary | ICD-10-CM | POA: Diagnosis not present

## 2018-12-21 NOTE — Progress Notes (Signed)
Daily Session Note  Patient Details  Name: Daniel Mendez MRN: [id number] Date of Birth: 28-Sep-1941 Referring Provider:     Cardiac Rehab from 10/26/2018 in [facility] Cardiac and Pulmonary Rehab  Referring Provider  [name]      Encounter Date: 12/21/2018  Check In:      Social History   Tobacco Use  Smoking Status Never Smoker  Smokeless Tobacco Never Used    Goals Met:  Independence with exercise equipment Exercise tolerated well No report of cardiac concerns or symptoms Strength training completed today  Goals Unmet:  Not Applicable  Comments: Pt able to follow exercise prescription today without complaint.  Will continue to monitor for progression.  Daniel Mendez graduated today from  rehab with 36 sessions completed.  Details of the patient's exercise prescription and what He needs to do in order to continue the prescription and progress were discussed with patient.  Patient was given a copy of prescription and goals.  Patient verbalized understanding.  Daniel Mendez plans to continue to exercise by using exercise equipment st the [facility].   Dr. Emily Filbert is Medical Director for [place] and [facility].

## 2018-12-21 NOTE — Progress Notes (Signed)
Cardiac Individual Treatment Plan  Patient Details  Name: Daniel Mendez MRN: [id number] Date of Birth: 01/26/1941 Referring Provider:     Cardiac Rehab from 10/26/2018 in [facility] Cardiac and Pulmonary Rehab  Referring Provider  [name]      Initial Encounter Date:    Cardiac Rehab from 10/26/2018 in [facility] Cardiac and Pulmonary Rehab  Date  10/26/18      Visit Diagnosis: S/P CABG x 1  S/P TAVR (transcatheter aortic valve replacement)  Patient's Home Medications on Admission:  Current Outpatient Medications:  .  acetaminophen (TYLENOL) 325 MG tablet, Take 2 tablets (650 mg total) by mouth every 6 (six) hours as needed for mild pain., Disp: , Rfl:  .  amLODipine (NORVASC) 5 MG tablet, Take 1 tablet (5 mg total) by mouth daily., Disp: 90 tablet, Rfl: 3 .  aspirin 325 MG EC tablet, Take 1 tablet (325 mg total) by mouth daily., Disp: 90 tablet, Rfl: 0 .  atorvastatin (LIPITOR) 20 MG tablet, Take 20 mg by mouth daily at 12 noon. , Disp: , Rfl:  .  cyclobenzaprine (FLEXERIL) 5 MG tablet, Take 2.5 mg by mouth daily as needed for muscle spasms. , Disp: , Rfl:  .  glimepiride (AMARYL) 4 MG tablet, Take 4 mg by mouth daily with breakfast., Disp: , Rfl:  .  metFORMIN (GLUCOPHAGE) 500 MG tablet, Take 1,000 mg by mouth 2 (two) times daily. , Disp: , Rfl:  .  metoprolol succinate (TOPROL-XL) 25 MG 24 hr tablet, Take 0.5 tablets (12.5 mg total) by mouth daily., Disp: 45 tablet, Rfl: 3 .  Omega-3 Fatty Acids (FISH OIL) 1000 MG CAPS, Take 1,000 mg by mouth 2 (two) times daily. , Disp: , Rfl:  .  omeprazole (PRILOSEC) 20 MG capsule, Take 20 mg by mouth every other day. At noon, Disp: , Rfl:  .  ONE TOUCH ULTRA TEST test strip, 1 each by Other route daily. , Disp: , Rfl:  .  ONETOUCH DELICA LANCETS 96P MISC, 1 each by Other route daily. , Disp: , Rfl:  .  sitaGLIPtin (JANUVIA) 100 MG tablet, Take 100 mg by mouth daily with lunch. , Disp: , Rfl:  .  tamsulosin (FLOMAX) 0.4 MG CAPS capsule, Take 0.4 mg by  mouth every evening., Disp: , Rfl:  .  telmisartan (MICARDIS) 40 MG tablet, Take 20 mg by mouth daily., Disp: , Rfl:  .  testosterone cypionate (DEPOTESTOSTERONE CYPIONATE) 200 MG/ML injection, Inject 200 mg into the muscle every 14 (fourteen) days., Disp: , Rfl:  .  traMADol (ULTRAM) 50 MG tablet, Take 50 mg by mouth every 4-6 hours PRN severe pain, Disp: 28 tablet, Rfl: 0  Past Medical History: Past Medical History:  Diagnosis Date  . Anginal pain ([place])   . Arthritis    hand- right  . Coronary artery disease   . Diabetes ([place])    type 2  . GERD (gastroesophageal reflux disease)   . Heart murmur    discovered in 2015 - followed by Dr. Daneen Schick  . History of hiatal hernia   . History of kidney stones   . HTN (hypertension)   . Hypercalcemia   . Hyperglyceridemia   . Hyperlipidemia   . Mild aortic stenosis Oct 2014  . Peripheral vascular disease ([place])   . Pneumonia 07/28/2018  . Shortness of breath dyspnea    on exertion  . Sleep apnea    no cpap    Tobacco Use: Social History   Tobacco Use  Smoking  Status Never Smoker  Smokeless Tobacco Never Used    Labs: Recent Review Flowsheet Data    Labs for ITP Cardiac and Pulmonary Rehab Latest Ref Rng & Units 09/18/2018 09/18/2018 09/18/2018 09/18/2018 09/19/2018   Hemoglobin A1c 4.8 - 5.6 % - - - - -   PHART 7.350 - 7.450 7.358 7.348(L) 7.323(L) - -   PCO2ART 32.0 - 48.0 mmHg 43.6 40.2 45.2 - -   HCO3 20.0 - 28.0 mmol/L 24.8 22.1 23.4 - -   TCO2 22 - 32 mmol/L _0 ACIDBASEDEF 0.0 - 2.0 mmol/L 1.0 3.0(H) 3.0(H) - -   O2SAT % 99.0 96.0 94.0 - -       Exercise Target Goals: Exercise Program Goal: Individual exercise prescription set using results from initial 6 min walk test and THRR while considering  patient's activity barriers and safety.   Exercise Prescription Goal: Initial exercise prescription builds to 30-45 minutes a day of aerobic activity, 2-3 days per week.  Home exercise guidelines will be  given to patient during program as part of exercise prescription that the participant will acknowledge.  Activity Barriers & Risk Stratification: Activity Barriers & Cardiac Risk Stratification - 10/26/18 1424      Activity Barriers & Cardiac Risk Stratification   Activity Barriers  Joint Problems;Shortness of Breath    Cardiac Risk Stratification  Moderate       6 Minute Walk: 6 Minute Walk    Row Name 10/26/18 1438 12/14/18 1705       6 Minute Walk   Phase  Initial  Discharge    Distance  1342 feet  1660 feet    Distance % Change  -  24 %    Distance Feet Change  -  318 ft    Walk Time  6 minutes  6 minutes    # of Rest Breaks  0  0    MPH  2.54  3.14    METS  3.2  3.75    RPE  11  15    Perceived Dyspnea   0  1    VO2 Peak  11.17  13.12    Symptoms  No  No    Resting HR  71 bpm  62 bpm    Resting BP  148/60  132/80    Resting Oxygen Saturation   97 %  93 %    Max Ex. HR  105 bpm  107 bpm    Max Ex. BP  180/66  190/82    2 Minute Post BP  144/68  170/72       Oxygen Initial Assessment:   Oxygen Re-Evaluation:   Oxygen Discharge (Final Oxygen Re-Evaluation):   Initial Exercise Prescription: Initial Exercise Prescription - 10/26/18 1400      Date of Initial Exercise RX and Referring Provider   Date  10/26/18    Referring Provider  [name]      Treadmill   MPH  2.5    Grade  0.5    Minutes  15    METs  3.09      Recumbant Bike   Level  3    RPM  60    Watts  32    Minutes  15    METs  3      REL-XR   Level  3    Speed  50    Minutes  15    METs  3  Prescription Details   Frequency (times per week)  3    Duration  Progress to 45 minutes of aerobic exercise without signs/symptoms of physical distress      Intensity   THRR 40-80% of Max Heartrate  99-127    Ratings of Perceived Exertion  11-13    Perceived Dyspnea  0-4      Resistance Training   Training Prescription  Yes    Weight  3 lb    Reps  10-15       Perform Capillary  Blood Glucose checks as needed.  Exercise Prescription Changes: Exercise Prescription Changes    Row Name 10/26/18 1400 11/01/18 1100 11/09/18 0800 11/14/18 1000 11/29/18 1300     Response to Exercise   Blood Pressure (Admit)  148/60  160/70  -  122/80  144/60   Blood Pressure (Exercise)  180/66  142/80  -  148/52  158/60   Blood Pressure (Exit)  144/68  104/60  -  110/64  104/60   Heart Rate (Admit)  64 bpm  62 bpm  -  63 bpm  74 bpm   Heart Rate (Exercise)  105 bpm  106 bpm  -  101 bpm  119 bpm   Heart Rate (Exit)  68 bpm  84 bpm  -  69 bpm  85 bpm   Oxygen Saturation (Admit)  97 %  -  -  -  -   Rating of Perceived Exertion (Exercise)  11  13  -  12  14   Symptoms  -  -  -  none  none   Comments  -  first day  -  -  -   Duration  -  Progress to 45 minutes of aerobic exercise without signs/symptoms of physical distress  -  Progress to 45 minutes of aerobic exercise without signs/symptoms of physical distress  Progress to 45 minutes of aerobic exercise without signs/symptoms of physical distress   Intensity  -  THRR unchanged  -  THRR unchanged  THRR unchanged     Progression   Progression  -  Continue to progress workloads to maintain intensity without signs/symptoms of physical distress.  -  Continue to progress workloads to maintain intensity without signs/symptoms of physical distress.  Continue to progress workloads to maintain intensity without signs/symptoms of physical distress.   Average METs  -  2.8  -  -  3.9     Resistance Training   Training Prescription  -  Yes  -  Yes  Yes   Weight  -  3 lb  -  3 lb  3 lb   Reps  -  10-15  -  10-15  10-15     Interval Training   Interval Training  -  No  -  No  No     Treadmill   MPH  -  2.2  -  2.5  2.5   Grade  -  0.5  -  2  2   Minutes  -  15  -  15  15   METs  -  2.84  -  3.6  3.6     Recumbant Bike   Level  -  -  -  6  6   RPM  -  -  -  60  60   Watts  -  -  -  36  32   Minutes  -  -  -  15  15  METs  -  -  -  3.4  3.35      REL-XR   Level  -  3  -  -  6   Speed  -  50  -  -  50   Minutes  -  15  -  -  15   METs  -  2.8  -  -  5     Home Exercise Plan   Plans to continue exercise at  -  -  [employer] (comment) [place] (comment) [place] (comment) [place] 2 additional days to program exercise sessions.  Add 2 additional days to program exercise sessions.  Add 2 additional days to program exercise sessions.   Initial Home Exercises Provided  -  -  11/09/18  11/09/18  11/09/18   Row Name 12/13/18 1500             Response to Exercise   Blood Pressure (Admit)  140/70       Blood Pressure (Exit)  122/64       Heart Rate (Admit)  65 bpm       Heart Rate (Exercise)  99 bpm       Heart Rate (Exit)  63 bpm       Rating of Perceived Exertion (Exercise)  14       Symptoms  none       Duration  Continue with 45 min of aerobic exercise without signs/symptoms of physical distress.       Intensity  THRR unchanged         Progression   Progression  Continue to progress workloads to maintain intensity without signs/symptoms of physical distress.       Average METs  4         Resistance Training   Training Prescription  Yes       Weight  3 lb       Reps  10-15         Interval Training   Interval Training  No         Treadmill   MPH  2.5       Grade  2       Minutes  15       METs  3.6         Recumbant Bike   Level  7       RPM  60       Watts  39       Minutes  15       METs  3.4         REL-XR   Level  7       Speed  50       Minutes  15       METs  5         Home Exercise Plan   Plans to continue exercise at  [employer] (comment) [facility]       Frequency  Add 2 additional days to program exercise sessions.       Initial Home Exercises Provided  11/09/18          Exercise Comments: Exercise Comments    Row Name 10/30/18 1722 11/30/18 1604 12/21/18 1749        Exercise Comments  First full day of exercise!  Patient was oriented  to gym and equipment including functions, settings, policies, and procedures.  Patient's individual exercise prescription and treatment plan were reviewed.  All starting workloads were established based on the results of the 6 minute walk test done at initial orientation visit.  The plan for exercise progression was also introduced and progression will be customized based on patient's performance and goals.  Reviewed METs and progress today.  TM is hard "legs weak and out of breath".  Advised him to stay at that level until it becomes 11-12 RPE.   [place] graduated today from  rehab with 36 sessions completed.  Details of the patient's exercise prescription and what He needs to do in order to continue the prescription and progress were discussed with patient.  Patient was given a copy of prescription and goals.  Patient verbalized understanding.  [place] plans to continue to exercise by using exercise equipment st the [facility].        Exercise Goals and Review: Exercise Goals    Row Name 10/26/18 1438             Exercise Goals   Increase Physical Activity  Yes       Intervention  Provide advice, education, support and counseling about physical activity/exercise needs.;Develop an individualized exercise prescription for aerobic and resistive training based on initial evaluation findings, risk stratification, comorbidities and participant's personal goals.       Expected Outcomes  Short Term: Attend rehab on a regular basis to increase amount of physical activity.;Long Term: Exercising regularly at least 3-5 days a week.;Long Term: Add in home exercise to make exercise part of routine and to increase amount of physical activity.       Increase Strength and Stamina  Yes       Intervention  Provide advice, education, support and counseling about physical activity/exercise needs.;Develop an individualized exercise prescription  for aerobic and resistive training based on initial evaluation findings, risk stratification, comorbidities and participant's personal goals.       Expected Outcomes  Short Term: Increase workloads from initial exercise prescription for resistance, speed, and METs.;Short Term: Perform resistance training exercises routinely during rehab and add in resistance training at home;Long Term: Improve cardiorespiratory fitness, muscular endurance and strength as measured by increased METs and functional capacity (6MWT)       Able to understand and use rate of perceived exertion (RPE) scale  Yes       Intervention  Provide education and explanation on how to use RPE scale       Expected Outcomes  Short Term: Able to use RPE daily in rehab to express subjective intensity level;Long Term:  Able to use RPE to guide intensity level when exercising independently       Knowledge and understanding of Target Heart Rate Range (THRR)  Yes       Intervention  Provide education and explanation of THRR including how the numbers were predicted and where they are located for reference       Expected Outcomes  Short Term: Able to state/look up THRR;Short Term: Able to use daily as guideline for intensity in rehab;Long Term: Able to use THRR to govern intensity when exercising independently       Able to check pulse independently  Yes       Intervention  Provide education and demonstration on how to check pulse in carotid and radial arteries.;Review the importance of being able to check your own pulse for safety during independent exercise  Expected Outcomes  Short Term: Able to explain why pulse checking is important during independent exercise;Long Term: Able to check pulse independently and accurately       Understanding of Exercise Prescription  Yes       Intervention  Provide education, explanation, and written materials on patient's individual exercise prescription       Expected Outcomes  Short Term: Able to explain  program exercise prescription;Long Term: Able to explain home exercise prescription to exercise independently          Exercise Goals Re-Evaluation : Exercise Goals Re-Evaluation    Row Name 10/30/18 1722 11/09/18 0834 11/14/18 1015 11/29/18 1318 12/13/18 1519     Exercise Goal Re-Evaluation   Exercise Goals Review  Increase Physical Activity;Increase Strength and Stamina;Able to understand and use rate of perceived exertion (RPE) scale;Knowledge and understanding of Target Heart Rate Range (THRR);Understanding of Exercise Prescription  Increase Physical Activity;Increase Strength and Stamina;Able to understand and use rate of perceived exertion (RPE) scale;Knowledge and understanding of Target Heart Rate Range (THRR);Able to check pulse independently;Understanding of Exercise Prescription  Increase Physical Activity;Increase Strength and Stamina;Able to understand and use rate of perceived exertion (RPE) scale;Knowledge and understanding of Target Heart Rate Range (THRR);Understanding of Exercise Prescription  Increase Physical Activity;Increase Strength and Stamina;Able to understand and use rate of perceived exertion (RPE) scale;Knowledge and understanding of Target Heart Rate Range (THRR);Understanding of Exercise Prescription  Increase Physical Activity;Increase Strength and Stamina;Able to understand and use rate of perceived exertion (RPE) scale;Able to understand and use Dyspnea scale;Knowledge and understanding of Target Heart Rate Range (THRR);Understanding of Exercise Prescription   Comments  Reviewed RPE scale, THRR, and initial exercise prescription today with patient. Patient voiced understanding of these topics and tolerated first day of exercise well.   Reviewed home exercise with pt today.  [facility].  He uses the treadmills and machines when he is there.  Reviewed THR, pulse, RPE, sign and symptoms, NTG use, and when to call 911 or MD.   Also discussed weather considerations and indoor options.  Pt voiced understanding.  Daniel Mendez attends consistently and works in correct THR and RPE range.  Daniel Mendez is progressing well and has increased overall MET level.    Daniel Mendez attends consistently and has increased to average 4 METS.  He has increased levels on XR and RB.   Expected Outcomes  Short: use RPE daily to determine activity intensity. Long: Understand and follow exercise presciption.   Short: Continue to go to [facility] for exercise.  Long: Continue to increase activity levels.   Short - continue to attend consistently Long - increase MET level  Short - increase weight strength training Long - continue to improve MET level  Short - increase speed and grade on TM Long - increase overall MET level      Discharge Exercise Prescription (Final Exercise Prescription Changes): Exercise Prescription Changes - 12/13/18 1500      Response to Exercise   Blood Pressure (Admit)  140/70    Blood Pressure (Exit)  122/64    Heart Rate (Admit)  65 bpm    Heart Rate (Exercise)  99 bpm    Heart Rate (Exit)  63 bpm    Rating of Perceived Exertion (Exercise)  14    Symptoms  none    Duration  Continue with 45 min of aerobic exercise without signs/symptoms of physical distress.    Intensity  THRR unchanged  Progression   Progression  Continue to progress workloads to maintain intensity without signs/symptoms of physical distress.    Average METs  4      Resistance Training   Training Prescription  Yes    Weight  3 lb    Reps  10-15      Interval Training   Interval Training  No      Treadmill   MPH  2.5    Grade  2    Minutes  15    METs  3.6      Recumbant Bike   Level  7    RPM  60    Watts  39    Minutes  15    METs  3.4      REL-XR   Level  7    Speed  50    Minutes  15    METs  5      Home Exercise Plan   Plans to continue exercise at  [employer] (comment)   [facility]   Frequency  Add 2  additional days to program exercise sessions.    Initial Home Exercises Provided  11/09/18       Nutrition:  Target Goals: Understanding of nutrition guidelines, daily intake of sodium '1500mg'$ , cholesterol '200mg'$ , calories 30% from fat and 7% or less from saturated fats, daily to have 5 or more servings of fruits and vegetables.  Biometrics: Pre Biometrics - 10/26/18 1437      Pre Biometrics   Height  5' 10.5" (1.791 m)    Weight  178 lb 9.6 oz (81 kg)    Waist Circumference  37 inches    Hip Circumference  40 inches    Waist to Hip Ratio  0.92 %    BMI (Calculated)  25.26    Single Leg Stand  28 seconds      Post Biometrics - 12/14/18 1715       Post  Biometrics   Height  5' 10.5" (1.791 m)    Weight  181 lb (82.1 kg)    Waist Circumference  39 inches    Hip Circumference  39 inches    Waist to Hip Ratio  1 %    BMI (Calculated)  25.6    Single Leg Stand  12.21 seconds       Nutrition Therapy Plan and Nutrition Goals: Nutrition Therapy & Goals - 11/20/18 1704      Nutrition Therapy   Diet  DM    Drug/Food Interactions  Statins/Certain Fruits    Protein (specify units)  11oz    Fiber  35 grams    Whole Grain Foods  3 servings   eats oatmeal regularly and bread is often whole grain   Saturated Fats  14 max. grams    Fruits and Vegetables  5 servings/day   8 ideal   Sodium  1500 grams      Personal Nutrition Goals   Nutrition Goal  Drink at least one additional glass of fluids per day, ideally unsweetened    Personal Goal #2  Now that you have a new BG meter, experiment with type/frequency of HS snacks and BG readings upon waking in the morning. Try to identify patterns that may exist    Comments  S/p surgery pt reports BG readings to be abnormal and K+ to be elevated. He was given a new BG meter and is starting to see improvements in BG readings and has gone from  3 bananas / day to 0-1 / day to decrease K+ intake. HgbA1c 7.1 two weeks ago per his report, improved  from previous reading. Breakfast: oatmeal with walnuts and fruit, Lunch: soup with vegetables and chipped beef, sandwich, Dinner: baked potato, green beans, broccoli, chicken, fish, seafood, coleslaw, spaghetti and meatballs & other [place] food. If they eat out he tries to choose at least one vegetable as a side. He and his wife cook using olive oil and do not deep fry foods. Snacks: fruit, low sugar ice cream with nuts/ fudge/ amaretto occasionally, SF candy. Beverages: flavored water, diet soda, some water. He eats canned vegetables mostly in the winter but does rinse them off with water before eating.       Intervention Plan   Intervention  Prescribe, educate and counsel regarding individualized specific dietary modifications aiming towards targeted core components such as weight, hypertension, lipid management, diabetes, heart failure and other comorbidities.    Expected Outcomes  Short Term Goal: Understand basic principles of dietary content, such as calories, fat, sodium, cholesterol and nutrients.;Long Term Goal: Adherence to prescribed nutrition plan.;Short Term Goal: A plan has been developed with personal nutrition goals set during dietitian appointment.       Nutrition Assessments: Nutrition Assessments - 12/18/18 1804      MEDFICTS Scores   Post Score  25       Nutrition Goals Re-Evaluation: Nutrition Goals Re-Evaluation    [place] Name 11/20/18 1713             Goals   Nutrition Goal  Drink at least one additional glass of fluids per day, ideally unsweetened       Comment  Pt does not think that he drinks adequate fluids daily and today in class was having some irregular heart beats during exercise. He does not drink much water; prefers flavored water or diet soda. He sometimes keeps a bottle of water in his vehicle       Expected Outcome  He will drink at least one additional 8oz glass of fluids (ideally water) each day. Keep a water bottle handy IE in the car          Personal Goal #2 Re-Evaluation   Personal Goal #2  Now that you have a new BG meter, experiment with type/frequency of HS snacks and BG readings upon waking in the morning. Try to identify patterns that may exist          Nutrition Goals Discharge (Final Nutrition Goals Re-Evaluation): Nutrition Goals Re-Evaluation - 11/20/18 1713      Goals   Nutrition Goal  Drink at least one additional glass of fluids per day, ideally unsweetened    Comment  Pt does not think that he drinks adequate fluids daily and today in class was having some irregular heart beats during exercise. He does not drink much water; prefers flavored water or diet soda. He sometimes keeps a bottle of water in his vehicle    Expected Outcome  He will drink at least one additional 8oz glass of fluids (ideally water) each day. Keep a water bottle handy IE in the car      Personal Goal #2 Re-Evaluation   Personal Goal #2  Now that you have a new BG meter, experiment with type/frequency of HS snacks and BG readings upon waking in the morning. Try to identify patterns that may exist       Psychosocial: Target Goals: Acknowledge presence or absence of significant depression and/or stress, maximize  coping skills, provide positive support system. Participant is able to verbalize types and ability to use techniques and skills needed for reducing stress and depression.   Initial Review & Psychosocial Screening: Initial Psych Review & Screening - 10/26/18 1347      Initial Review   Current issues with  Current Stress Concerns    Source of Stress Concerns  Unable to perform yard/household activities    Comments  Daniel Mendez's biggest stressor currently is trying to get his blood sugar controlled. He and his wife think it is a medication issue and are trying to figure it out. 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       Family Dynamics   Good Support System?  Yes    wife      Barriers   Psychosocial barriers to participate in program  There are no identifiable barriers or psychosocial needs.;The patient should benefit from training in stress management and relaxation.      Screening Interventions   Interventions  Encouraged to exercise;Program counselor consult;Provide feedback about the scores to participant;To provide support and resources with identified psychosocial needs    Expected Outcomes  Short Term goal: Utilizing psychosocial counselor, staff and physician to assist with identification of specific Stressors or current issues interfering with healing process. Setting desired goal for each stressor or current issue identified.;Long Term Goal: Stressors or current issues are controlled or eliminated.;Short Term goal: Identification and review with participant of any Quality of Life or Depression concerns found by scoring the questionnaire.;Long Term goal: The participant improves quality of Life and PHQ9 Scores as seen by post scores and/or verbalization of changes       Quality of Life Scores:  Quality of Life - 12/18/18 1801      Quality of Life Scores   Health/Function Post  22.6 %    Socioeconomic Post  24.86 %    Psych/Spiritual Post  24 %    Family Post  24 %    GLOBAL Post  23.53 %   "Daniel Mendez" said he is seeing a urologist for ED problems so his Quality of life scores where a down l     Scores of 19 and below usually indicate a poorer quality of life in these areas.  A difference of  2-3 points is a clinically meaningful difference.  A difference of 2-3 points in the total score of the Quality of Life Index has been associated with significant improvement in overall quality of life, self-image, physical symptoms, and general health in studies assessing change in quality of life.  PHQ-9: Recent Review Flowsheet Data    Depression screen [facility] 2/9 12/18/2018 10/26/2018 03/30/2016 11/05/2015   Decreased Interest 0 0 0 0   Down, Depressed,  Hopeless 0 0 0 0   PHQ - 2 Score 0 0 0 0   Altered sleeping 1 0 - -   Tired, decreased energy 1 1 - -   Change in appetite 1 0 - -   Feeling bad or failure about yourself  0 0 - -   Trouble concentrating 0 0 - -   Moving slowly or fidgety/restless 0 0 - -   Suicidal thoughts 0 0 - -   PHQ-9 Score 3 1 - -   Difficult doing work/chores Not difficult at all Not difficult at all - -     Interpretation of Total Score  Total Score Depression  Severity:  1-4 = Minimal depression, 5-9 = Mild depression, 10-14 = Moderate depression, 15-19 = Moderately severe depression, 20-27 = Severe depression   Psychosocial Evaluation and Intervention: Psychosocial Evaluation - 11/06/18 1719      Psychosocial Evaluation & Interventions   Interventions  Stress management education;Encouraged to exercise with the program and follow exercise prescription;Relaxation education    Comments  Counselor met with Daniel Mendez) today for initial psychosocial evaluation.  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  He has a strong support system with a spouse of 67 years; a daughter locally and a son in [place].  Daniel Mendez reports having diabetes and low testosterone on top of his cardiac condition.  He sleeps well and has a good appetite.  He denies a history of depression or anxiety or any current symptoms.  Daniel Mendez states he is typically in a positive mood most of the time.  He has multiple stressors currently with a daughter who has battling with breast/ovarian cancer and having a mastectomy  later this week.  He is also concerned for his own health and that of his spouse who is a fall risk with multiple orthopaedic health issues.  Daniel Mendez has goals to increase his energy; stamina and strength while in this program and to breathe better.  Staff will follow with him.     Expected Outcomes  Short:  Daniel Mendez will exercise for his health and to reduce his stress with all that is going on in  his life currently.  Long:  Daniel Mendez will develop a routine of stress management practices - including exercise - for his health and his mental health.     Continue Psychosocial Services   Follow up required by staff       Psychosocial Re-Evaluation: Psychosocial Re-Evaluation    [place] Name 11/30/18 1558 12/04/18 1318 12/06/18 1710 12/18/18 1803       Psychosocial Re-Evaluation   Current issues with  Current Stress Concerns  -  -  Current Stress Concerns    Comments  -  -  Counselor follow up reporting is noticing some improvement in his stamina and strength since coming into this program.  He is able to get up the two flights of stairs at home more easily - but still would like it to be better.  His energy levels have improved and he continues to sleep well and be in a positive mood.  He continues to have stress in his life with his spouse and daughter's health, but he is coping well and maintains a positive attitude.  Counselor commended Mathis on his progress and he admits his biggest issue is the "need to be more patient with this process."  He will continue to be followed.  -    Expected Outcomes  -  -  Short:  Daniel Mendez will continue to exercise consistently in order to increase his stamina to tackle those flights of stairs with greater ease.  Long:  Daniel Mendez will maintain a positive attitude and become more patient with his recovery and expectations for progress.    "Daniel Mendez" said he is seeing a urologist for ED problems so his Quality of life scores where a down. Daniel Mendez said he knows it might be from his diabetes and hopes the urologist will help him.     Interventions  -  Stress management education  -  -    Continue Psychosocial Services   -  -  Follow  up required by staff  -    Comments  -  Daniel Mendez said the doctor told him blood sugars usually are elevated after surgery and now it is better as 150 fasting. Daniel Mendez is back to work but limited due to lifting precautions per his MD still.   -  -        Psychosocial Discharge (Final Psychosocial Re-Evaluation): Psychosocial Re-Evaluation - 12/18/18 1803      Psychosocial Re-Evaluation   Current issues with  Current Stress Concerns    Expected Outcomes  "Daniel Mendez" said he is seeing a urologist for ED problems so his Quality of life scores where a down. Daniel Mendez said he knows it might be from his diabetes and hopes the urologist will help him.        Vocational Rehabilitation: Provide vocational rehab assistance to qualifying candidates.   Vocational Rehab Evaluation & Intervention: Vocational Rehab - 10/26/18 1347      Initial Vocational Rehab Evaluation & Intervention   Assessment shows need for Vocational Rehabilitation  No       Education: Education Goals: Education classes will be provided on a variety of topics geared toward better understanding of heart health and risk factor modification. Participant will state understanding/return demonstration of topics presented as noted by education test scores.  Learning Barriers/Preferences: Learning Barriers/Preferences - 10/26/18 1342      Learning Barriers/Preferences   Learning Barriers  None    Learning Preferences  None       Education Topics:  AED/CPR: - Group verbal and written instruction with the use of models to demonstrate the basic use of the AED with the basic ABC's of resuscitation.   General Nutrition Guidelines/Fats and Fiber: -Group instruction provided by verbal, written material, models and posters to present the general guidelines for heart healthy nutrition. Gives an explanation and review of dietary fats and fiber.   Cardiac Rehab from 12/20/2018 in [facility] Cardiac and Pulmonary Rehab  Date  12/04/18  Educator  LB  Instruction Review Code  1- Verbalizes Understanding      Controlling Sodium/Reading Food Labels: -Group verbal and written material supporting the discussion of sodium use in heart healthy nutrition. Review and explanation with models,  verbal and written materials for utilization of the food label.   Cardiac Rehab from 12/20/2018 in [facility] Cardiac and Pulmonary Rehab  Date  12/06/18  Educator  LB  Instruction Review Code  1- Verbalizes Understanding      Exercise Physiology & General Exercise Guidelines: - Group verbal and written instruction with models to review the exercise physiology of the cardiovascular system and associated critical values. Provides general exercise guidelines with specific guidelines to those with heart or lung disease.    Cardiac Rehab from 12/20/2018 in [facility] Cardiac and Pulmonary Rehab  Date  12/11/18  Educator  [facility]  Instruction Review Code  1- Verbalizes Understanding      Aerobic Exercise & Resistance Training: - Gives group verbal and written instruction on the various components of exercise. Focuses on aerobic and resistive training programs and the benefits of this training and how to safely progress through these programs..   Cardiac Rehab from 12/20/2018 in [facility] Cardiac and Pulmonary Rehab  Date  12/13/18  Educator  AS  Instruction Review Code  1- Verbalizes Understanding      Flexibility, Balance, Mind/Body Relaxation: Provides group verbal/written instruction on the benefits of flexibility and balance training, including mind/body exercise modes such as yoga, pilates and tai chi.  Demonstration and skill  practice provided.   Cardiac Rehab from 12/20/2018 in [facility] Cardiac and Pulmonary Rehab  Date  12/18/18  Educator  AS  Instruction Review Code  1- Verbalizes Understanding      Stress and Anxiety: - Provides group verbal and written instruction about the health risks of elevated stress and causes of high stress.  Discuss the correlation between heart/lung disease and anxiety and treatment options. Review healthy ways to manage with stress and anxiety.   Depression: - Provides group verbal and written instruction on the correlation between heart/lung disease and depressed mood,  treatment options, and the stigmas associated with seeking treatment.   Cardiac Rehab from 12/20/2018 in [facility] Cardiac and Pulmonary Rehab  Date  11/29/18  Educator  Lucianne Lei, MSW  Instruction Review Code  2- Demonstrated Understanding      Anatomy & Physiology of the Heart: - Group verbal and written instruction and models provide basic cardiac anatomy and physiology, with the coronary electrical and arterial systems. Review of Valvular disease and Heart Failure   Cardiac Rehab from 12/20/2018 in [facility] Cardiac and Pulmonary Rehab  Date  10/30/18  Educator  CE  Instruction Review Code  1- Verbalizes Understanding      Cardiac Procedures: - Group verbal and written instruction to review commonly prescribed medications for heart disease. Reviews the medication, class of the drug, and side effects. Includes the steps to properly store meds and maintain the prescription regimen. (beta blockers and nitrates)   Cardiac Medications I: - Group verbal and written instruction to review commonly prescribed medications for heart disease. Reviews the medication, class of the drug, and side effects. Includes the steps to properly store meds and maintain the prescription regimen.   Cardiac Rehab from 12/20/2018 in [facility] Cardiac and Pulmonary Rehab  Date  11/06/18  Educator  CE  Instruction Review Code  1- Verbalizes Understanding      Cardiac Medications II: -Group verbal and written instruction to review commonly prescribed medications for heart disease. Reviews the medication, class of the drug, and side effects. (all other drug classes)   Cardiac Rehab from 12/20/2018 in [facility] Cardiac and Pulmonary Rehab  Date  12/20/18  Educator  [facility]  Instruction Review Code  1- Verbalizes Understanding       Go Sex-Intimacy & Heart Disease, Get SMART - Goal Setting: - Group verbal and written instruction through game format to discuss heart disease and the return to sexual intimacy. Provides group verbal  and written material to discuss and apply goal setting through the application of the S.M.A.R.T. Method.   Other Matters of the Heart: - Provides group verbal, written materials and models to describe Stable Angina and Peripheral Artery. Includes description of the disease process and treatment options available to the cardiac patient.   Cardiac Rehab from 12/20/2018 in [facility] Cardiac and Pulmonary Rehab  Date  10/30/18  Educator  CE  Instruction Review Code  1- Verbalizes Understanding      Exercise & Equipment Safety: - Individual verbal instruction and demonstration of equipment use and safety with use of the equipment.   Cardiac Rehab from 12/20/2018 in [facility] Cardiac and Pulmonary Rehab  Date  10/26/18  Educator  [facility]  Instruction Review Code  1- Verbalizes Understanding      Infection Prevention: - Provides verbal and written material to individual with discussion of infection control including proper hand washing and proper equipment cleaning during exercise session.   Cardiac Rehab from 12/20/2018 in [facility] Cardiac and Pulmonary Rehab  Date  10/26/18  Educator  [place]  Instruction Review Code  1- Verbalizes Understanding      Falls Prevention: - Provides verbal and written material to individual with discussion of falls prevention and safety.   Cardiac Rehab from 12/20/2018 in [facility] Cardiac and Pulmonary Rehab  Date  10/26/18  Educator  [facility]  Instruction Review Code  1- Verbalizes Understanding      Diabetes: - Individual verbal and written instruction to review signs/symptoms of diabetes, desired ranges of glucose level fasting, after meals and with exercise. Acknowledge that pre and post exercise glucose checks will be done for 3 sessions at entry of program.   Cardiac Rehab from 12/20/2018 in [facility] Cardiac and Pulmonary Rehab  Date  10/26/18  Educator  [facility]  Instruction Review Code  1- Verbalizes Understanding      Know Your Numbers and Risk Factors: -Group verbal and written  instruction about important numbers in your health.  Discussion of what are risk factors and how they play a role in the disease process.  Review of Cholesterol, Blood Pressure, Diabetes, and BMI and the role they play in your overall health.   Cardiac Rehab from 12/20/2018 in [facility] Cardiac and Pulmonary Rehab  Date  12/20/18  Educator  [facility]  Instruction Review Code  1- Verbalizes Understanding      Sleep Hygiene: -Provides group verbal and written instruction about how sleep can affect your health.  Define sleep hygiene, discuss sleep cycles and impact of sleep habits. Review good sleep hygiene tips.    Other: -Provides group and verbal instruction on various topics (see comments)   Knowledge Questionnaire Score: Knowledge Questionnaire Score - 12/18/18 1804      Knowledge Questionnaire Score   Post Score  25       Core Components/Risk Factors/Patient Goals at Admission: Personal Goals and Risk Factors at Admission - 10/26/18 1341      Core Components/Risk Factors/Patient Goals on Admission    Weight Management  Yes;Weight Maintenance    Intervention  Weight Management: Develop a combined nutrition and exercise program designed to reach desired caloric intake, while maintaining appropriate intake of nutrient and fiber, sodium and fats, and appropriate energy expenditure required for the weight goal.;Weight Management: Provide education and appropriate resources to help participant work on and attain dietary goals.    Expected Outcomes  Short Term: Continue to assess and modify interventions until short term weight is achieved;Long Term: Adherence to nutrition and physical activity/exercise program aimed toward attainment of established weight goal;Weight Maintenance: Understanding of the daily nutrition guidelines, which includes 25-35% calories from fat, 7% or less cal from saturated fats, less than 278m cholesterol, less than 1.5gm of sodium, & 5 or more servings of fruits and  vegetables daily;Understanding recommendations for meals to include 15-35% energy as protein, 25-35% energy from fat, 35-60% energy from carbohydrates, less than 2039mof dietary cholesterol, 20-35 gm of total fiber daily;Understanding of distribution of calorie intake throughout the day with the consumption of 4-5 meals/snacks    Diabetes  Yes    Intervention  Provide education about signs/symptoms and action to take for hypo/hyperglycemia.;Provide education about proper nutrition, including hydration, and aerobic/resistive exercise prescription along with prescribed medications to achieve blood glucose in normal ranges: Fasting glucose 65-99 mg/dL    Expected Outcomes  Short Term: Participant verbalizes understanding of the signs/symptoms and immediate care of hyper/hypoglycemia, proper foot care and importance of medication, aerobic/resistive exercise and nutrition plan for blood glucose control.;Long Term: Attainment of HbA1C < 7%.  Hypertension  Yes    Intervention  Provide education on lifestyle modifcations including regular physical activity/exercise, weight management, moderate sodium restriction and increased consumption of fresh fruit, vegetables, and low fat dairy, alcohol moderation, and smoking cessation.;Monitor prescription use compliance.    Expected Outcomes  Short Term: Continued assessment and intervention until BP is < 140/88m HG in hypertensive participants. < 130/817mHG in hypertensive participants with diabetes, heart failure or chronic kidney disease.;Long Term: Maintenance of blood pressure at goal levels.    Lipids  Yes    Intervention  Provide education and support for participant on nutrition & aerobic/resistive exercise along with prescribed medications to achieve LDL <7068mHDL >66m9m  Expected Outcomes  Short Term: Participant states understanding of desired cholesterol values and is compliant with medications prescribed. Participant is following exercise prescription  and nutrition guidelines.;Long Term: Cholesterol controlled with medications as prescribed, with individualized exercise RX and with personalized nutrition plan. Value goals: LDL < 70mg93mL > 40 mg.       Core Components/Risk Factors/Patient Goals Review:  Goals and Risk Factor Review    Row Name 11/30/18 1553 12/04/18 1314 12/04/18 1319         Core Components/Risk Factors/Patient Goals Review   Personal Goals Review  Weight Management/Obesity;Diabetes;Improve shortness of breath with ADL's;Hypertension;Lipids  Weight Management/Obesity;Improve shortness of breath with ADL's;Hypertension  Weight Management/Obesity     Review  LelanCasandra Doffings his energy , SOB and stamina have improved since starting to exercise.  He is exercising once per week outside HT.  He is taking meds as directed.  He takes BG every morning - this week it has been lower - 130-140.  He isn't checking BP at home.    Blood pressure has been doing well at home since got a new blood pressure cuff at home was 138 s242olic. Blood sugar in am fasting is 150. Stamina has improved and he is back working part time.   WEight is stable and cont exericse to keep his weight under control.     Expected Outcomes  Short - get BP monitor for home to check Long - continue to monitor BP/BG at home   Cont to monitor blood sugar at home and LelanCasandra Mendez his doctor wants his systolic at 130. 353ont to keep weight under control.        Core Components/Risk Factors/Patient Goals at Discharge (Final Review):  Goals and Risk Factor Review - 12/04/18 1319      Core Components/Risk Factors/Patient Goals Review   Personal Goals Review  Weight Management/Obesity    Review  WEight is stable and cont exericse to keep his weight under control.    Expected Outcomes  Cont to keep weight under control.       ITP Comments: ITP Comments    Row Name 10/26/18 1337 11/21/18 0649 12/20/18 1401       ITP Comments  Med Review completed. Initial ITP created.  Diagnosis can be found in HCL 10/28  30 Day Review. Continue with ITP unless directed changes per Medical Director review. New to program  30 day review completed. ITP sent to Dr. Mark Emily Filbertical Director of Cardiac Rehab. Continue with ITP unless changes are made by physician.        Comments: Discharge ITP sent and signed by Dr. MilleSabra Heckscharge Summary routed to PCP and cardiologist.

## 2018-12-21 NOTE — Patient Instructions (Signed)
Discharge Patient Instructions  Patient Details  Name: Daniel Mendez MRN: [id number] Date of Birth: 01-22-1941 Referring Provider:  Belva Crome, MD   Number of Visits: 6  Reason for Discharge:  Patient reached a stable level of exercise. Patient independent in their exercise. Patient has met program and personal goals.  Smoking History:  Social History   Tobacco Use  Smoking Status Never Smoker  Smokeless Tobacco Never Used    Diagnosis:  S/P CABG x 1  S/P TAVR (transcatheter aortic valve replacement)  Initial Exercise Prescription: Initial Exercise Prescription - 10/26/18 1400      Date of Initial Exercise RX and Referring Provider   Date  10/26/18    Referring Provider  [name]      Treadmill   MPH  2.5    Grade  0.5    Minutes  15    METs  3.09      Recumbant Bike   Level  3    RPM  60    Watts  32    Minutes  15    METs  3      REL-XR   Level  3    Speed  50    Minutes  15    METs  3      Prescription Details   Frequency (times per week)  3    Duration  Progress to 45 minutes of aerobic exercise without signs/symptoms of physical distress      Intensity   THRR 40-80% of Max Heartrate  99-127    Ratings of Perceived Exertion  11-13    Perceived Dyspnea  0-4      Resistance Training   Training Prescription  Yes    Weight  3 lb    Reps  10-15       Discharge Exercise Prescription (Final Exercise Prescription Changes): Exercise Prescription Changes - 12/13/18 1500      Response to Exercise   Blood Pressure (Admit)  140/70    Blood Pressure (Exit)  122/64    Heart Rate (Admit)  65 bpm    Heart Rate (Exercise)  99 bpm    Heart Rate (Exit)  63 bpm    Rating of Perceived Exertion (Exercise)  14    Symptoms  none    Duration  Continue with 45 min of aerobic exercise without signs/symptoms of physical distress.    Intensity  THRR unchanged      Progression   Progression  Continue to progress workloads to maintain intensity without  signs/symptoms of physical distress.    Average METs  4      Resistance Training   Training Prescription  Yes    Weight  3 lb    Reps  10-15      Interval Training   Interval Training  No      Treadmill   MPH  2.5    Grade  2    Minutes  15    METs  3.6      Recumbant Bike   Level  7    RPM  60    Watts  39    Minutes  15    METs  3.4      REL-XR   Level  7    Speed  50    Minutes  15    METs  5      Home Exercise Plan   Plans to continue exercise at  [facility] (comment)  [place]   Frequency  Add 2 additional days to program exercise sessions.    Initial Home Exercises Provided  11/09/18       Functional Capacity: 6 Minute Walk    Row Name 10/26/18 1438 12/14/18 1705       6 Minute Walk   Phase  Initial  Discharge    Distance  1342 feet  1660 feet    Distance % Change  -  24 %    Distance Feet Change  -  318 ft    Walk Time  6 minutes  6 minutes    # of Rest Breaks  0  0    MPH  2.54  3.14    METS  3.2  3.75    RPE  11  15    Perceived Dyspnea   0  1    VO2 Peak  11.17  13.12    Symptoms  No  No    Resting HR  71 bpm  62 bpm    Resting BP  148/60  132/80    Resting Oxygen Saturation   97 %  93 %    Max Ex. HR  105 bpm  107 bpm    Max Ex. BP  180/66  190/82    2 Minute Post BP  144/68  170/72       Quality of Life: Quality of Life - 12/18/18 1801      Quality of Life Scores   Health/Function Post  22.6 %    Socioeconomic Post  24.86 %    Psych/Spiritual Post  24 %    Family Post  24 %    GLOBAL Post  23.53 %   "Casandra Doffing" said he is seeing a urologist for ED problems so his Quality of life scores where a down l      Personal Goals: Goals established at orientation with interventions provided to work toward goal. Personal Goals and Risk Factors at Admission - 10/26/18 1341      Core Components/Risk Factors/Patient Goals on Admission    Weight Management  Yes;Weight Maintenance    Intervention  Weight Management:  Develop a combined nutrition and exercise program designed to reach desired caloric intake, while maintaining appropriate intake of nutrient and fiber, sodium and fats, and appropriate energy expenditure required for the weight goal.;Weight Management: Provide education and appropriate resources to help participant work on and attain dietary goals.    Expected Outcomes  Short Term: Continue to assess and modify interventions until short term weight is achieved;Long Term: Adherence to nutrition and physical activity/exercise program aimed toward attainment of established weight goal;Weight Maintenance: Understanding of the daily nutrition guidelines, which includes 25-35% calories from fat, 7% or less cal from saturated fats, less than 262m cholesterol, less than 1.5gm of sodium, & 5 or more servings of fruits and vegetables daily;Understanding recommendations for meals to include 15-35% energy as protein, 25-35% energy from fat, 35-60% energy from carbohydrates, less than 2041mof dietary cholesterol, 20-35 gm of total fiber daily;Understanding of distribution of calorie intake throughout the day with the consumption of 4-5 meals/snacks    Diabetes  Yes    Intervention  Provide education about signs/symptoms and action to take for hypo/hyperglycemia.;Provide education about proper nutrition, including hydration, and aerobic/resistive exercise prescription along with prescribed medications to achieve blood glucose in normal ranges: Fasting glucose 65-99 mg/dL    Expected Outcomes  Short Term: Participant verbalizes understanding of the signs/symptoms and immediate care of  hyper/hypoglycemia, proper foot care and importance of medication, aerobic/resistive exercise and nutrition plan for blood glucose control.;Long Term: Attainment of HbA1C < 7%.    Hypertension  Yes    Intervention  Provide education on lifestyle modifcations including regular physical activity/exercise, weight management, moderate sodium  restriction and increased consumption of fresh fruit, vegetables, and low fat dairy, alcohol moderation, and smoking cessation.;Monitor prescription use compliance.    Expected Outcomes  Short Term: Continued assessment and intervention until BP is < 140/76m HG in hypertensive participants. < 130/831mHG in hypertensive participants with diabetes, heart failure or chronic kidney disease.;Long Term: Maintenance of blood pressure at goal levels.    Lipids  Yes    Intervention  Provide education and support for participant on nutrition & aerobic/resistive exercise along with prescribed medications to achieve LDL <7035mHDL >26m67m  Expected Outcomes  Short Term: Participant states understanding of desired cholesterol values and is compliant with medications prescribed. Participant is following exercise prescription and nutrition guidelines.;Long Term: Cholesterol controlled with medications as prescribed, with individualized exercise RX and with personalized nutrition plan. Value goals: LDL < 70mg49mL > 40 mg.        Personal Goals Discharge: Goals and Risk Factor Review - 12/04/18 1319      Core Components/Risk Factors/Patient Goals Review   Personal Goals Review  Weight Management/Obesity    Review  WEight is stable and cont exericse to keep his weight under control.    Expected Outcomes  Cont to keep weight under control.       Exercise Goals and Review: Exercise Goals    Row Name 10/26/18 1438             Exercise Goals   Increase Physical Activity  Yes       Intervention  Provide advice, education, support and counseling about physical activity/exercise needs.;Develop an individualized exercise prescription for aerobic and resistive training based on initial evaluation findings, risk stratification, comorbidities and participant's personal goals.       Expected Outcomes  Short Term: Attend rehab on a regular basis to increase amount of physical activity.;Long Term: Exercising  regularly at least 3-5 days a week.;Long Term: Add in home exercise to make exercise part of routine and to increase amount of physical activity.       Increase Strength and Stamina  Yes       Intervention  Provide advice, education, support and counseling about physical activity/exercise needs.;Develop an individualized exercise prescription for aerobic and resistive training based on initial evaluation findings, risk stratification, comorbidities and participant's personal goals.       Expected Outcomes  Short Term: Increase workloads from initial exercise prescription for resistance, speed, and METs.;Short Term: Perform resistance training exercises routinely during rehab and add in resistance training at home;Long Term: Improve cardiorespiratory fitness, muscular endurance and strength as measured by increased METs and functional capacity (6MWT)       Able to understand and use rate of perceived exertion (RPE) scale  Yes       Intervention  Provide education and explanation on how to use RPE scale       Expected Outcomes  Short Term: Able to use RPE daily in rehab to express subjective intensity level;Long Term:  Able to use RPE to guide intensity level when exercising independently       Knowledge and understanding of Target Heart Rate Range (THRR)  Yes       Intervention  Provide education and explanation of  THRR including how the numbers were predicted and where they are located for reference       Expected Outcomes  Short Term: Able to state/look up THRR;Short Term: Able to use daily as guideline for intensity in rehab;Long Term: Able to use THRR to govern intensity when exercising independently       Able to check pulse independently  Yes       Intervention  Provide education and demonstration on how to check pulse in carotid and radial arteries.;Review the importance of being able to check your own pulse for safety during independent exercise       Expected Outcomes  Short Term: Able to explain  why pulse checking is important during independent exercise;Long Term: Able to check pulse independently and accurately       Understanding of Exercise Prescription  Yes       Intervention  Provide education, explanation, and written materials on patient's individual exercise prescription       Expected Outcomes  Short Term: Able to explain program exercise prescription;Long Term: Able to explain home exercise prescription to exercise independently          Exercise Goals Re-Evaluation: Exercise Goals Re-Evaluation    Row Name 10/30/18 1722 11/09/18 0834 11/14/18 1015 11/29/18 1318 12/13/18 1519     Exercise Goal Re-Evaluation   Exercise Goals Review  Increase Physical Activity;Increase Strength and Stamina;Able to understand and use rate of perceived exertion (RPE) scale;Knowledge and understanding of Target Heart Rate Range (THRR);Understanding of Exercise Prescription  Increase Physical Activity;Increase Strength and Stamina;Able to understand and use rate of perceived exertion (RPE) scale;Knowledge and understanding of Target Heart Rate Range (THRR);Able to check pulse independently;Understanding of Exercise Prescription  Increase Physical Activity;Increase Strength and Stamina;Able to understand and use rate of perceived exertion (RPE) scale;Knowledge and understanding of Target Heart Rate Range (THRR);Understanding of Exercise Prescription  Increase Physical Activity;Increase Strength and Stamina;Able to understand and use rate of perceived exertion (RPE) scale;Knowledge and understanding of Target Heart Rate Range (THRR);Understanding of Exercise Prescription  Increase Physical Activity;Increase Strength and Stamina;Able to understand and use rate of perceived exertion (RPE) scale;Able to understand and use Dyspnea scale;Knowledge and understanding of Target Heart Rate Range (THRR);Understanding of Exercise Prescription   Comments  Reviewed RPE scale, THRR, and initial exercise prescription today  with patient. Patient voiced understanding of these topics and tolerated first day of exercise well.   Reviewed home exercise with pt today.  Pt plans to continue to go to [facility] for exercise.  He uses the treadmills and machines when he is there.  Reviewed THR, pulse, RPE, sign and symptoms, NTG use, and when to call 911 or MD.  Also discussed weather considerations and indoor options.  Pt voiced understanding.  Casandra Doffing attends consistently and works in correct THR and RPE range.  Casandra Doffing is progressing well and has increased overall MET level.    Casandra Doffing attends consistently and has increased to average 4 METS.  He has increased levels on XR and RB.   Expected Outcomes  Short: use RPE daily to determine activity intensity. Long: Understand and follow exercise presciption.   Short: Continue to go to [facility] for exercise.  Long: Continue to increase activity levels.   Short - continue to attend consistently Long - increase MET level  Short - increase weight strength training Long - continue to improve MET level  Short - increase speed and grade on TM Long - increase overall MET level  Nutrition & Weight - Outcomes: Pre Biometrics - 10/26/18 1437      Pre Biometrics   Height  5' 10.5" (1.791 m)    Weight  178 lb 9.6 oz (81 kg)    Waist Circumference  37 inches    Hip Circumference  40 inches    Waist to Hip Ratio  0.92 %    BMI (Calculated)  25.26    Single Leg Stand  28 seconds      Post Biometrics - 12/14/18 1715       Post  Biometrics   Height  5' 10.5" (1.791 m)    Weight  181 lb (82.1 kg)    Waist Circumference  39 inches    Hip Circumference  39 inches    Waist to Hip Ratio  1 %    BMI (Calculated)  25.6    Single Leg Stand  12.21 seconds       Nutrition: Nutrition Therapy & Goals - 11/20/18 1704      Nutrition Therapy   Diet  DM    Drug/Food Interactions  Statins/Certain Fruits    Protein (specify units)  11oz    Fiber  35 grams    Whole Grain Foods  3  servings   eats oatmeal regularly and bread is often whole grain   Saturated Fats  14 max. grams    Fruits and Vegetables  5 servings/day   8 ideal   Sodium  1500 grams      Personal Nutrition Goals   Nutrition Goal  Drink at least one additional glass of fluids per day, ideally unsweetened    Personal Goal #2  Now that you have a new BG meter, experiment with type/frequency of HS snacks and BG readings upon waking in the morning. Try to identify patterns that may exist    Comments  S/p surgery pt reports BG readings to be abnormal and K+ to be elevated. He was given a new BG meter and is starting to see improvements in BG readings and has gone from 3 bananas / day to 0-1 / day to decrease K+ intake. HgbA1c 7.1 two weeks ago per his report, improved from previous reading. Breakfast: oatmeal with walnuts and fruit, Lunch: soup with vegetables and chipped beef, sandwich, Dinner: baked potato, green beans, broccoli, chicken, fish, seafood, coleslaw, spaghetti and meatballs & other [place] food. If they eat out he tries to choose at least one vegetable as a side. He and his wife cook using olive oil and do not deep fry foods. Snacks: fruit, low sugar ice cream with nuts/ fudge/ amaretto occasionally, SF candy. Beverages: flavored water, diet soda, some water. He eats canned vegetables mostly in the winter but does rinse them off with water before eating.       Intervention Plan   Intervention  Prescribe, educate and counsel regarding individualized specific dietary modifications aiming towards targeted core components such as weight, hypertension, lipid management, diabetes, heart failure and other comorbidities.    Expected Outcomes  Short Term Goal: Understand basic principles of dietary content, such as calories, fat, sodium, cholesterol and nutrients.;Long Term Goal: Adherence to prescribed nutrition plan.;Short Term Goal: A plan has been developed with personal nutrition goals set during dietitian  appointment.       Nutrition Discharge: Nutrition Assessments - 12/18/18 1804      MEDFICTS Scores   Post Score  25       Education Questionnaire Score: Knowledge Questionnaire Score - 12/18/18 1804  Knowledge Questionnaire Score   Post Score  25       Goals reviewed with patient; copy given to patient.

## 2018-12-21 NOTE — Progress Notes (Signed)
Discharge Progress Report  Patient Details  Name: Daniel Mendez MRN: [id number] Date of Birth: 03-May-1941 Referring Provider:     Cardiac Rehab from 10/26/2018 in [facility]  Referring Provider  [name]       Number of Visits: 36/36  Reason for Discharge:  Patient reached a stable level of exercise. Patient independent in their exercise.  Smoking History:  Social History   Tobacco Use  Smoking Status Never Smoker  Smokeless Tobacco Never Used    Diagnosis:  S/P CABG x 1  S/P TAVR (transcatheter aortic valve replacement)  ADL UCSD:   Initial Exercise Prescription: Initial Exercise Prescription - 10/26/18 1400      Date of Initial Exercise RX and Referring Provider   Date  10/26/18    Referring Provider  [name]      Treadmill   MPH  2.5    Grade  0.5    Minutes  15    METs  3.09      Recumbant Bike   Level  3    RPM  60    Watts  32    Minutes  15    METs  3      REL-XR   Level  3    Speed  50    Minutes  15    METs  3      Prescription Details   Frequency (times per week)  3    Duration  Progress to 45 minutes of aerobic exercise without signs/symptoms of physical distress      Intensity   THRR 40-80% of Max Heartrate  99-127    Ratings of Perceived Exertion  11-13    Perceived Dyspnea  0-4      Resistance Training   Training Prescription  Yes    Weight  3 lb    Reps  10-15       Discharge Exercise Prescription (Final Exercise Prescription Changes): Exercise Prescription Changes - 12/13/18 1500      Response to Exercise   Blood Pressure (Admit)  140/70    Blood Pressure (Exit)  122/64    Heart Rate (Admit)  65 bpm    Heart Rate (Exercise)  99 bpm    Heart Rate (Exit)  63 bpm    Rating of Perceived Exertion (Exercise)  14    Symptoms  none    Duration  Continue with 45 min of aerobic exercise without signs/symptoms of physical distress.    Intensity  THRR unchanged      Progression   Progression  Continue to  progress workloads to maintain intensity without signs/symptoms of physical distress.    Average METs  4      Resistance Training   Training Prescription  Yes    Weight  3 lb    Reps  10-15      Interval Training   Interval Training  No      Treadmill   MPH  2.5    Grade  2    Minutes  15    METs  3.6      Recumbant Bike   Level  7    RPM  60    Watts  39    Minutes  15    METs  3.4      REL-XR   Level  7    Speed  50    Minutes  15    METs  5  Home Exercise Plan   Plans to continue exercise at  [facility] (comment)   [facility]   Frequency  Add 2 additional days to program exercise sessions.    Initial Home Exercises Provided  11/09/18       Functional Capacity: 6 Minute Walk    Row Name 10/26/18 1438 12/14/18 1705       6 Minute Walk   Phase  Initial  Discharge    Distance  1342 feet  1660 feet    Distance % Change  -  24 %    Distance Feet Change  -  318 ft    Walk Time  6 minutes  6 minutes    # of Rest Breaks  0  0    MPH  2.54  3.14    METS  3.2  3.75    RPE  11  15    Perceived Dyspnea   0  1    VO2 Peak  11.17  13.12    Symptoms  No  No    Resting HR  71 bpm  62 bpm    Resting BP  148/60  132/80    Resting Oxygen Saturation   97 %  93 %    Max Ex. HR  105 bpm  107 bpm    Max Ex. BP  180/66  190/82    2 Minute Post BP  144/68  170/72       Psychological, QOL, Others - Outcomes: PHQ 2/9: Depression screen [facility] 2/9 12/18/2018 10/26/2018 03/30/2016 11/05/2015  Decreased Interest 0 0 0 0  Down, Depressed, Hopeless 0 0 0 0  PHQ - 2 Score 0 0 0 0  Altered sleeping 1 0 - -  Tired, decreased energy 1 1 - -  Change in appetite 1 0 - -  Feeling bad or failure about yourself  0 0 - -  Trouble concentrating 0 0 - -  Moving slowly or fidgety/restless 0 0 - -  Suicidal thoughts 0 0 - -  PHQ-9 Score 3 1 - -  Difficult doing work/chores Not difficult at all Not difficult at all - -    Quality of Life: Quality of Life - 12/18/18  1801      Quality of Life Scores   Health/Function Post  22.6 %    Socioeconomic Post  24.86 %    Psych/Spiritual Post  24 %    Family Post  24 %    GLOBAL Post  23.53 %   "Casandra Doffing" said he is seeing a urologist for ED problems so his Quality of life scores where a down l      Personal Goals: Goals established at orientation with interventions provided to work toward goal. Personal Goals and Risk Factors at Admission - 10/26/18 1341      Core Components/Risk Factors/Patient Goals on Admission    Weight Management  Yes;Weight Maintenance    Intervention  Weight Management: Develop a combined nutrition and exercise program designed to reach desired caloric intake, while maintaining appropriate intake of nutrient and fiber, sodium and fats, and appropriate energy expenditure required for the weight goal.;Weight Management: Provide education and appropriate resources to help participant work on and attain dietary goals.    Expected Outcomes  Short Term: Continue to assess and modify interventions until short term weight is achieved;Long Term: Adherence to nutrition and physical activity/exercise program aimed toward attainment of established weight goal;Weight Maintenance: Understanding of the daily nutrition guidelines, which includes  25-35% calories from fat, 7% or less cal from saturated fats, less than 252m cholesterol, less than 1.5gm of sodium, & 5 or more servings of fruits and vegetables daily;Understanding recommendations for meals to include 15-35% energy as protein, 25-35% energy from fat, 35-60% energy from carbohydrates, less than 2013mof dietary cholesterol, 20-35 gm of total fiber daily;Understanding of distribution of calorie intake throughout the day with the consumption of 4-5 meals/snacks    Diabetes  Yes    Intervention  Provide education about signs/symptoms and action to take for hypo/hyperglycemia.;Provide education about proper nutrition, including hydration, and  aerobic/resistive exercise prescription along with prescribed medications to achieve blood glucose in normal ranges: Fasting glucose 65-99 mg/dL    Expected Outcomes  Short Term: Participant verbalizes understanding of the signs/symptoms and immediate care of hyper/hypoglycemia, proper foot care and importance of medication, aerobic/resistive exercise and nutrition plan for blood glucose control.;Long Term: Attainment of HbA1C < 7%.    Hypertension  Yes    Intervention  Provide education on lifestyle modifcations including regular physical activity/exercise, weight management, moderate sodium restriction and increased consumption of fresh fruit, vegetables, and low fat dairy, alcohol moderation, and smoking cessation.;Monitor prescription use compliance.    Expected Outcomes  Short Term: Continued assessment and intervention until BP is < 140/9068mG in hypertensive participants. < 130/51m62m in hypertensive participants with diabetes, heart failure or chronic kidney disease.;Long Term: Maintenance of blood pressure at goal levels.    Lipids  Yes    Intervention  Provide education and support for participant on nutrition & aerobic/resistive exercise along with prescribed medications to achieve LDL <70mg15mL >40mg.65mExpected Outcomes  Short Term: Participant states understanding of desired cholesterol values and is compliant with medications prescribed. Participant is following exercise prescription and nutrition guidelines.;Long Term: Cholesterol controlled with medications as prescribed, with individualized exercise RX and with personalized nutrition plan. Value goals: LDL < 70mg, 23m> 40 mg.        Personal Goals Discharge: Goals and Risk Factor Review    Row Name 11/30/18 1553 12/04/18 1314 12/04/18 1319         Core Components/Risk Factors/Patient Goals Review   Personal Goals Review  Weight Management/Obesity;Diabetes;Improve shortness of breath with ADL's;Hypertension;Lipids  Weight  Management/Obesity;Improve shortness of breath with ADL's;Hypertension  Weight Management/Obesity     Review  Leland Casandra Doffinghis energy , SOB and stamina have improved since starting to exercise.  He is exercising once per week outside [place].  He is taking meds as directed.  He takes BG every morning - this week it has been lower - 130-140.  He isn't checking BP at home.    Blood pressure has been doing well at home since got a new blood pressure cuff at home was 138 sys433ic. Blood sugar in am fasting is 150. Stamina has improved and he is back working part time.   WEight is stable and cont exericse to keep his weight under control.     Expected Outcomes  Short - get BP monitor for home to check Long - continue to monitor BP/BG at home   Cont to monitor blood sugar at home and Leland Casandra Doffingis doctor wants his systolic at 130.   295t to keep weight under control.        Exercise Goals and Review: Exercise Goals    Row Name 10/26/18 1438             Exercise Goals   Increase Physical Activity  Yes       Intervention  Provide advice, education, support and counseling about physical activity/exercise needs.;Develop an individualized exercise prescription for aerobic and resistive training based on initial evaluation findings, risk stratification, comorbidities and participant's personal goals.       Expected Outcomes  Short Term: Attend rehab on a regular basis to increase amount of physical activity.;Long Term: Exercising regularly at least 3-5 days a week.;Long Term: Add in home exercise to make exercise part of routine and to increase amount of physical activity.       Increase Strength and Stamina  Yes       Intervention  Provide advice, education, support and counseling about physical activity/exercise needs.;Develop an individualized exercise prescription for aerobic and resistive training based on initial evaluation findings, risk stratification, comorbidities and participant's personal goals.        Expected Outcomes  Short Term: Increase workloads from initial exercise prescription for resistance, speed, and METs.;Short Term: Perform resistance training exercises routinely during rehab and add in resistance training at home;Long Term: Improve cardiorespiratory fitness, muscular endurance and strength as measured by increased METs and functional capacity (6MWT)       Able to understand and use rate of perceived exertion (RPE) scale  Yes       Intervention  Provide education and explanation on how to use RPE scale       Expected Outcomes  Short Term: Able to use RPE daily in rehab to express subjective intensity level;Long Term:  Able to use RPE to guide intensity level when exercising independently       Knowledge and understanding of Target Heart Rate Range (THRR)  Yes       Intervention  Provide education and explanation of THRR including how the numbers were predicted and where they are located for reference       Expected Outcomes  Short Term: Able to state/look up THRR;Short Term: Able to use daily as guideline for intensity in rehab;Long Term: Able to use THRR to govern intensity when exercising independently       Able to check pulse independently  Yes       Intervention  Provide education and demonstration on how to check pulse in carotid and radial arteries.;Review the importance of being able to check your own pulse for safety during independent exercise       Expected Outcomes  Short Term: Able to explain why pulse checking is important during independent exercise;Long Term: Able to check pulse independently and accurately       Understanding of Exercise Prescription  Yes       Intervention  Provide education, explanation, and written materials on patient's individual exercise prescription       Expected Outcomes  Short Term: Able to explain program exercise prescription;Long Term: Able to explain home exercise prescription to exercise independently          Exercise Goals  Re-Evaluation: Exercise Goals Re-Evaluation    Row Name 10/30/18 1722 11/09/18 0834 11/14/18 1015 11/29/18 1318 12/13/18 1519     Exercise Goal Re-Evaluation   Exercise Goals Review  Increase Physical Activity;Increase Strength and Stamina;Able to understand and use rate of perceived exertion (RPE) scale;Knowledge and understanding of Target Heart Rate Range (THRR);Understanding of Exercise Prescription  Increase Physical Activity;Increase Strength and Stamina;Able to understand and use rate of perceived exertion (RPE) scale;Knowledge and understanding of Target Heart Rate Range (THRR);Able to check pulse independently;Understanding of Exercise Prescription  Increase Physical Activity;Increase Strength and Stamina;Able to  understand and use rate of perceived exertion (RPE) scale;Knowledge and understanding of Target Heart Rate Range (THRR);Understanding of Exercise Prescription  Increase Physical Activity;Increase Strength and Stamina;Able to understand and use rate of perceived exertion (RPE) scale;Knowledge and understanding of Target Heart Rate Range (THRR);Understanding of Exercise Prescription  Increase Physical Activity;Increase Strength and Stamina;Able to understand and use rate of perceived exertion (RPE) scale;Able to understand and use Dyspnea scale;Knowledge and understanding of Target Heart Rate Range (THRR);Understanding of Exercise Prescription   Comments  Reviewed RPE scale, THRR, and initial exercise prescription today with patient. Patient voiced understanding of these topics and tolerated first day of exercise well.   Reviewed home exercise with pt today.  Pt plans to continue to go to [facility] for exercise.  He uses the treadmills and machines when he is there.  Reviewed THR, pulse, RPE, sign and symptoms, NTG use, and when to call 911 or MD.  Also discussed weather considerations and indoor options.  Pt voiced understanding.  Casandra Doffing attends consistently and works in correct THR  and RPE range.  Casandra Doffing is progressing well and has increased overall MET level.    Casandra Doffing attends consistently and has increased to average 4 METS.  He has increased levels on XR and RB.   Expected Outcomes  Short: use RPE daily to determine activity intensity. Long: Understand and follow exercise presciption.   Short: Continue to go to [facility] for exercise.  Long: Continue to increase activity levels.   Short - continue to attend consistently Long - increase MET level  Short - increase weight strength training Long - continue to improve MET level  Short - increase speed and grade on TM Long - increase overall MET level      Nutrition & Weight - Outcomes: Pre Biometrics - 10/26/18 1437      Pre Biometrics   Height  5' 10.5" (1.791 m)    Weight  178 lb 9.6 oz (81 kg)    Waist Circumference  37 inches    Hip Circumference  40 inches    Waist to Hip Ratio  0.92 %    BMI (Calculated)  25.26    Single Leg Stand  28 seconds      Post Biometrics - 12/14/18 1715       Post  Biometrics   Height  5' 10.5" (1.791 m)    Weight  181 lb (82.1 kg)    Waist Circumference  39 inches    Hip Circumference  39 inches    Waist to Hip Ratio  1 %    BMI (Calculated)  25.6    Single Leg Stand  12.21 seconds       Nutrition: Nutrition Therapy & Goals - 11/20/18 1704      Nutrition Therapy   Diet  DM    Drug/Food Interactions  Statins/Certain Fruits    Protein (specify units)  11oz    Fiber  35 grams    Whole Grain Foods  3 servings   eats oatmeal regularly and bread is often whole grain   Saturated Fats  14 max. grams    Fruits and Vegetables  5 servings/day   8 ideal   Sodium  1500 grams      Personal Nutrition Goals   Nutrition Goal  Drink at least one additional glass of fluids per day, ideally unsweetened    Personal Goal #2  Now that you have a new BG meter, experiment with type/frequency of HS snacks and  BG readings upon waking in the morning. Try to identify patterns that may  exist    Comments  S/p surgery pt reports BG readings to be abnormal and K+ to be elevated. He was given a new BG meter and is starting to see improvements in BG readings and has gone from 3 bananas / day to 0-1 / day to decrease K+ intake. HgbA1c 7.1 two weeks ago per his report, improved from previous reading. Breakfast: oatmeal with walnuts and fruit, Lunch: soup with vegetables and chipped beef, sandwich, Dinner: baked potato, green beans, broccoli, chicken, fish, seafood, coleslaw, spaghetti and meatballs & other [place] food. If they eat out he tries to choose at least one vegetable as a side. He and his wife cook using olive oil and do not deep fry foods. Snacks: fruit, low sugar ice cream with nuts/ fudge/ amaretto occasionally, SF candy. Beverages: flavored water, diet soda, some water. He eats canned vegetables mostly in the winter but does rinse them off with water before eating.       Intervention Plan   Intervention  Prescribe, educate and counsel regarding individualized specific dietary modifications aiming towards targeted core components such as weight, hypertension, lipid management, diabetes, heart failure and other comorbidities.    Expected Outcomes  Short Term Goal: Understand basic principles of dietary content, such as calories, fat, sodium, cholesterol and nutrients.;Long Term Goal: Adherence to prescribed nutrition plan.;Short Term Goal: A plan has been developed with personal nutrition goals set during dietitian appointment.       Nutrition Discharge: Nutrition Assessments - 12/18/18 1804      MEDFICTS Scores   Post Score  25       Education Questionnaire Score: Knowledge Questionnaire Score - 12/18/18 1804      Knowledge Questionnaire Score   Post Score  25       Goals reviewed with patient; copy given to patient.

## 2019-02-22 ENCOUNTER — Telehealth: Payer: Self-pay | Admitting: Interventional Cardiology

## 2019-02-22 NOTE — Telephone Encounter (Signed)
Hopefully he is doing well.  COVID-19 precautions, he does not need to come into the office.  We can set up a video visit or move his appointment back by 3 months.

## 2019-02-23 NOTE — Telephone Encounter (Signed)
Spoke with pt and changed his appt to an e-visit on Monday.  He is having some issues with fatigue.

## 2019-02-23 NOTE — Telephone Encounter (Signed)
Pt called back this am. He is interested in doing a virtual visit in place of his office visit.

## 2019-02-26 ENCOUNTER — Other Ambulatory Visit: Payer: Self-pay

## 2019-02-26 ENCOUNTER — Encounter: Payer: Self-pay | Admitting: Interventional Cardiology

## 2019-02-26 ENCOUNTER — Telehealth (INDEPENDENT_AMBULATORY_CARE_PROVIDER_SITE_OTHER): Payer: Medicare Other | Admitting: Interventional Cardiology

## 2019-02-26 VITALS — BP 136/71 | HR 83 | Ht 70.5 in | Wt 179.0 lb

## 2019-02-26 DIAGNOSIS — E118 Type 2 diabetes mellitus with unspecified complications: Secondary | ICD-10-CM

## 2019-02-26 DIAGNOSIS — I6522 Occlusion and stenosis of left carotid artery: Secondary | ICD-10-CM

## 2019-02-26 DIAGNOSIS — I10 Essential (primary) hypertension: Secondary | ICD-10-CM | POA: Diagnosis not present

## 2019-02-26 DIAGNOSIS — E785 Hyperlipidemia, unspecified: Secondary | ICD-10-CM | POA: Diagnosis not present

## 2019-02-26 DIAGNOSIS — Z953 Presence of xenogenic heart valve: Secondary | ICD-10-CM

## 2019-02-26 DIAGNOSIS — R29898 Other symptoms and signs involving the musculoskeletal system: Secondary | ICD-10-CM | POA: Diagnosis not present

## 2019-02-26 DIAGNOSIS — I25810 Atherosclerosis of coronary artery bypass graft(s) without angina pectoris: Secondary | ICD-10-CM

## 2019-02-26 DIAGNOSIS — R0609 Other forms of dyspnea: Secondary | ICD-10-CM

## 2019-02-26 DIAGNOSIS — R0600 Dyspnea, unspecified: Secondary | ICD-10-CM

## 2019-02-26 MED ORDER — TELMISARTAN 40 MG PO TABS: 40.0000 mg | ORAL_TABLET | Freq: Every day | ORAL | 3 refills | Status: AC

## 2019-02-26 NOTE — Patient Instructions (Signed)
Medication Instructions:  Your physician has recommended you make the following change in your medication:  CONTINUE Micardis (Telmisartan) 40 mg daily  (our chart had incorrect dose of 20 mg daily, patient has already been taking 40 mg daily) Will call if he needs a new Rx  Lab work: Your physician recommends that you return for lab work in: 4-6 months on the day of or a few days before your office visit with Dr. Gaspar Bidding will need to FAST for this appointment - nothing to eat or drink after midnight the night before except water. Your lab appointment is scheduled for September 9. You may come in anytime after 7:30 am for your lab work. Call back to reschedule if needed based on future appointment with Dr. [name]    Testing/Procedures: None Ordered   Follow-Up: At [facility], you and your health needs are our priority.  As part of our continuing mission to provide you with exceptional heart care, we have created designated Provider Care Teams.  These Care Teams include your primary Cardiologist (physician) and Advanced Practice Providers (APPs -  Physician Assistants and Nurse Practitioners) who all work together to provide you with the care you need, when you need it. You will need a follow up appointment in 4-6 months.  Please call our office 2 months in advance to schedule this appointment.  You may see Dr. [name] or one of the following Advanced Practice Providers on your designated Care Team:   Truitt Merle, NP Cecilie Kicks, NP . Kathyrn Drown, NP

## 2019-02-26 NOTE — Progress Notes (Signed)
Virtual Visit via Video Note   This visit type was conducted due to national recommendations for restrictions regarding the COVID-19 Pandemic (e.g. social distancing) in an effort to limit this patient's exposure and mitigate transmission in our community.  Due to his co-morbid illnesses, this patient is at least at moderate risk for complications without adequate follow up.  This format is felt to be most appropriate for this patient at this time.  All issues noted in this document were discussed and addressed.  A limited physical exam was performed with this format.  Please refer to the patient's chart for his consent to telehealth for [facility].   Evaluation Performed:  Follow-up visit  Date:  02/26/2019   ID:  Daniel Mendez, DOB [birth year], MRN [id number]  Patient Location: Home  Provider Location: Office  PCP:  Seward Carol, MD  Cardiologist: Illene Labrador, III, MD Electrophysiologist:  None   Chief Complaint: Follow-up aortic valve replacement and coronary bypass surgery.  History of Present Illness:    Daniel Mendez is a 78 y.o. male who presents via audio/video conferencing for a telehealth visit today.    He has left carotid endarterectomy, moderate aortic stenosis, CAD with multivessel CABG October 2019 and surgical aortic valve replacement #25 bioprosthetic, essential hypertension, and hyperlipidemia and recently noted to have hypercalcemia.  In cardiac rehab been noted to have PVCs.  Weakness in legs when he ambulates.  Shortness of breath is improved.  The legs do not hurt.  He denies orthopnea, PND, and syncope.  If he squats and then stands he gets dizzy.  Some confusion concerning telmisartan dose.  He is currently on telmisartan 40 mg/day (he is taking one half of an 80 mg tablet daily).  His last prescription for telmisartan was a 20 mg tablet which I told him to use to with each dose until that supply is exhausted.  Once his 80 mg tablets and 20 mg  tablets are completely used up, we will send a new prescription for either 40 or 80 mg.  Overall doing well without nausea, shortness of breath, decreased appetite or any other complaints.  The patient does not have symptoms concerning for COVID-19 infection (fever, chills, cough, or new shortness of breath).    Past Medical History:  Diagnosis Date  . Anginal pain ([place])   . Arthritis    hand- right  . Coronary artery disease   . Diabetes ([place])    type 2  . GERD (gastroesophageal reflux disease)   . Heart murmur    discovered in 2015 - followed by Dr. Daneen Schick  . History of hiatal hernia   . History of kidney stones   . HTN (hypertension)   . Hypercalcemia   . Hyperglyceridemia   . Hyperlipidemia   . Mild aortic stenosis Oct 2014  . Peripheral vascular disease ([place])   . Pneumonia 07/28/2018  . Shortness of breath dyspnea    on exertion  . Sleep apnea    no cpap   Past Surgical History:  Procedure Laterality Date  . AORTIC VALVE REPLACEMENT N/A 09/18/2018   Procedure: AORTIC VALVE REPLACEMENT (AVR) using inspiris valve - size 25;  Surgeon: Gaye Pollack, MD;  [facility];  Service: Open Heart Surgery;  Laterality: N/A;  . CATARACT EXTRACTION Bilateral   . COLONOSCOPY WITH PROPOFOL N/A 11/19/2014   Procedure: COLONOSCOPY WITH PROPOFOL;  Surgeon: Garlan Fair, MD;  [facility];  Service: Endoscopy;  Laterality:  N/A;  . CORONARY ARTERY BYPASS GRAFT N/A 09/18/2018   Procedure: CORONARY ARTERY BYPASS GRAFTING (CABG) X 1 using right greater saphenous vein harvested endoscopically.;  Surgeon: Gaye Pollack, MD;  [facility];  Service: Open Heart Surgery;  Laterality: N/A;  . dental implants     dental post implanted to hold dentures/partials in place  . ENDARTERECTOMY Left 03/10/2015   Procedure: Left Carotid ENDARTERECTOMY ;  Surgeon: Rosetta Posner, MD;  Location: [place];  Service: Vascular;  Laterality: Left;  . FRACTURE SURGERY Left 1958   Left hand    . HERNIA REPAIR     Bilateral inguinal   . hernia repair b/l inguinal Bilateral   . knee arthroscopy left Left   . RIGHT/LEFT HEART CATH AND CORONARY ANGIOGRAPHY N/A 08/12/2017   Procedure: RIGHT/LEFT HEART CATH AND CORONARY ANGIOGRAPHY;  Surgeon: Belva Crome, MD;  Location: [place] [facility];  Service: Cardiovascular;  Laterality: N/A;  . RIGHT/LEFT HEART CATH AND CORONARY ANGIOGRAPHY N/A 09/01/2018   Procedure: RIGHT/LEFT HEART CATH AND CORONARY ANGIOGRAPHY;  Surgeon: Belva Crome, MD;  Location: [place] [facility];  Service: Cardiovascular;  Laterality: N/A;  . ROTATOR CUFF REPAIR    . TEE WITHOUT CARDIOVERSION N/A 09/18/2018   Procedure: TRANSESOPHAGEAL ECHOCARDIOGRAM (TEE);  Surgeon: Gaye Pollack, MD;  Location: [place];  Service: Open Heart Surgery;  Laterality: N/A;  . VASECTOMY       Current Meds  Medication Sig  . amLODipine (NORVASC) 5 MG tablet Take 1 tablet (5 mg total) by mouth daily.  [name] Kitchen aspirin 325 MG EC tablet Take 1 tablet (325 mg total) by mouth daily.  [name] Kitchen atorvastatin (LIPITOR) 20 MG tablet Take 20 mg by mouth daily at 12 noon.   [name] Kitchen glimepiride (AMARYL) 4 MG tablet Take 4 mg by mouth daily with breakfast.  . metFORMIN (GLUCOPHAGE) 500 MG tablet Take 1,000 mg by mouth 2 (two) times daily.   . metoprolol succinate (TOPROL-XL) 25 MG 24 hr tablet Take 0.5 tablets (12.5 mg total) by mouth daily.  . Omega-3 Fatty Acids (FISH OIL) 1000 MG CAPS Take 1,000 mg by mouth daily.   [name] Kitchen omeprazole (PRILOSEC) 20 MG capsule Take 20 mg by mouth every other day. At noon  . ONE TOUCH ULTRA TEST test strip 1 each by Other route daily.   Glory Rosebush DELICA LANCETS 54Y MISC 1 each by Other route daily.   . sitaGLIPtin (JANUVIA) 100 MG tablet Take 100 mg by mouth daily with lunch.   . tamsulosin (FLOMAX) 0.4 MG CAPS capsule Take 0.4 mg by mouth every evening.  . testosterone cypionate (DEPOTESTOSTERONE CYPIONATE) 200 MG/ML injection Inject 200 mg into the muscle every 14 (fourteen) days.   . traMADol (ULTRAM) 50 MG tablet Take 50 mg by mouth every 4-6 hours PRN severe pain  . [DISCONTINUED] telmisartan (MICARDIS) 20 MG tablet Take 20 mg by mouth daily.      Allergies:   Patient has no known allergies.   Social History   Tobacco Use  . Smoking status: Never Smoker  . Smokeless tobacco: Never Used  Substance Use Topics  . Alcohol use: Yes    Alcohol/week: 0.0 standard drinks    Comment: rarely  . Drug use: No     Family Hx: The patient's family history includes CVA in his mother; Cancer in his daughter; Diabetes in his brother; Heart disease in his brother and father; Peripheral vascular disease in his mother.  ROS:   Please see the  history of present illness.    Decreased hearing, dizziness when he stands suddenly, legs are fatigued when he walks.  Denies palpitations and syncope. All other systems reviewed and are negative.   Prior CV studies:   The following studies were reviewed today:   No new functional data  Labs/Other Tests and Data Reviewed:    EKG:  No ECG reviewed.  Recent Labs: 08/29/2018: NT-Pro BNP 156 09/20/2018: ALT 20 11/27/2018: Hemoglobin 9.4; Magnesium 1.7; Platelets 378; TSH 1.820 12/06/2018: BUN 21; Creatinine, Ser 1.37; Potassium 5.1; Sodium 140   Recent Lipid Panel No results found for: CHOL, TRIG, HDL, CHOLHDL, LDLCALC, LDLDIRECT  Wt Readings from Last 3 Encounters:  02/26/19 179 lb (81.2 kg)  12/14/18 181 lb (82.1 kg)  11/27/18 179 lb 6.4 oz (81.4 kg)     Objective:    Vital Signs:  BP 136/71   Pulse 83   Ht 5' 10.5" (1.791 m)   Wt 179 lb (81.2 kg)   SpO2 93%   BMI 25.32 kg/m    Well nourished, well developed male in no acute distress. There is no edema.  He does not notice skin discoloration.  ASSESSMENT & PLAN:    1. S/P aortic valve replacement with bioprosthetic valve   2. Dyspnea on exertion   3. CAD of autologous bypass graft   4. Dyslipidemia   5. Essential hypertension, uncontrolled   6. Left carotid  artery stenosis   7. Diabetes mellitus type 2 with complications ([place])   8. Weakness of both lower extremities    PLAN in order of problems:  1. Unable to perform auscultation.  No symptoms to suggest valve dysfunction. 2. Dyspnea on exertion has resolved since the last office visit in January. 3. Denies exertional chest discomfort/angina. 4. Lipid panel and a comprehensive metabolic panel will be done at the next visit in 4 to 6 months. 5. He is on Talmo Sartain 80 mg/day.  This dose will be continued.  Until he gives out of his current supply he will take either two 20 mg tablets at once daily or a half of an 80 mg tablet daily.  After this we will renew his prescription using either an 80 mg or 40 mg tablet. 6. Carotid evaluation done in October 2019 prior to valve replacement revealed bilateral less than 40% carotid obstruction. 7. His blood sugars are running between 100 and 180 mg/dL.  Encouraged him to discuss diabetes control with Dr. Delfina Redwood. 8. He is concerned about lower extremity weakness when walking up an incline.  I encouraged continued efforts to who regained his prior physical conditioning.  If this is still problematic on return, we will consider doing a bilateral lower extremity Doppler exam to rule out PAD.  Overall plan is to progress physical activity.  Clinical follow-up in 4 to 6 months.  Comprehensive metabolic panel and lipid panel will be done at that appointment.  COVID-19 Education: The signs and symptoms of COVID-19 were discussed with the patient and how to seek care for testing (follow up with PCP or arrange E-visit).  The importance of social distancing was discussed today.  Time:   Today, I have spent 15 minutes with the patient with telehealth technology discussing the above problems.     Medication Adjustments/Labs and Tests Ordered: Current medicines are reviewed at length with the patient today.  Concerns regarding medicines are outlined above.  Tests  Ordered: Orders Placed This Encounter  Procedures  . Lipid Profile  . Basic Metabolic Panel (  BMET)  . Hepatic function panel   Medication Changes: Meds ordered this encounter  Medications  . telmisartan (MICARDIS) 40 MG tablet    Sig: Take 1 tablet (40 mg total) by mouth daily.    Dispense:  90 tablet    Refill:  3    Disposition:  Follow up in 5 month(s)  Signed, Sinclair Grooms, MD  02/26/2019 10:03 AM    [place]

## 2019-03-09 ENCOUNTER — Other Ambulatory Visit: Payer: Self-pay | Admitting: Urology

## 2019-03-09 DIAGNOSIS — R972 Elevated prostate specific antigen [PSA]: Secondary | ICD-10-CM

## 2019-03-26 ENCOUNTER — Other Ambulatory Visit: Payer: Medicare Other

## 2019-03-30 ENCOUNTER — Other Ambulatory Visit: Payer: Self-pay

## 2019-03-30 ENCOUNTER — Ambulatory Visit
Admission: RE | Admit: 2019-03-30 | Discharge: 2019-03-30 | Disposition: A | Discharge status: Home / Self Care | Payer: Medicare Other | Source: Ambulatory Visit | Attending: Urology | Admitting: Urology

## 2019-03-30 DIAGNOSIS — R972 Elevated prostate specific antigen [PSA]: Secondary | ICD-10-CM

## 2019-03-30 MED ORDER — GADOBENATE DIMEGLUMINE 529 MG/ML IV SOLN
16.0000 mL | Freq: Once | INTRAVENOUS | Status: AC | PRN
  Administered 2019-03-30: 16 mL via INTRAVENOUS

## 2019-06-08 ENCOUNTER — Other Ambulatory Visit (HOSPITAL_COMMUNITY): Payer: Self-pay | Admitting: *Deleted

## 2019-06-11 ENCOUNTER — Other Ambulatory Visit: Payer: Self-pay

## 2019-06-11 ENCOUNTER — Ambulatory Visit (HOSPITAL_COMMUNITY)
Admission: RE | Admit: 2019-06-11 | Discharge: 2019-06-11 | Disposition: A | Discharge status: Home / Self Care | Payer: Medicare Other | Source: Ambulatory Visit | Attending: Internal Medicine | Admitting: Internal Medicine

## 2019-06-11 DIAGNOSIS — M810 Age-related osteoporosis without current pathological fracture: Secondary | ICD-10-CM | POA: Insufficient documentation

## 2019-06-11 MED ORDER — ZOLEDRONIC ACID 5 MG/100ML IV SOLN
INTRAVENOUS | Status: AC
  Administered 2019-06-11: 5 mg via INTRAVENOUS
  Filled 2019-06-11: qty 100

## 2019-06-11 MED ORDER — ZOLEDRONIC ACID 5 MG/100ML IV SOLN
5.0000 mg | Freq: Once | INTRAVENOUS | Status: AC
  Administered 2019-06-11: 10:00:00 5 mg via INTRAVENOUS

## 2019-06-28 ENCOUNTER — Telehealth: Payer: Self-pay | Admitting: *Deleted

## 2019-06-28 NOTE — Telephone Encounter (Signed)
   Primary Cardiologist: Sinclair Grooms, MD  Chart reviewed as part of pre-operative protocol coverage. Patient was contacted 06/28/2019 in reference to pre-operative risk assessment for pending surgery as outlined below.  Daniel Mendez was last seen on 02/26/2019 by Dr. [name] via a telemedicine visit.  Since that day, Daniel Mendez has done well from a cardiac standpoint. He reports improvement in energy level since CABG 08/2019. He continues to garden, mow his lawn, and perform household chores without anginal complaints.   Therefore, based on ACC/AHA guidelines, the patient would be at acceptable risk for the planned procedure without further cardiovascular testing.   I will route this recommendation to the requesting party via Epic fax function and remove from pre-op pool.  Please call with questions.  Abigail Butts, PA-C 06/28/2019, 5:13 PM

## 2019-06-28 NOTE — Telephone Encounter (Signed)
   [facility] Pre-operative Risk Assessment    Request for surgical clearance:  1. What type of surgery is being performed?  COLONOSCOPY / ENDOSCOPY   2. When is this surgery scheduled?  07/31/2019   3. What type of clearance is required (medical clearance vs. Pharmacy clearance to hold med vs. Both)?  MEDICAL  4. Are there any medications that need to be held prior to surgery and how long? N/A   5. Practice name and name of physician performing surgery?   [facility] / DR. BRAMBHATT  6. What is your office phone number  [phone]   7.   What is your office fax number  [phone]  8.   Anesthesia type (None, local, MAC, general) ?  PROPOFOL    Daniel Mendez 06/28/2019, 3:56 PM  _________________________________________________________________   (provider comments below)

## 2019-08-01 ENCOUNTER — Other Ambulatory Visit: Payer: Medicare Other | Admitting: *Deleted

## 2019-08-01 ENCOUNTER — Other Ambulatory Visit: Payer: Self-pay

## 2019-08-01 DIAGNOSIS — I25810 Atherosclerosis of coronary artery bypass graft(s) without angina pectoris: Secondary | ICD-10-CM

## 2019-08-01 DIAGNOSIS — R0609 Other forms of dyspnea: Secondary | ICD-10-CM

## 2019-08-01 DIAGNOSIS — I6522 Occlusion and stenosis of left carotid artery: Secondary | ICD-10-CM

## 2019-08-01 DIAGNOSIS — I10 Essential (primary) hypertension: Secondary | ICD-10-CM

## 2019-08-01 DIAGNOSIS — Z953 Presence of xenogenic heart valve: Secondary | ICD-10-CM

## 2019-08-01 DIAGNOSIS — E118 Type 2 diabetes mellitus with unspecified complications: Secondary | ICD-10-CM

## 2019-08-01 DIAGNOSIS — E785 Hyperlipidemia, unspecified: Secondary | ICD-10-CM

## 2019-08-01 DIAGNOSIS — R0600 Dyspnea, unspecified: Secondary | ICD-10-CM

## 2019-08-01 LAB — HEPATIC FUNCTION PANEL
ALT: 29 IU/L (ref 0–44)
AST: 19 IU/L (ref 0–40)
Albumin: 4.5 g/dL (ref 3.7–4.7)
Alkaline Phosphatase: 64 IU/L (ref 39–117)
Bilirubin Total: 0.2 mg/dL (ref 0.0–1.2)
Bilirubin, Direct: 0.09 mg/dL (ref 0.00–0.40)
Total Protein: 6.2 g/dL (ref 6.0–8.5)

## 2019-08-01 LAB — LIPID PANEL
Chol/HDL Ratio: 4.4 ratio (ref 0.0–5.0)
Cholesterol, Total: 151 mg/dL (ref 100–199)
HDL: 34 mg/dL — ABNORMAL LOW (ref >39)
LDL Chol Calc (NIH): 88 mg/dL (ref 0–99)
Triglycerides: 164 mg/dL — ABNORMAL HIGH (ref 0–149)
VLDL Cholesterol Cal: 29 mg/dL (ref 5–40)

## 2019-08-01 LAB — BASIC METABOLIC PANEL
BUN/Creatinine Ratio: 12 (ref 10–24)
BUN: 18 mg/dL (ref 8–27)
CO2: 21 mmol/L (ref 20–29)
Calcium: 9.8 mg/dL (ref 8.6–10.2)
Chloride: 102 mmol/L (ref 96–106)
Creatinine, Ser: 1.54 mg/dL — ABNORMAL HIGH (ref 0.76–1.27)
GFR calc Af Amer: 49 mL/min/{1.73_m2} — ABNORMAL LOW (ref >59)
GFR calc non Af Amer: 42 mL/min/{1.73_m2} — ABNORMAL LOW (ref >59)
Glucose: 194 mg/dL — ABNORMAL HIGH (ref 65–99)
Potassium: 4.5 mmol/L (ref 3.5–5.2)
Sodium: 135 mmol/L (ref 134–144)

## 2019-08-02 ENCOUNTER — Telehealth: Payer: Self-pay | Admitting: *Deleted

## 2019-08-02 MED ORDER — ASPIRIN EC 81 MG PO TBEC: 81.0000 mg | DELAYED_RELEASE_TABLET | Freq: Every day | ORAL | 3 refills | Status: AC

## 2019-08-02 MED ORDER — VASCEPA 1 G PO CAPS: 2.0000 | ORAL_CAPSULE | Freq: Two times a day (BID) | ORAL | 11 refills | Status: DC

## 2019-08-02 NOTE — Telephone Encounter (Signed)
Spoke with pt and went over results and recommendations per Dr. [name].  Pt verbalized understanding and was in agreement with plan.  While speaking with pt he mentioned that he recently had an endoscopy/colonoscopy.  He states the doctor mentioned to him that his he inflammation in his stomach and has had some bleeding.  Doctor told him it could be coming from medication.  Pt inquired about dropping ASA to 81mg  QD.  Advised I would send to Dr. [name] for review.

## 2019-08-02 NOTE — Telephone Encounter (Signed)
It should be 81 mg.

## 2019-08-02 NOTE — Telephone Encounter (Signed)
Spoke with pt and made him aware of recommendation.  Pt appreciative for call.

## 2019-08-02 NOTE — Telephone Encounter (Signed)
-----   Message from Belva Crome, MD sent at 08/01/2019  6:32 PM EDT ----- Let the patient know LDL is above target of 70.  Triglyceride is above target of 130 or less.  Recommend that he start VASCEPA if he can afford it, 2 g twice daily.  Stop over-the-counter omega-3 fatty acid.  Repeat lipid panel in 3 months.  Months. A copy will be sent to Seward Carol, MD

## 2019-10-03 NOTE — Progress Notes (Signed)
Cardiology Office Note:    Date:  10/04/2019   ID:  Daniel Mendez, DOB December 17, 1940, MRN [id number]  PCP:  Seward Carol, MD  Cardiologist:  Sinclair Grooms, MD   Referring MD: Seward Carol, MD   Chief Complaint  Patient presents with  . Coronary Artery Disease  . Cardiac Valve Problem    History of Present Illness:    Daniel Mendez is a 78 y.o. male with a hx of  left carotid endarterectomy, moderate aortic stenosis, CADwith multivessel CABG October 2019 and surgical aortic valve replacement #25 bioprosthetic, essential hypertension, and hyperlipidemiaand recently noted to have hypercalcemia.  He is doing well.  He is having financial challenges taking Vascepa.  He has no chest pain, dyspnea, or other cardiac complaints.  Orthostatic dizziness that was previously present has resolved.  His blood pressures have been smoother at home and generally less than [id number] mmHg systolic.  He is having no particular medication side effects.  He is now 12 months following bypass surgery and aortic valve replacement.  Past Medical History:  Diagnosis Date  . Anginal pain ([place])   . Arthritis    hand- right  . Coronary artery disease   . Diabetes ([place])    type 2  . GERD (gastroesophageal reflux disease)   . Heart murmur    discovered in 2015 - followed by Dr. Daneen Schick  . History of hiatal hernia   . History of kidney stones   . HTN (hypertension)   . Hypercalcemia   . Hyperglyceridemia   . Hyperlipidemia   . Mild aortic stenosis Oct 2014  . Peripheral vascular disease ([place])   . Pneumonia 07/28/2018  . Shortness of breath dyspnea    on exertion  . Sleep apnea    no cpap    Past Surgical History:  Procedure Laterality Date  . AORTIC VALVE REPLACEMENT N/A 09/18/2018   Procedure: AORTIC VALVE REPLACEMENT (AVR) using inspiris valve - size 25;  Surgeon: Gaye Pollack, MD;  [facility];  Service: Open Heart Surgery;  Laterality: N/A;  . CATARACT EXTRACTION Bilateral    . COLONOSCOPY WITH PROPOFOL N/A 11/19/2014   Procedure: COLONOSCOPY WITH PROPOFOL;  Surgeon: Garlan Fair, MD;  [facility];  Service: Endoscopy;  Laterality: N/A;  . CORONARY ARTERY BYPASS GRAFT N/A 09/18/2018   Procedure: CORONARY ARTERY BYPASS GRAFTING (CABG) X 1 using right greater saphenous vein harvested endoscopically.;  Surgeon: Gaye Pollack, MD;  [facility];  Service: Open Heart Surgery;  Laterality: N/A;  . dental implants     dental post implanted to hold dentures/partials in place  . ENDARTERECTOMY Left 03/10/2015   Procedure: Left Carotid ENDARTERECTOMY ;  Surgeon: Rosetta Posner, MD;  Location: [place];  Service: Vascular;  Laterality: Left;  . FRACTURE SURGERY Left 1958   Left hand   . HERNIA REPAIR     Bilateral inguinal   . hernia repair b/l inguinal Bilateral   . knee arthroscopy left Left   . RIGHT/LEFT HEART CATH AND CORONARY ANGIOGRAPHY N/A 08/12/2017   Procedure: RIGHT/LEFT HEART CATH AND CORONARY ANGIOGRAPHY;  Surgeon: Belva Crome, MD;  Location: [place] [facility];  Service: Cardiovascular;  Laterality: N/A;  . RIGHT/LEFT HEART CATH AND CORONARY ANGIOGRAPHY N/A 09/01/2018   Procedure: RIGHT/LEFT HEART CATH AND CORONARY ANGIOGRAPHY;  Surgeon: Belva Crome, MD;  Location: [place] [facility];  Service: Cardiovascular;  Laterality: N/A;  . ROTATOR CUFF REPAIR    . TEE  WITHOUT CARDIOVERSION N/A 09/18/2018   Procedure: TRANSESOPHAGEAL ECHOCARDIOGRAM (TEE);  Surgeon: Gaye Pollack, MD;  Location: [place];  Service: Open Heart Surgery;  Laterality: N/A;  . VASECTOMY      Current Medications: Current Meds  Medication Sig  . amLODipine (NORVASC) 5 MG tablet Take 1 tablet (5 mg total) by mouth daily.  [name] Kitchen aspirin EC 81 MG tablet Take 1 tablet (81 mg total) by mouth daily.  [name] Kitchen atorvastatin (LIPITOR) 20 MG tablet Take 20 mg by mouth daily at 12 noon.   [name] Kitchen glimepiride (AMARYL) 4 MG tablet Take 4 mg by mouth daily with breakfast.  . metFORMIN (GLUCOPHAGE)  500 MG tablet Take 1,000 mg by mouth 2 (two) times daily.   . metoprolol succinate (TOPROL-XL) 25 MG 24 hr tablet Take 0.5 tablets (12.5 mg total) by mouth daily.  [name] Kitchen omeprazole (PRILOSEC) 20 MG capsule Take 20 mg by mouth every other day. At noon  . ONE TOUCH ULTRA TEST test strip 1 each by Other route daily.   Glory Rosebush DELICA LANCETS [id number] MISC 1 each by Other route daily.   . pramipexole (MIRAPEX) 0.125 MG tablet Take 0.125 mg by mouth daily.  . sitaGLIPtin (JANUVIA) 100 MG tablet Take 100 mg by mouth daily with lunch.   . tamsulosin (FLOMAX) 0.4 MG CAPS capsule Take 0.4 mg by mouth every evening.  [name] Kitchen telmisartan (MICARDIS) 40 MG tablet Take 1 tablet (40 mg total) by mouth daily.  [name] Kitchen testosterone cypionate (DEPOTESTOSTERONE CYPIONATE) 200 MG/ML injection Inject 200 mg into the muscle every 14 (fourteen) days.  . [DISCONTINUED] Icosapent Ethyl (VASCEPA) 1 g CAPS Take 2 capsules (2 g total) by mouth 2 (two) times daily.     Allergies:   Patient has no known allergies.   Social History   Socioeconomic History  . Marital status: Married    Spouse name: Not on file  . Number of children: Not on file  . Years of education: Not on file  . Highest education level: Not on file  Occupational History  . Not on file  Social Needs  . Financial resource strain: Not on file  . Food insecurity    Worry: Not on file    Inability: Not on file  . Transportation needs    Medical: Not on file    Non-medical: Not on file  Tobacco Use  . Smoking status: Never Smoker  . Smokeless tobacco: Never Used  Substance and Sexual Activity  . Alcohol use: Yes    Alcohol/week: 0.0 standard drinks    Comment: rarely  . Drug use: No  . Sexual activity: Yes    Birth control/protection: None  Lifestyle  . Physical activity    Days per week: Not on file    Minutes per session: Not on file  . Stress: Not on file  Relationships  . Social Herbalist on phone: Not on file    Gets together: Not on  file    Attends religious service: Not on file    Active member of club or organization: Not on file    Attends meetings of clubs or organizations: Not on file    Relationship status: Not on file  Other Topics Concern  . Not on file  Social History Narrative  . Not on file     Family History: The patient's family history includes CVA in his mother; Cancer in his daughter; Diabetes in his brother; Heart disease in his brother and father; Peripheral vascular  disease in his mother.  ROS:   Please see the history of present illness.    Stable without chills, fever, or heart failure complaints.  Vascepa is too expensive for him to continue to take.  All other systems reviewed and are negative.  EKGs/Labs/Other Studies Reviewed:    The following studies were reviewed today: No new data  EKG:  EKG not repeated  Recent Labs: 11/27/2018: Hemoglobin 9.4; Magnesium 1.7; Platelets 378; TSH 1.820 08/01/2019: ALT 29; BUN 18; Creatinine, Ser 1.54; Potassium 4.5; Sodium 135  Recent Lipid Panel    Component Value Date/Time   CHOL 151 08/01/2019 0738   TRIG 164 (H) 08/01/2019 0738   HDL 34 (L) 08/01/2019 0738   CHOLHDL 4.4 08/01/2019 0738   LDLCALC 88 08/01/2019 0738    Physical Exam:    VS:  BP (!) 148/74   Pulse 76   Ht 5\' 10"  (1.778 m)   Wt 185 lb (83.9 kg)   SpO2 94%   BMI 26.54 kg/m     Wt Readings from Last 3 Encounters:  10/04/19 185 lb (83.9 kg)  06/11/19 182 lb (82.6 kg)  02/26/19 179 lb (81.2 kg)     GEN: Appears younger than stated age. No acute distress HEENT: Normal NECK: No JVD. LYMPHATICS: No lymphadenopathy CARDIAC: 2/6 systolic crescendo decrescendo murmur.  RRR without diastolic murmur, gallop, or edema. VASCULAR:  Normal Pulses. No bruits. RESPIRATORY:  Clear to auscultation without rales, wheezing or rhonchi  ABDOMEN: Soft, non-tender, non-distended, No pulsatile mass, MUSCULOSKELETAL: No deformity  SKIN: Warm and dry NEUROLOGIC:  Alert and oriented x 3  PSYCHIATRIC:  Normal affect   ASSESSMENT:    1. S/P aortic valve replacement with bioprosthetic valve   2. CAD of autologous bypass graft   3. Essential hypertension, uncontrolled   4. Dyslipidemia   5. Diabetes mellitus type 2 with complications ([place])   6. Educated about COVID-19 virus infection    PLAN:    In order of problems listed above:  1. Normally functioning aortic valve prosthesis 2. Asymptomatic with reference to angina.  Secondary prevention discussed. 3. Blood pressure is slightly elevated today.  He monitors at home and ensures [place] that almost every blood pressure has been less than [id number] mmHg systolic 4. LDL is below 70.  We will also treating triglyceride with Vascepa but he cannot afford this because of excessive cost. 5. A1c is greater than 8.  He needs to be considered for SGLT2 therapy for better glycemic control and to add risk reduction for future cardiac events. 6. The 3W's is understood, and being practiced to prevent Covid infection.  Overall education and awareness concerning primary/secondary risk prevention was discussed in detail: LDL less than 70, hemoglobin A1c less than 7, blood pressure target less than 130/80 mmHg, >150 minutes of moderate aerobic activity per week, avoidance of smoking, weight control (via diet and exercise), and continued surveillance/management of/for obstructive sleep apnea.    Medication Adjustments/Labs and Tests Ordered: Current medicines are reviewed at length with the patient today.  Concerns regarding medicines are outlined above.  No orders of the defined types were placed in this encounter.  No orders of the defined types were placed in this encounter.   Patient Instructions  Medication Instructions:  1) DISCONTINUE Vascepa  *If you need a refill on your cardiac medications before your next appointment, please call your pharmacy*  Lab Work: None If you have labs (blood work) drawn today and your tests are completely  normal, you  will receive your results only by: [name] Kitchen MyChart Message (if you have MyChart) OR . A paper copy in the mail If you have any lab test that is abnormal or we need to change your treatment, we will call you to review the results.  Testing/Procedures: None  Follow-Up: At [facility], you and your health needs are our priority.  As part of our continuing mission to provide you with exceptional heart care, we have created designated Provider Care Teams.  These Care Teams include your primary Cardiologist (physician) and Advanced Practice Providers (APPs -  Physician Assistants and Nurse Practitioners) who all work together to provide you with the care you need, when you need it.  Your next appointment:   9 months  The format for your next appointment:   In Person  Provider:   You may see Sinclair Grooms, MD or one of the following Advanced Practice Providers on your designated Care Team:    Truitt Merle, NP  Cecilie Kicks, NP  Kathyrn Drown, NP   Other Instructions      Signed, Sinclair Grooms, MD  10/04/2019 4:01 PM    [place]

## 2019-10-04 ENCOUNTER — Ambulatory Visit (INDEPENDENT_AMBULATORY_CARE_PROVIDER_SITE_OTHER): Payer: Medicare Other | Admitting: Interventional Cardiology

## 2019-10-04 ENCOUNTER — Other Ambulatory Visit: Payer: Self-pay

## 2019-10-04 ENCOUNTER — Encounter: Payer: Self-pay | Admitting: Interventional Cardiology

## 2019-10-04 VITALS — BP 148/74 | HR 76 | Ht 70.0 in | Wt 185.0 lb

## 2019-10-04 DIAGNOSIS — Z7189 Other specified counseling: Secondary | ICD-10-CM

## 2019-10-04 DIAGNOSIS — Z953 Presence of xenogenic heart valve: Secondary | ICD-10-CM

## 2019-10-04 DIAGNOSIS — E785 Hyperlipidemia, unspecified: Secondary | ICD-10-CM

## 2019-10-04 DIAGNOSIS — I10 Essential (primary) hypertension: Secondary | ICD-10-CM

## 2019-10-04 DIAGNOSIS — E118 Type 2 diabetes mellitus with unspecified complications: Secondary | ICD-10-CM

## 2019-10-04 DIAGNOSIS — I25810 Atherosclerosis of coronary artery bypass graft(s) without angina pectoris: Secondary | ICD-10-CM | POA: Diagnosis not present

## 2019-10-04 NOTE — Patient Instructions (Signed)
Medication Instructions:  1) DISCONTINUE Vascepa  *If you need a refill on your cardiac medications before your next appointment, please call your pharmacy*  Lab Work: None If you have labs (blood work) drawn today and your tests are completely normal, you will receive your results only by: [name] Kitchen MyChart Message (if you have MyChart) OR . A paper copy in the mail If you have any lab test that is abnormal or we need to change your treatment, we will call you to review the results.  Testing/Procedures: None  Follow-Up: At [facility], you and your health needs are our priority.  As part of our continuing mission to provide you with exceptional heart care, we have created designated Provider Care Teams.  These Care Teams include your primary Cardiologist (physician) and Advanced Practice Providers (APPs -  Physician Assistants and Nurse Practitioners) who all work together to provide you with the care you need, when you need it.  Your next appointment:   9 months  The format for your next appointment:   In Person  Provider:   You may see Sinclair Grooms, MD or one of the following Advanced Practice Providers on your designated Care Team:    Truitt Merle, NP  Cecilie Kicks, NP  Kathyrn Drown, NP   Other Instructions

## 2019-10-25 ENCOUNTER — Encounter: Payer: Self-pay | Admitting: Dietician

## 2019-10-25 ENCOUNTER — Other Ambulatory Visit: Payer: Self-pay

## 2019-10-25 ENCOUNTER — Encounter: Payer: Medicare Other | Attending: Internal Medicine | Admitting: Dietician

## 2019-10-25 DIAGNOSIS — E118 Type 2 diabetes mellitus with unspecified complications: Secondary | ICD-10-CM

## 2019-10-25 NOTE — Patient Instructions (Addendum)
Consider diet sprite rather than diet coke. Continue to stay active.  Aim for at least 30 minutes most day. Are you eating a snack because you are hungry or bored or habit?  If you are bored or habit, what can you do instead of eating.  Aim for 4 Carb Choices per meal (60 grams) +/- 1 either way  Aim for 0-1 Carbs per snack if hungry  Include protein in moderation with your meals and snacks Consider reading food labels for Total Carbohydrate of foods Consider checking BG at alternate times per day  Consider taking medication as directed by MD

## 2019-10-25 NOTE — Progress Notes (Signed)
Diabetes Self-Management Education  Visit Type: First/Initial  Appt. Start Time: 1415 Appt. End Time: 5009  10/25/2019  Mr. Daniel Mendez, identified by name and date of birth, is a 78 y.o. male with a diagnosis of Diabetes: Type 2.   ASSESSMENT Patient is here today alone.   History includes type 2 diabetes Feb 09, 2006), HTN, HLD, CKD stage 3a, and CABGx1 and AVR 10/2018. He reports having a more challenging time controlling his BG in the past year despite making lifestyle changes. Medications include Ozempic (recently changed from [place]), glimepiride, and Metformin,  Labs include:  A1C 8.6% 10/03/2019 increased from 6.6 09/14/18  BUN 20, Creatinine 1.3, Potassium 4.6, eGFR 49, Cholesterol 145, HDL 28, LDL 39, Triglycerides 394 (patient reports triglycerides are always elevated).  Patient lives with his wife.  They both shop and she does most of the cooking. Retired from the state in vocational rehabilitation.  He was working prn for [employer] but not since covid as he is considered high risk.  His wife has increased orthopedic issues and they have a housekeeper.  Daughter passed away in 10-Feb-2023.   He drinks diet drinks.   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  Height '5\' 10"'  (1.778 m), weight 182 lb (82.6 kg). Body mass index is 26.11 kg/m.  Diabetes Self-Management Education - 10/25/19 1425      Visit Information   Visit Type  First/Initial      Initial Visit   Diabetes Type  Type 2    Are you currently following a meal plan?  No    Are you taking your medications as prescribed?  Yes    Date Diagnosed  09-Feb-2006      Health Coping   How would you rate your overall health?  Good      Psychosocial Assessment   Patient Belief/Attitude about Diabetes  Motivated to manage diabetes    Self-care barriers  None    Self-management support  Doctor's office;Family    Other persons present  Patient    Patient Concerns  Nutrition/Meal  planning;Glycemic Control    Special Needs  None    Preferred Learning Style  No preference indicated    Learning Readiness  Ready    How often do you need to have someone help you when you read instructions, pamphlets, or other written materials from your doctor or pharmacy?  1 - Never    What is the last grade level you completed in school?  Master's degree      Pre-Education Assessment   Patient understands the diabetes disease and treatment process.  Needs Review    Patient understands incorporating nutritional management into lifestyle.  Needs Review    Patient undertands incorporating physical activity into lifestyle.  Needs Review    Patient understands using medications safely.  Needs Review    Patient understands monitoring blood glucose, interpreting and using results  Needs Review    Patient understands prevention, detection, and treatment of acute complications.  Needs Review    Patient understands prevention, detection, and treatment of chronic complications.  Needs Review    Patient understands how to develop strategies to address psychosocial issues.  Needs Review    Patient understands how to develop strategies to promote health/change behavior.  Needs Review      Complications   Last HgB A1C per patient/outside source  8.6 %   10/03/2019 increased from 6.6% 09/14/2018   How often do you check  your blood sugar?  1-2 times/day    Fasting Blood glucose range (mg/dL)  130-179   160-170   Number of hypoglycemic episodes per month  1    Can you tell when your blood sugar is low?  Yes   "see things"   What do you do if your blood sugar is low?  eat a pack of crackers    Have you had a dilated eye exam in the past 12 months?  Yes    Have you had a dental exam in the past 12 months?  No   dentures   Are you checking your feet?  Yes    How many days per week are you checking your feet?  1      Dietary Intake   Breakfast  Oatmeal with brown splenda, strawberries or other  fruit, cinnamon OR cereal (raisin bran) banana OR occasional egg, [employer] toast    Snack (morning)  spoon of Pimento cheese    Lunch  homemade vegetable beef soup OR sandwich    Snack (afternoon)  spoon of pimento cheese, fresh fruit "Quit Little Debbie Oatmeal Cookie"    Dinner  chicken or fish, rice or potatoes, vegetables    Snack (evening)  Rare low fat ice cream    Beverage(s)  coffee with skim milk and splenda, water, Coconut/pineapple zero sugar drink, lemon water, diet coke (1 glass per day), unsweetened tea      Exercise   Exercise Type  Light (walking / raking leaves)   [employer]   How many days per week to you exercise?  5    How many minutes per day do you exercise?  75    Total minutes per week of exercise  375      Patient Education   Previous Diabetes Education  Yes (please comment)   he cannot remember   Disease state   Definition of diabetes, type 1 and 2, and the diagnosis of diabetes    Nutrition management   Role of diet in the treatment of diabetes and the relationship between the three main macronutrients and blood glucose level;Meal options for control of blood glucose level and chronic complications.;Meal timing in regards to the patients' current diabetes medication.    Physical activity and exercise   Role of exercise on diabetes management, blood pressure control and cardiac health.    Medications  Reviewed patients medication for diabetes, action, purpose, timing of dose and side effects.    Monitoring  Purpose and frequency of SMBG.;Identified appropriate SMBG and/or A1C goals.;Daily foot exams;Yearly dilated eye exam    Acute complications  Taught treatment of hypoglycemia - the 15 rule.;Discussed and identified patients' treatment of hyperglycemia.    Chronic complications  Relationship between chronic complications and blood glucose control;Identified and discussed with patient  current chronic complications;Retinopathy and reason for yearly dilated eye exams     Psychosocial adjustment  Role of stress on diabetes;Identified and addressed patients feelings and concerns about diabetes      Individualized Goals (developed by patient)   Nutrition  General guidelines for healthy choices and portions discussed    Physical Activity  Exercise 5-7 days per week;60 minutes per day    Medications  take my medication as prescribed    Monitoring   test my blood glucose as discussed    Reducing Risk  increase portions of healthy fats    Health Coping  discuss diabetes with (comment)   MD, RD, CDE     Post-Education  Assessment   Patient understands the diabetes disease and treatment process.  Demonstrates understanding / competency    Patient understands incorporating nutritional management into lifestyle.  Demonstrates understanding / competency    Patient undertands incorporating physical activity into lifestyle.  Demonstrates understanding / competency    Patient understands using medications safely.  Demonstrates understanding / competency    Patient understands monitoring blood glucose, interpreting and using results  Demonstrates understanding / competency    Patient understands prevention, detection, and treatment of acute complications.  Demonstrates understanding / competency    Patient understands prevention, detection, and treatment of chronic complications.  Demonstrates understanding / competency    Patient understands how to develop strategies to address psychosocial issues.  Demonstrates understanding / competency    Patient understands how to develop strategies to promote health/change behavior.  Demonstrates understanding / competency      Outcomes   Expected Outcomes  Demonstrated interest in learning. Expect positive outcomes    Future DMSE  PRN    Program Status  Completed       Individualized Plan for Diabetes Self-Management Training:   Learning Objective:  Patient will have a greater understanding of diabetes self-management. Patient  education plan is to attend individual and/or group sessions per assessed needs and concerns.   Plan:   Patient Instructions  Consider diet sprite rather than diet coke. Continue to stay active.  Aim for at least 30 minutes most day. Are you eating a snack because you are hungry or bored or habit?  If you are bored or habit, what can you do instead of eating.  Aim for 4 Carb Choices per meal (60 grams) +/- 1 either way  Aim for 0-1 Carbs per snack if hungry  Include protein in moderation with your meals and snacks Consider reading food labels for Total Carbohydrate of foods Consider checking BG at alternate times per day  Consider taking medication as directed by MD      Expected Outcomes:  Demonstrated interest in learning. Expect positive outcomes  Education material provided: ADA - How to Thrive: A Guide for Your Journey with Diabetes, Meal plan card and Snack sheet  If problems or questions, patient to contact team via:  Phone and Email  Future DSME appointment: PRN

## 2020-02-25 DIAGNOSIS — M810 Age-related osteoporosis without current pathological fracture: Secondary | ICD-10-CM | POA: Diagnosis not present

## 2020-02-25 DIAGNOSIS — E781 Pure hyperglyceridemia: Secondary | ICD-10-CM | POA: Diagnosis not present

## 2020-02-25 DIAGNOSIS — N400 Enlarged prostate without lower urinary tract symptoms: Secondary | ICD-10-CM | POA: Diagnosis not present

## 2020-02-25 DIAGNOSIS — E1165 Type 2 diabetes mellitus with hyperglycemia: Secondary | ICD-10-CM | POA: Diagnosis not present

## 2020-02-25 DIAGNOSIS — I2510 Atherosclerotic heart disease of native coronary artery without angina pectoris: Secondary | ICD-10-CM | POA: Diagnosis not present

## 2020-02-25 DIAGNOSIS — E1122 Type 2 diabetes mellitus with diabetic chronic kidney disease: Secondary | ICD-10-CM | POA: Diagnosis not present

## 2020-02-25 DIAGNOSIS — I10 Essential (primary) hypertension: Secondary | ICD-10-CM | POA: Diagnosis not present

## 2020-02-25 DIAGNOSIS — E782 Mixed hyperlipidemia: Secondary | ICD-10-CM | POA: Diagnosis not present

## 2020-02-25 DIAGNOSIS — D509 Iron deficiency anemia, unspecified: Secondary | ICD-10-CM | POA: Diagnosis not present

## 2020-04-10 ENCOUNTER — Other Ambulatory Visit: Payer: Self-pay

## 2020-04-10 MED ORDER — AMLODIPINE BESYLATE 5 MG PO TABS: 5.0000 mg | ORAL_TABLET | Freq: Every day | ORAL | 1 refills | Status: DC

## 2020-04-22 DIAGNOSIS — M8580 Other specified disorders of bone density and structure, unspecified site: Secondary | ICD-10-CM | POA: Diagnosis not present

## 2020-04-22 DIAGNOSIS — I10 Essential (primary) hypertension: Secondary | ICD-10-CM | POA: Diagnosis not present

## 2020-04-22 DIAGNOSIS — G2581 Restless legs syndrome: Secondary | ICD-10-CM | POA: Diagnosis not present

## 2020-04-22 DIAGNOSIS — K219 Gastro-esophageal reflux disease without esophagitis: Secondary | ICD-10-CM | POA: Diagnosis not present

## 2020-04-22 DIAGNOSIS — E782 Mixed hyperlipidemia: Secondary | ICD-10-CM | POA: Diagnosis not present

## 2020-04-22 DIAGNOSIS — E118 Type 2 diabetes mellitus with unspecified complications: Secondary | ICD-10-CM | POA: Diagnosis not present

## 2020-04-22 DIAGNOSIS — E291 Testicular hypofunction: Secondary | ICD-10-CM | POA: Diagnosis not present

## 2020-04-29 ENCOUNTER — Other Ambulatory Visit: Payer: Self-pay

## 2020-04-29 MED ORDER — METOPROLOL SUCCINATE ER 25 MG PO TB24: 12.5000 mg | ORAL_TABLET | Freq: Every day | ORAL | 1 refills | Status: AC

## 2020-05-29 IMAGING — DX DG CHEST 1V PORT
1 series · 1 of 1 positions shown · non-contrast
Comparison: Radiographs September 14, 2018.

CLINICAL DATA: Status post coronary artery bypass graft.

EXAM:
PORTABLE CHEST 1 VIEW

[chest]
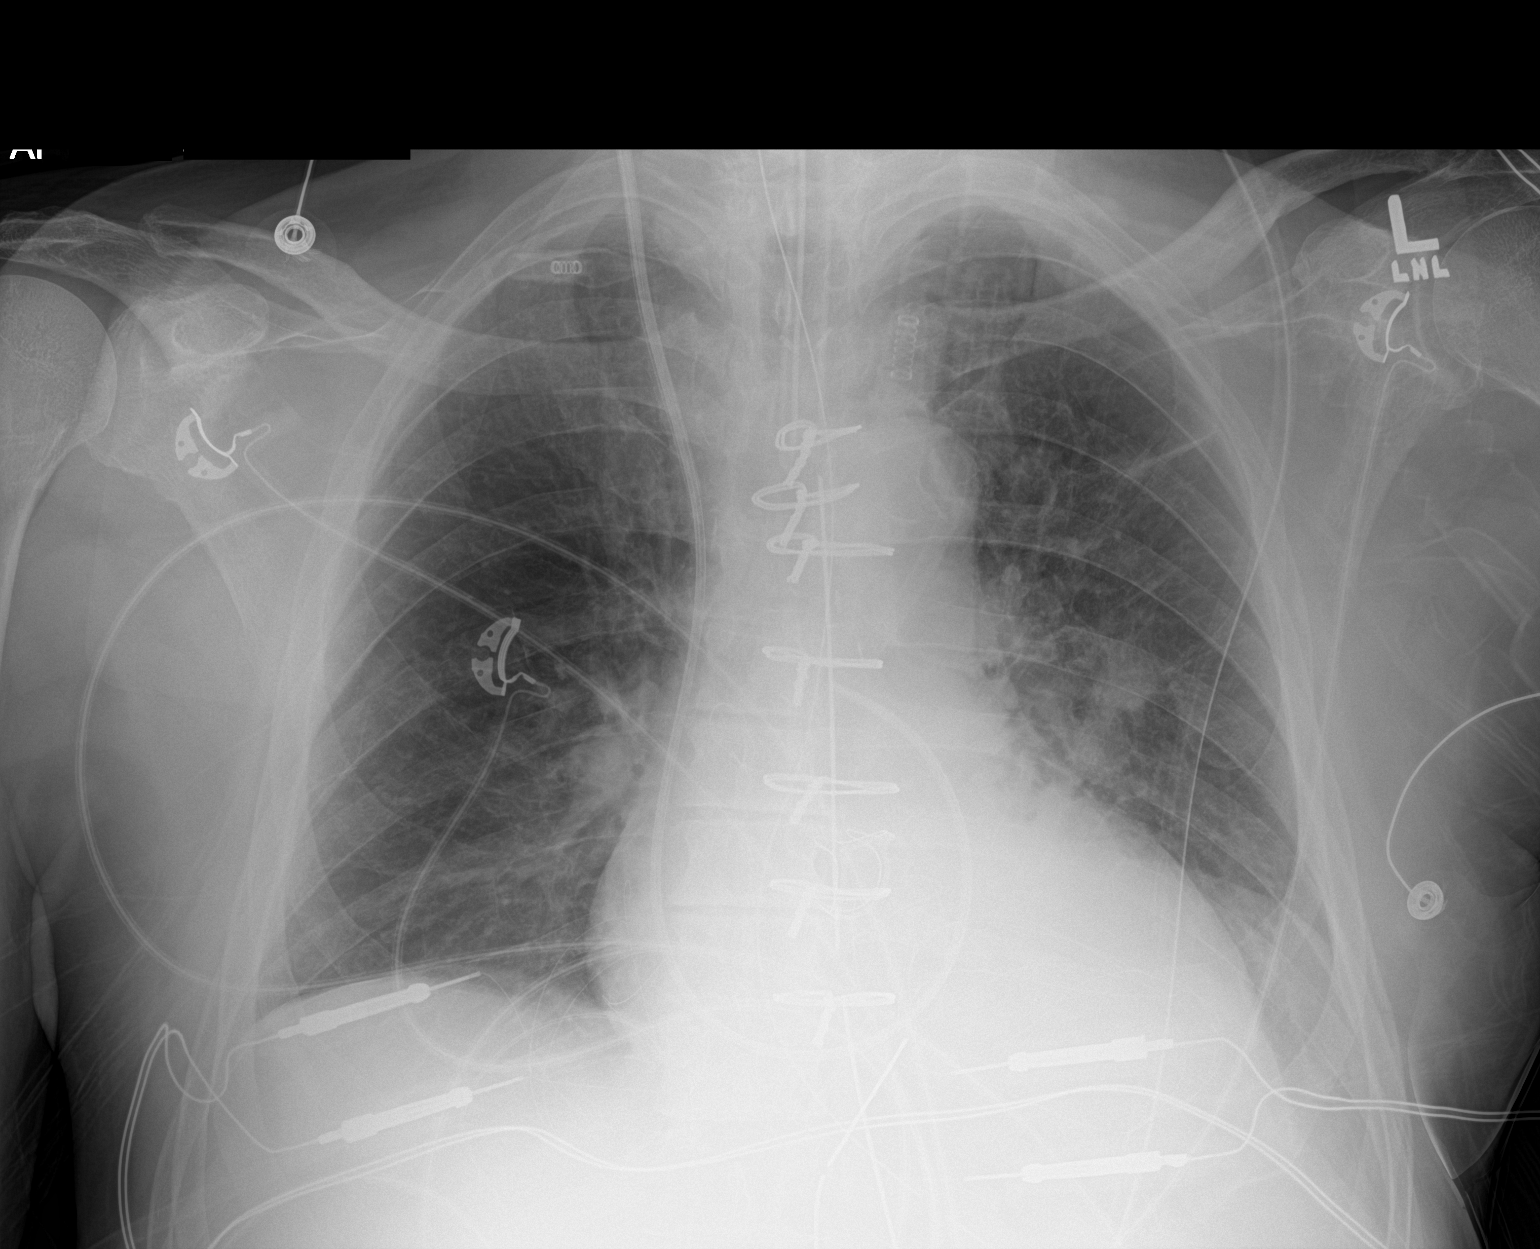

[1 of 1 positions shown; findings below may reference images not displayed]

FINDINGS: Stable cardiomediastinal silhouette. Status post aortic valve
replacement. Endotracheal tube appears to be in good position.
Nasogastric tube is noted with tip near gastroesophageal junction,
and side hole in distal esophagus. Right internal jugular Swan-Ganz
catheter is noted with tip directed toward right pulmonary artery.
No pneumothorax is noted. Right lung is clear. Mild left basilar
subsegmental atelectasis is noted. No significant pleural effusion
is noted. Bony thorax is unremarkable.
IMPRESSION: Status post aortic valve replacement. Endotracheal tube and
Swan-Ganz catheter in good position. Nasogastric tube tip appears to
be near gastroesophageal junction, and advancement is recommended.
Mild left basilar atelectasis is noted. No pneumothorax is seen.

## 2020-05-30 IMAGING — DX DG CHEST 1V PORT
1 series · 1 of 1 positions shown · non-contrast
Comparison: Radiograph September 18, 2018.

CLINICAL DATA: Status post coronary artery bypass graft.

EXAM:
PORTABLE CHEST 1 VIEW

[chest]
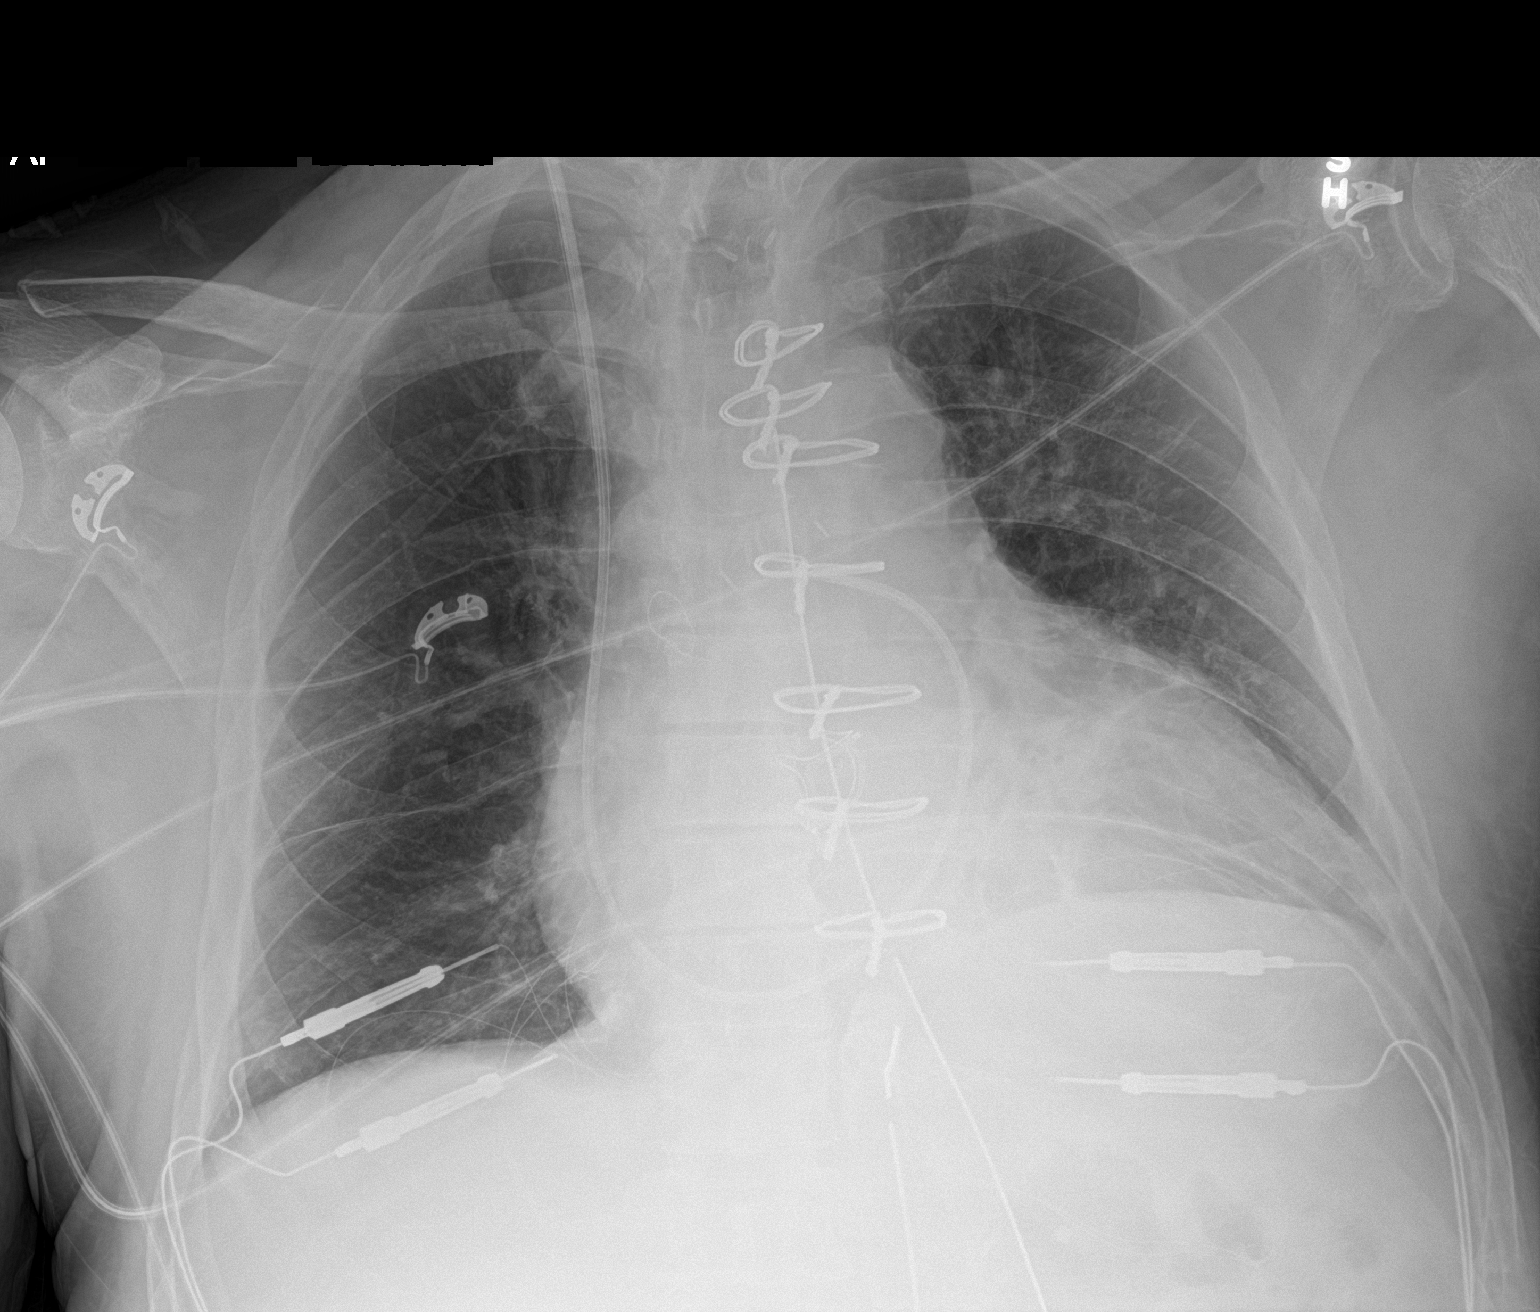

[1 of 1 positions shown; findings below may reference images not displayed]

FINDINGS: Endotracheal and nasogastric tubes have been removed. Right internal
jugular Swan-Ganz catheter is unchanged in position. Stable
cardiomegaly. No pneumothorax or pleural effusion is noted. Right
lung is clear. Mild left basilar subsegmental atelectasis is noted.
Bony thorax is unremarkable.
IMPRESSION: Endotracheal and nasogastric tubes have been removed. No
pneumothorax is noted. Stable left basilar subsegmental atelectasis.

## 2020-06-09 DIAGNOSIS — M8580 Other specified disorders of bone density and structure, unspecified site: Secondary | ICD-10-CM | POA: Diagnosis not present

## 2020-06-09 DIAGNOSIS — M85861 Other specified disorders of bone density and structure, right lower leg: Secondary | ICD-10-CM | POA: Diagnosis not present

## 2020-06-11 ENCOUNTER — Telehealth: Payer: Self-pay | Admitting: Interventional Cardiology

## 2020-06-11 NOTE — Telephone Encounter (Signed)
Patient has moved to [place], [place] and will need his records faxed to his new Cardiologist  Dr. Wyatt Mage at [facility] at [facility]. The office phone number is [phone]. Records can be faxed to them at [phone]

## 2020-07-02 DIAGNOSIS — I6523 Occlusion and stenosis of bilateral carotid arteries: Secondary | ICD-10-CM | POA: Diagnosis not present

## 2020-07-02 DIAGNOSIS — Z952 Presence of prosthetic heart valve: Secondary | ICD-10-CM | POA: Diagnosis not present

## 2020-07-02 DIAGNOSIS — Z8249 Family history of ischemic heart disease and other diseases of the circulatory system: Secondary | ICD-10-CM | POA: Diagnosis not present

## 2020-07-02 DIAGNOSIS — I2510 Atherosclerotic heart disease of native coronary artery without angina pectoris: Secondary | ICD-10-CM | POA: Diagnosis not present

## 2020-09-22 DIAGNOSIS — E119 Type 2 diabetes mellitus without complications: Secondary | ICD-10-CM | POA: Diagnosis not present

## 2020-09-22 DIAGNOSIS — Z0000 Encounter for general adult medical examination without abnormal findings: Secondary | ICD-10-CM | POA: Diagnosis not present

## 2020-09-22 DIAGNOSIS — H903 Sensorineural hearing loss, bilateral: Secondary | ICD-10-CM | POA: Diagnosis not present

## 2020-09-22 DIAGNOSIS — Z125 Encounter for screening for malignant neoplasm of prostate: Secondary | ICD-10-CM | POA: Diagnosis not present

## 2020-09-24 DIAGNOSIS — R7989 Other specified abnormal findings of blood chemistry: Secondary | ICD-10-CM | POA: Diagnosis not present

## 2020-09-30 DIAGNOSIS — H524 Presbyopia: Secondary | ICD-10-CM | POA: Diagnosis not present

## 2020-09-30 DIAGNOSIS — E119 Type 2 diabetes mellitus without complications: Secondary | ICD-10-CM | POA: Diagnosis not present

## 2020-10-02 DIAGNOSIS — H903 Sensorineural hearing loss, bilateral: Secondary | ICD-10-CM | POA: Diagnosis not present

## 2020-10-02 DIAGNOSIS — N138 Other obstructive and reflux uropathy: Secondary | ICD-10-CM | POA: Diagnosis not present

## 2020-10-02 DIAGNOSIS — N529 Male erectile dysfunction, unspecified: Secondary | ICD-10-CM | POA: Diagnosis not present

## 2020-10-02 DIAGNOSIS — H9313 Tinnitus, bilateral: Secondary | ICD-10-CM | POA: Diagnosis not present

## 2020-10-02 DIAGNOSIS — N401 Enlarged prostate with lower urinary tract symptoms: Secondary | ICD-10-CM | POA: Diagnosis not present

## 2020-10-02 DIAGNOSIS — R7989 Other specified abnormal findings of blood chemistry: Secondary | ICD-10-CM | POA: Diagnosis not present

## 2020-10-02 DIAGNOSIS — Z87438 Personal history of other diseases of male genital organs: Secondary | ICD-10-CM | POA: Diagnosis not present

## 2020-10-02 DIAGNOSIS — Z79899 Other long term (current) drug therapy: Secondary | ICD-10-CM | POA: Diagnosis not present

## 2020-10-21 ENCOUNTER — Other Ambulatory Visit: Payer: Self-pay | Admitting: Interventional Cardiology

## 2020-12-09 DIAGNOSIS — Z952 Presence of prosthetic heart valve: Secondary | ICD-10-CM | POA: Diagnosis not present

## 2020-12-09 DIAGNOSIS — I2510 Atherosclerotic heart disease of native coronary artery without angina pectoris: Secondary | ICD-10-CM | POA: Diagnosis not present

## 2020-12-15 DIAGNOSIS — M19012 Primary osteoarthritis, left shoulder: Secondary | ICD-10-CM | POA: Diagnosis not present

## 2020-12-15 DIAGNOSIS — M7542 Impingement syndrome of left shoulder: Secondary | ICD-10-CM | POA: Diagnosis not present

## 2020-12-23 DIAGNOSIS — E119 Type 2 diabetes mellitus without complications: Secondary | ICD-10-CM | POA: Diagnosis not present

## 2020-12-23 DIAGNOSIS — I10 Essential (primary) hypertension: Secondary | ICD-10-CM | POA: Diagnosis not present

## 2020-12-23 DIAGNOSIS — E118 Type 2 diabetes mellitus with unspecified complications: Secondary | ICD-10-CM | POA: Diagnosis not present

## 2020-12-31 DIAGNOSIS — M810 Age-related osteoporosis without current pathological fracture: Secondary | ICD-10-CM | POA: Diagnosis not present

## 2020-12-31 DIAGNOSIS — Z8639 Personal history of other endocrine, nutritional and metabolic disease: Secondary | ICD-10-CM | POA: Diagnosis not present

## 2021-01-02 DIAGNOSIS — Z8639 Personal history of other endocrine, nutritional and metabolic disease: Secondary | ICD-10-CM | POA: Diagnosis not present

## 2021-01-12 DIAGNOSIS — E213 Hyperparathyroidism, unspecified: Secondary | ICD-10-CM | POA: Diagnosis not present

## 2021-01-12 DIAGNOSIS — R490 Dysphonia: Secondary | ICD-10-CM | POA: Diagnosis not present

## 2021-01-12 DIAGNOSIS — E210 Primary hyperparathyroidism: Secondary | ICD-10-CM | POA: Diagnosis not present

## 2021-01-15 DIAGNOSIS — M47812 Spondylosis without myelopathy or radiculopathy, cervical region: Secondary | ICD-10-CM | POA: Diagnosis not present

## 2021-01-15 DIAGNOSIS — E213 Hyperparathyroidism, unspecified: Secondary | ICD-10-CM | POA: Diagnosis not present

## 2021-01-15 DIAGNOSIS — M4802 Spinal stenosis, cervical region: Secondary | ICD-10-CM | POA: Diagnosis not present

## 2021-01-16 DIAGNOSIS — Z01818 Encounter for other preprocedural examination: Secondary | ICD-10-CM | POA: Diagnosis not present

## 2021-01-16 DIAGNOSIS — R490 Dysphonia: Secondary | ICD-10-CM | POA: Diagnosis not present

## 2021-01-16 DIAGNOSIS — Z9889 Other specified postprocedural states: Secondary | ICD-10-CM | POA: Diagnosis not present

## 2021-01-16 DIAGNOSIS — J383 Other diseases of vocal cords: Secondary | ICD-10-CM | POA: Diagnosis not present

## 2021-01-16 DIAGNOSIS — J300 Vasomotor rhinitis: Secondary | ICD-10-CM | POA: Diagnosis not present

## 2021-02-13 DIAGNOSIS — E291 Testicular hypofunction: Secondary | ICD-10-CM | POA: Diagnosis not present

## 2021-02-13 DIAGNOSIS — N183 Chronic kidney disease, stage 3 unspecified: Secondary | ICD-10-CM | POA: Diagnosis not present

## 2021-02-13 DIAGNOSIS — Z01818 Encounter for other preprocedural examination: Secondary | ICD-10-CM | POA: Diagnosis not present

## 2021-02-13 DIAGNOSIS — I9789 Other postprocedural complications and disorders of the circulatory system, not elsewhere classified: Secondary | ICD-10-CM | POA: Diagnosis not present

## 2021-02-13 DIAGNOSIS — I6522 Occlusion and stenosis of left carotid artery: Secondary | ICD-10-CM | POA: Diagnosis not present

## 2021-02-13 DIAGNOSIS — I4891 Unspecified atrial fibrillation: Secondary | ICD-10-CM | POA: Diagnosis not present

## 2021-02-13 DIAGNOSIS — G2581 Restless legs syndrome: Secondary | ICD-10-CM | POA: Diagnosis not present

## 2021-02-13 DIAGNOSIS — I129 Hypertensive chronic kidney disease with stage 1 through stage 4 chronic kidney disease, or unspecified chronic kidney disease: Secondary | ICD-10-CM | POA: Diagnosis not present

## 2021-02-13 DIAGNOSIS — E213 Hyperparathyroidism, unspecified: Secondary | ICD-10-CM | POA: Diagnosis not present

## 2021-02-13 DIAGNOSIS — E782 Mixed hyperlipidemia: Secondary | ICD-10-CM | POA: Diagnosis not present

## 2021-02-13 DIAGNOSIS — E1122 Type 2 diabetes mellitus with diabetic chronic kidney disease: Secondary | ICD-10-CM | POA: Diagnosis not present

## 2021-02-13 DIAGNOSIS — I2510 Atherosclerotic heart disease of native coronary artery without angina pectoris: Secondary | ICD-10-CM | POA: Diagnosis not present

## 2021-02-25 DIAGNOSIS — D351 Benign neoplasm of parathyroid gland: Secondary | ICD-10-CM | POA: Diagnosis not present

## 2021-02-25 DIAGNOSIS — E782 Mixed hyperlipidemia: Secondary | ICD-10-CM | POA: Diagnosis not present

## 2021-02-25 DIAGNOSIS — I129 Hypertensive chronic kidney disease with stage 1 through stage 4 chronic kidney disease, or unspecified chronic kidney disease: Secondary | ICD-10-CM | POA: Diagnosis not present

## 2021-02-25 DIAGNOSIS — E210 Primary hyperparathyroidism: Secondary | ICD-10-CM | POA: Diagnosis not present

## 2021-02-25 DIAGNOSIS — N183 Chronic kidney disease, stage 3 unspecified: Secondary | ICD-10-CM | POA: Diagnosis not present

## 2021-02-25 DIAGNOSIS — K219 Gastro-esophageal reflux disease without esophagitis: Secondary | ICD-10-CM | POA: Diagnosis not present

## 2021-02-25 DIAGNOSIS — E1122 Type 2 diabetes mellitus with diabetic chronic kidney disease: Secondary | ICD-10-CM | POA: Diagnosis not present

## 2021-02-25 DIAGNOSIS — I2510 Atherosclerotic heart disease of native coronary artery without angina pectoris: Secondary | ICD-10-CM | POA: Diagnosis not present

## 2021-02-25 DIAGNOSIS — G2581 Restless legs syndrome: Secondary | ICD-10-CM | POA: Diagnosis not present

## 2021-03-13 DIAGNOSIS — E210 Primary hyperparathyroidism: Secondary | ICD-10-CM | POA: Diagnosis not present

## 2021-03-23 DIAGNOSIS — K062 Gingival and edentulous alveolar ridge lesions associated with trauma: Secondary | ICD-10-CM | POA: Diagnosis not present

## 2021-03-23 DIAGNOSIS — E119 Type 2 diabetes mellitus without complications: Secondary | ICD-10-CM | POA: Diagnosis not present

## 2021-03-23 DIAGNOSIS — I10 Essential (primary) hypertension: Secondary | ICD-10-CM | POA: Diagnosis not present

## 2021-03-23 DIAGNOSIS — E892 Postprocedural hypoparathyroidism: Secondary | ICD-10-CM | POA: Diagnosis not present

## 2021-03-23 DIAGNOSIS — E118 Type 2 diabetes mellitus with unspecified complications: Secondary | ICD-10-CM | POA: Diagnosis not present

## 2021-03-31 DIAGNOSIS — N401 Enlarged prostate with lower urinary tract symptoms: Secondary | ICD-10-CM | POA: Diagnosis not present

## 2021-03-31 DIAGNOSIS — N529 Male erectile dysfunction, unspecified: Secondary | ICD-10-CM | POA: Diagnosis not present

## 2021-03-31 DIAGNOSIS — N138 Other obstructive and reflux uropathy: Secondary | ICD-10-CM | POA: Diagnosis not present

## 2021-03-31 DIAGNOSIS — R7989 Other specified abnormal findings of blood chemistry: Secondary | ICD-10-CM | POA: Diagnosis not present

## 2021-05-04 DIAGNOSIS — M85851 Other specified disorders of bone density and structure, right thigh: Secondary | ICD-10-CM | POA: Diagnosis not present

## 2021-05-04 DIAGNOSIS — Z8639 Personal history of other endocrine, nutritional and metabolic disease: Secondary | ICD-10-CM | POA: Diagnosis not present

## 2021-05-19 DIAGNOSIS — U071 COVID-19: Secondary | ICD-10-CM | POA: Diagnosis not present
